# Patient Record
Sex: Female | Born: 1945 | Race: Black or African American | Hispanic: No | Marital: Single | Smoking: Never smoker
Health system: Southern US, Community
[De-identification: ages and names within clinical notes are randomized; demographics above are authoritative.]

## PROBLEM LIST (undated history)

## (undated) DIAGNOSIS — M1990 Unspecified osteoarthritis, unspecified site: Secondary | ICD-10-CM

## (undated) DIAGNOSIS — I10 Essential (primary) hypertension: Secondary | ICD-10-CM

## (undated) DIAGNOSIS — G5603 Carpal tunnel syndrome, bilateral upper limbs: Secondary | ICD-10-CM

## (undated) DIAGNOSIS — B91 Sequelae of poliomyelitis: Secondary | ICD-10-CM

## (undated) DIAGNOSIS — M89669 Osteopathy after poliomyelitis, unspecified lower leg: Secondary | ICD-10-CM

## (undated) DIAGNOSIS — G709 Myoneural disorder, unspecified: Secondary | ICD-10-CM

## (undated) DIAGNOSIS — IMO0002 Reserved for concepts with insufficient information to code with codable children: Secondary | ICD-10-CM

## (undated) DIAGNOSIS — M549 Dorsalgia, unspecified: Secondary | ICD-10-CM

## (undated) DIAGNOSIS — G629 Polyneuropathy, unspecified: Secondary | ICD-10-CM

## (undated) DIAGNOSIS — K219 Gastro-esophageal reflux disease without esophagitis: Secondary | ICD-10-CM

## (undated) DIAGNOSIS — H269 Unspecified cataract: Secondary | ICD-10-CM

## (undated) DIAGNOSIS — H409 Unspecified glaucoma: Secondary | ICD-10-CM

## (undated) DIAGNOSIS — E785 Hyperlipidemia, unspecified: Secondary | ICD-10-CM

## (undated) DIAGNOSIS — M62838 Other muscle spasm: Secondary | ICD-10-CM

## (undated) DIAGNOSIS — G8929 Other chronic pain: Secondary | ICD-10-CM

## (undated) HISTORY — PX: BREAST BIOPSY: SHX20

## (undated) HISTORY — DX: Unspecified osteoarthritis, unspecified site: M19.90

## (undated) HISTORY — PX: HAND SURGERY: SHX662

## (undated) HISTORY — DX: Hyperlipidemia, unspecified: E78.5

## (undated) HISTORY — DX: Osteopathy after poliomyelitis, unspecified lower leg: M89.669

## (undated) HISTORY — PX: COLONOSCOPY: SHX174

## (undated) HISTORY — PX: CHOLECYSTECTOMY: SHX55

## (undated) HISTORY — DX: Essential (primary) hypertension: I10

## (undated) HISTORY — DX: Sequelae of poliomyelitis: B91

## (undated) HISTORY — DX: Myoneural disorder, unspecified: G70.9

## (undated) HISTORY — PX: OTHER SURGICAL HISTORY: SHX169

## (undated) HISTORY — DX: Gastro-esophageal reflux disease without esophagitis: K21.9

## (undated) HISTORY — DX: Carpal tunnel syndrome, bilateral upper limbs: G56.03

## (undated) HISTORY — DX: Reserved for concepts with insufficient information to code with codable children: IMO0002

---
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
## 1978-04-08 HISTORY — PX: ABDOMINAL HYSTERECTOMY: SHX81

## 1997-09-09 ENCOUNTER — Other Ambulatory Visit: Admission: RE | Admit: 1997-09-09 | Discharge: 1997-09-09 | Payer: Self-pay | Admitting: Family Medicine

## 1997-09-12 ENCOUNTER — Other Ambulatory Visit: Admission: RE | Admit: 1997-09-12 | Discharge: 1997-09-12 | Payer: Self-pay | Admitting: Family Medicine

## 1998-12-28 ENCOUNTER — Other Ambulatory Visit: Admission: RE | Admit: 1998-12-28 | Discharge: 1998-12-28 | Payer: Self-pay | Admitting: Family Medicine

## 1999-09-21 ENCOUNTER — Encounter: Payer: Self-pay | Admitting: Family Medicine

## 1999-09-21 ENCOUNTER — Ambulatory Visit (HOSPITAL_COMMUNITY): Admission: RE | Admit: 1999-09-21 | Discharge: 1999-09-21 | Payer: Self-pay | Admitting: Family Medicine

## 1999-11-01 ENCOUNTER — Ambulatory Visit (HOSPITAL_COMMUNITY): Admission: RE | Admit: 1999-11-01 | Discharge: 1999-11-01 | Payer: Self-pay | Admitting: Family Medicine

## 1999-11-01 ENCOUNTER — Encounter: Payer: Self-pay | Admitting: Family Medicine

## 1999-11-27 ENCOUNTER — Encounter: Admission: RE | Admit: 1999-11-27 | Discharge: 1999-12-28 | Payer: Self-pay | Admitting: Orthopedic Surgery

## 2000-05-10 ENCOUNTER — Encounter: Payer: Self-pay | Admitting: Neurosurgery

## 2000-05-10 ENCOUNTER — Ambulatory Visit (HOSPITAL_COMMUNITY): Admission: RE | Admit: 2000-05-10 | Discharge: 2000-05-10 | Payer: Self-pay | Admitting: Neurosurgery

## 2000-05-13 ENCOUNTER — Encounter: Payer: Self-pay | Admitting: Neurosurgery

## 2000-05-13 ENCOUNTER — Ambulatory Visit (HOSPITAL_COMMUNITY): Admission: RE | Admit: 2000-05-13 | Discharge: 2000-05-13 | Payer: Self-pay | Admitting: Neurosurgery

## 2000-05-27 ENCOUNTER — Encounter: Payer: Self-pay | Admitting: Neurosurgery

## 2000-05-27 ENCOUNTER — Ambulatory Visit (HOSPITAL_COMMUNITY): Admission: RE | Admit: 2000-05-27 | Discharge: 2000-05-27 | Payer: Self-pay | Admitting: Neurosurgery

## 2000-06-10 ENCOUNTER — Ambulatory Visit (HOSPITAL_COMMUNITY): Admission: RE | Admit: 2000-06-10 | Discharge: 2000-06-10 | Payer: Self-pay | Admitting: Neurosurgery

## 2000-06-10 ENCOUNTER — Encounter: Payer: Self-pay | Admitting: Neurosurgery

## 2001-10-01 ENCOUNTER — Encounter: Admission: RE | Admit: 2001-10-01 | Discharge: 2001-10-01 | Payer: Self-pay | Admitting: Neurosurgery

## 2001-10-01 ENCOUNTER — Encounter: Payer: Self-pay | Admitting: Neurosurgery

## 2001-10-15 ENCOUNTER — Encounter: Admission: RE | Admit: 2001-10-15 | Discharge: 2001-10-15 | Payer: Self-pay | Admitting: Neurosurgery

## 2001-10-15 ENCOUNTER — Encounter: Payer: Self-pay | Admitting: Neurosurgery

## 2001-10-28 ENCOUNTER — Encounter: Admission: RE | Admit: 2001-10-28 | Discharge: 2001-10-28 | Payer: Self-pay | Admitting: Neurosurgery

## 2001-10-28 ENCOUNTER — Encounter: Payer: Self-pay | Admitting: Neurosurgery

## 2002-03-09 ENCOUNTER — Other Ambulatory Visit: Admission: RE | Admit: 2002-03-09 | Discharge: 2002-03-09 | Payer: Self-pay | Admitting: Family Medicine

## 2004-01-30 ENCOUNTER — Ambulatory Visit: Payer: Self-pay | Admitting: Family Medicine

## 2004-02-14 ENCOUNTER — Ambulatory Visit: Payer: Self-pay | Admitting: Family Medicine

## 2004-02-24 ENCOUNTER — Ambulatory Visit (HOSPITAL_COMMUNITY): Admission: RE | Admit: 2004-02-24 | Discharge: 2004-02-24 | Payer: Self-pay | Admitting: Family Medicine

## 2004-03-14 ENCOUNTER — Ambulatory Visit: Payer: Self-pay | Admitting: Family Medicine

## 2004-04-08 HISTORY — PX: JOINT REPLACEMENT: SHX530

## 2004-04-19 ENCOUNTER — Encounter: Admission: RE | Admit: 2004-04-19 | Discharge: 2004-04-19 | Payer: Self-pay | Admitting: Neurosurgery

## 2004-05-03 ENCOUNTER — Encounter: Admission: RE | Admit: 2004-05-03 | Discharge: 2004-05-03 | Payer: Self-pay | Admitting: Neurosurgery

## 2004-05-21 ENCOUNTER — Ambulatory Visit: Payer: Self-pay | Admitting: Family Medicine

## 2004-05-23 ENCOUNTER — Ambulatory Visit (HOSPITAL_COMMUNITY): Admission: RE | Admit: 2004-05-23 | Discharge: 2004-05-23 | Payer: Self-pay | Admitting: Family Medicine

## 2004-06-05 ENCOUNTER — Encounter: Admission: RE | Admit: 2004-06-05 | Discharge: 2004-07-06 | Payer: Self-pay | Admitting: Family Medicine

## 2004-08-07 ENCOUNTER — Ambulatory Visit: Payer: Self-pay | Admitting: Family Medicine

## 2004-12-24 ENCOUNTER — Inpatient Hospital Stay (HOSPITAL_COMMUNITY): Admission: RE | Admit: 2004-12-24 | Discharge: 2004-12-28 | Payer: Self-pay | Admitting: Orthopedic Surgery

## 2005-01-11 ENCOUNTER — Emergency Department (HOSPITAL_COMMUNITY): Admission: EM | Admit: 2005-01-11 | Discharge: 2005-01-11 | Payer: Self-pay | Admitting: Emergency Medicine

## 2005-01-16 ENCOUNTER — Encounter: Admission: RE | Admit: 2005-01-16 | Discharge: 2005-02-26 | Payer: Self-pay | Admitting: Orthopedic Surgery

## 2005-03-14 ENCOUNTER — Encounter: Admission: RE | Admit: 2005-03-14 | Discharge: 2005-03-26 | Payer: Self-pay | Admitting: Orthopedic Surgery

## 2005-04-23 ENCOUNTER — Ambulatory Visit (HOSPITAL_COMMUNITY): Admission: RE | Admit: 2005-04-23 | Discharge: 2005-04-23 | Payer: Self-pay | Admitting: Neurosurgery

## 2005-05-10 ENCOUNTER — Encounter: Admission: RE | Admit: 2005-05-10 | Discharge: 2005-05-10 | Payer: Self-pay | Admitting: Neurosurgery

## 2005-05-24 ENCOUNTER — Encounter: Admission: RE | Admit: 2005-05-24 | Discharge: 2005-05-24 | Payer: Self-pay | Admitting: Neurosurgery

## 2005-05-28 ENCOUNTER — Ambulatory Visit: Payer: Self-pay | Admitting: Family Medicine

## 2006-01-02 ENCOUNTER — Ambulatory Visit: Payer: Self-pay | Admitting: Family Medicine

## 2006-01-13 ENCOUNTER — Encounter: Admission: RE | Admit: 2006-01-13 | Discharge: 2006-02-06 | Payer: Self-pay | Admitting: Family Medicine

## 2006-03-05 ENCOUNTER — Ambulatory Visit: Payer: Self-pay | Admitting: Family Medicine

## 2006-08-05 ENCOUNTER — Ambulatory Visit: Payer: Self-pay | Admitting: Family Medicine

## 2006-09-30 ENCOUNTER — Ambulatory Visit: Payer: Self-pay | Admitting: Family Medicine

## 2007-04-23 ENCOUNTER — Ambulatory Visit: Payer: Self-pay | Admitting: Family Medicine

## 2007-04-23 LAB — CONVERTED CEMR LAB: Microalb, Ur: 0.89 mg/dL (ref 0.00–1.89)

## 2007-04-30 ENCOUNTER — Ambulatory Visit (HOSPITAL_COMMUNITY): Admission: RE | Admit: 2007-04-30 | Discharge: 2007-04-30 | Payer: Self-pay | Admitting: Family Medicine

## 2007-07-14 ENCOUNTER — Encounter: Admission: RE | Admit: 2007-07-14 | Discharge: 2007-10-01 | Payer: Self-pay | Admitting: Orthopedic Surgery

## 2007-10-23 ENCOUNTER — Ambulatory Visit: Payer: Self-pay | Admitting: Internal Medicine

## 2007-10-30 ENCOUNTER — Ambulatory Visit (HOSPITAL_COMMUNITY): Admission: RE | Admit: 2007-10-30 | Discharge: 2007-10-30 | Payer: Self-pay | Admitting: Family Medicine

## 2008-01-22 ENCOUNTER — Ambulatory Visit: Payer: Self-pay | Admitting: Internal Medicine

## 2008-06-23 ENCOUNTER — Ambulatory Visit: Payer: Self-pay | Admitting: Family Medicine

## 2008-06-24 ENCOUNTER — Encounter (INDEPENDENT_AMBULATORY_CARE_PROVIDER_SITE_OTHER): Payer: Self-pay | Admitting: Family Medicine

## 2008-06-24 LAB — CONVERTED CEMR LAB: Microalb, Ur: 1.74 mg/dL (ref 0.00–1.89)

## 2008-08-02 ENCOUNTER — Ambulatory Visit: Payer: Self-pay | Admitting: Family Medicine

## 2008-08-09 ENCOUNTER — Ambulatory Visit: Payer: Self-pay | Admitting: Gastroenterology

## 2008-08-18 ENCOUNTER — Ambulatory Visit: Payer: Self-pay | Admitting: Gastroenterology

## 2008-09-09 ENCOUNTER — Encounter (INDEPENDENT_AMBULATORY_CARE_PROVIDER_SITE_OTHER): Payer: Self-pay | Admitting: Family Medicine

## 2008-09-09 ENCOUNTER — Other Ambulatory Visit: Admission: RE | Admit: 2008-09-09 | Discharge: 2008-09-09 | Payer: Self-pay | Admitting: Family Medicine

## 2008-09-09 ENCOUNTER — Ambulatory Visit: Payer: Self-pay | Admitting: Family Medicine

## 2008-09-09 LAB — CONVERTED CEMR LAB
ALT: 19 units/L (ref 0–35)
AST: 16 units/L (ref 0–37)
Albumin: 4.4 g/dL (ref 3.5–5.2)
Alkaline Phosphatase: 64 units/L (ref 39–117)
BUN: 12 mg/dL (ref 6–23)
Basophils Absolute: 0 10*3/uL (ref 0.0–0.1)
Basophils Relative: 1 % (ref 0–1)
CO2: 27 meq/L (ref 19–32)
Calcium: 9.9 mg/dL (ref 8.4–10.5)
Chloride: 104 meq/L (ref 96–112)
Cholesterol: 145 mg/dL (ref 0–200)
Creatinine, Ser: 0.8 mg/dL (ref 0.40–1.20)
Eosinophils Absolute: 0.2 10*3/uL (ref 0.0–0.7)
Eosinophils Relative: 3 % (ref 0–5)
Glucose, Bld: 101 mg/dL — ABNORMAL HIGH (ref 70–99)
HCT: 39.8 % (ref 36.0–46.0)
HDL: 57 mg/dL (ref >39)
Hemoglobin: 12.5 g/dL (ref 12.0–15.0)
LDL Cholesterol: 67 mg/dL (ref 0–99)
Lymphocytes Relative: 47 % — ABNORMAL HIGH (ref 12–46)
Lymphs Abs: 2.4 10*3/uL (ref 0.7–4.0)
MCHC: 31.4 g/dL (ref 30.0–36.0)
MCV: 79.3 fL (ref 78.0–100.0)
Monocytes Absolute: 0.3 10*3/uL (ref 0.1–1.0)
Monocytes Relative: 6 % (ref 3–12)
Neutro Abs: 2.2 10*3/uL (ref 1.7–7.7)
Neutrophils Relative %: 43 % (ref 43–77)
Platelets: 292 10*3/uL (ref 150–400)
Potassium: 5 meq/L (ref 3.5–5.3)
RBC: 5.02 M/uL (ref 3.87–5.11)
RDW: 15.4 % (ref 11.5–15.5)
Sodium: 141 meq/L (ref 135–145)
TSH: 1.174 microintl units/mL (ref 0.350–4.500)
Total Bilirubin: 0.4 mg/dL (ref 0.3–1.2)
Total CHOL/HDL Ratio: 2.5
Total Protein: 7.1 g/dL (ref 6.0–8.3)
Triglycerides: 106 mg/dL (ref <150)
VLDL: 21 mg/dL (ref 0–40)
Vit D, 25-Hydroxy: 12 ng/mL — ABNORMAL LOW (ref 30–89)
WBC: 5 10*3/uL (ref 4.0–10.5)

## 2008-09-09 LAB — HM PAP SMEAR: HM Pap smear: NORMAL

## 2009-02-28 LAB — HM MAMMOGRAPHY: HM Mammogram: NORMAL

## 2009-03-28 ENCOUNTER — Ambulatory Visit: Payer: Self-pay | Admitting: Family Medicine

## 2009-03-28 LAB — CONVERTED CEMR LAB
BUN: 10 mg/dL (ref 6–23)
CO2: 27 meq/L (ref 19–32)
Calcium: 10.1 mg/dL (ref 8.4–10.5)
Chloride: 104 meq/L (ref 96–112)
Cholesterol: 135 mg/dL (ref 0–200)
Creatinine, Ser: 0.71 mg/dL (ref 0.40–1.20)
Glucose, Bld: 91 mg/dL (ref 70–99)
HDL: 58 mg/dL (ref >39)
LDL Cholesterol: 57 mg/dL (ref 0–99)
Potassium: 4.8 meq/L (ref 3.5–5.3)
Sodium: 141 meq/L (ref 135–145)
Total CHOL/HDL Ratio: 2.3
Triglycerides: 99 mg/dL (ref <150)
VLDL: 20 mg/dL (ref 0–40)
Vit D, 25-Hydroxy: 28 ng/mL — ABNORMAL LOW (ref 30–89)

## 2009-05-26 ENCOUNTER — Ambulatory Visit: Payer: Self-pay | Admitting: Family Medicine

## 2009-10-20 ENCOUNTER — Ambulatory Visit: Payer: Self-pay | Admitting: Family Medicine

## 2009-10-20 LAB — CONVERTED CEMR LAB: Microalb, Ur: 1.69 mg/dL (ref 0.00–1.89)

## 2009-10-25 ENCOUNTER — Ambulatory Visit: Payer: Self-pay | Admitting: Family Medicine

## 2010-01-05 ENCOUNTER — Emergency Department (HOSPITAL_COMMUNITY): Admission: EM | Admit: 2010-01-05 | Discharge: 2010-01-05 | Payer: Self-pay | Admitting: Emergency Medicine

## 2010-01-12 ENCOUNTER — Encounter: Admission: RE | Admit: 2010-01-12 | Discharge: 2010-01-12 | Payer: Self-pay | Admitting: Neurosurgery

## 2010-02-21 ENCOUNTER — Encounter
Admission: RE | Admit: 2010-02-21 | Discharge: 2010-04-04 | Payer: Self-pay | Source: Home / Self Care | Attending: Orthopedic Surgery | Admitting: Orthopedic Surgery

## 2010-03-28 ENCOUNTER — Encounter: Admission: RE | Admit: 2010-03-28 | Payer: Self-pay | Source: Home / Self Care | Admitting: Orthopedic Surgery

## 2010-04-09 ENCOUNTER — Encounter: Admit: 2010-04-09 | Payer: Self-pay | Admitting: Orthopedic Surgery

## 2010-04-11 ENCOUNTER — Encounter
Admission: RE | Admit: 2010-04-11 | Discharge: 2010-05-08 | Payer: Self-pay | Source: Home / Self Care | Attending: Orthopedic Surgery | Admitting: Orthopedic Surgery

## 2010-04-25 ENCOUNTER — Encounter: Admit: 2010-04-25 | Payer: Self-pay | Admitting: Orthopedic Surgery

## 2010-04-26 ENCOUNTER — Encounter (INDEPENDENT_AMBULATORY_CARE_PROVIDER_SITE_OTHER): Payer: Self-pay | Admitting: Family Medicine

## 2010-04-26 LAB — CONVERTED CEMR LAB
Calcium: 10 mg/dL (ref 8.4–10.5)
Cholesterol: 166 mg/dL (ref 0–200)
Glucose, Bld: 97 mg/dL (ref 70–99)
HDL: 52 mg/dL (ref >39)
LDL Cholesterol: 88 mg/dL (ref 0–99)
Potassium: 4.4 meq/L (ref 3.5–5.3)
Sodium: 139 meq/L (ref 135–145)
Total CHOL/HDL Ratio: 3.2
Triglycerides: 128 mg/dL (ref <150)
VLDL: 26 mg/dL (ref 0–40)

## 2010-04-26 LAB — HEMOGLOBIN A1C: Hgb A1c MFr Bld: 6.6 % — AB (ref 4.0–6.0)

## 2010-04-26 LAB — BASIC METABOLIC PANEL
BUN: 9 mg/dL (ref 4–21)
Glucose: 97 mg/dL

## 2010-06-20 ENCOUNTER — Inpatient Hospital Stay (INDEPENDENT_AMBULATORY_CARE_PROVIDER_SITE_OTHER)
Admission: RE | Admit: 2010-06-20 | Discharge: 2010-06-20 | Disposition: A | Discharge status: Home / Self Care | Payer: Medicare HMO | Source: Ambulatory Visit | Attending: Family Medicine | Admitting: Family Medicine

## 2010-06-20 DIAGNOSIS — M67919 Unspecified disorder of synovium and tendon, unspecified shoulder: Secondary | ICD-10-CM

## 2010-07-17 LAB — GLUCOSE, CAPILLARY
Glucose-Capillary: 118 mg/dL — ABNORMAL HIGH (ref 70–99)
Glucose-Capillary: 124 mg/dL — ABNORMAL HIGH (ref 70–99)

## 2010-08-01 ENCOUNTER — Ambulatory Visit (INDEPENDENT_AMBULATORY_CARE_PROVIDER_SITE_OTHER): Payer: Medicaid Other | Admitting: Family Medicine

## 2010-08-01 ENCOUNTER — Encounter: Payer: Self-pay | Admitting: Family Medicine

## 2010-08-01 VITALS — BP 123/74 | HR 69 | Temp 98.2°F | Ht 63.0 in | Wt 222.0 lb

## 2010-08-01 DIAGNOSIS — I10 Essential (primary) hypertension: Secondary | ICD-10-CM

## 2010-08-01 DIAGNOSIS — E119 Type 2 diabetes mellitus without complications: Secondary | ICD-10-CM

## 2010-08-01 DIAGNOSIS — M549 Dorsalgia, unspecified: Secondary | ICD-10-CM

## 2010-08-01 DIAGNOSIS — M25519 Pain in unspecified shoulder: Secondary | ICD-10-CM

## 2010-08-01 DIAGNOSIS — E785 Hyperlipidemia, unspecified: Secondary | ICD-10-CM

## 2010-08-01 DIAGNOSIS — M25512 Pain in left shoulder: Secondary | ICD-10-CM

## 2010-08-01 MED ORDER — TRAMADOL HCL 50 MG PO TABS: 50.0000 mg | ORAL_TABLET | Freq: Two times a day (BID) | ORAL | Status: AC | PRN

## 2010-08-01 MED ORDER — OMEPRAZOLE 20 MG PO CPDR: 20.0000 mg | DELAYED_RELEASE_CAPSULE | Freq: Two times a day (BID) | ORAL | Status: DC

## 2010-08-01 MED ORDER — NAPROXEN 500 MG PO TABS: 500.0000 mg | ORAL_TABLET | Freq: Two times a day (BID) | ORAL | Status: DC

## 2010-08-01 NOTE — Progress Notes (Signed)
  Subjective:    Patient ID: [name], female    DOB: [birth year], 65 y.o.   MRN: [id number]  HPI  Previously at [facility] with Dr. Garnett Farm , pt insurance was changed and she no longer can be seen there. Here to establish today as a pt. Concerns: Bilateral shoulder pain, medication refills, will discuss other problems at next visit          Diabetes Mellitus- Takes CBG twice a day when she remembers, tolerating Metformin, does not not her last A1C, had blood work in Jan with previous PCP. Taking ACE as prescribed, has had a few low CBG near 70, no CBG > 200.  HTN- Tolerating BP meds, no recent CP, SOB  Shoulder pain- bilateral shoulder pain, L > R x 3 months, aching sensation over both deltoid region,sharp pain that radiates from biceps groove upward No specific injury  Concerned she had overuse of the left arm, when she was rehab for her right hand s/p fall in Oct 2011 Seen was at [facility] at [facility] told probable rotator cuff injury- given Naproxen for 10 days Pain with reaching above, able to dress herself without significant  Pain, feels week in both arms but weaker on left side, has some pins and needles in hands but feels this is mostly her carpal tunnel.           Medication rec done - needs refill on Prilosec    Review of Systems - per above     Objective:   Physical Exam   GEN- NAD, alert and oriented, pleasant, obese, walks with right limp, cane   CVS- RRR, no murmur  Neck supple , normal ROM  RESP- CTAB, normal effort  MSK:-   Shoulder: Inspection reveals no abnormalities, atrophy or asymmetry. Bilaterally  TTP over left bicipital groove. Decreased ROM with extension above head, discomfort with lateral motion on left side Positive empty can on left side Positive neers positive Hawkins Gross weakness on Left UE 4/5 compared to right. Mild discomfort with external rotation with rotator cuff testing + speeds, neg yeargerson equivical Obrien's, negative clunk and  good stability. Normal scapular function observed. No painful arc and no drop arm sign.    Pulses 2+ radial Skin- well healed  Linear surgical scar Right wrist    Assessment & Plan:

## 2010-08-01 NOTE — Patient Instructions (Addendum)
I will send a referral to the sports medicine center for your shoulder - I am concerned for bursitis and possible small tear in shoulder  Stop the diclofenac Start there naprosyn twice a day Use the pain medication- Ultram as needed I will obtain medical records [facility] Next visit in 3-4 weeks  Call to see what meter you need for your diabetes

## 2010-08-03 ENCOUNTER — Encounter: Payer: Self-pay | Admitting: Family Medicine

## 2010-08-03 DIAGNOSIS — E785 Hyperlipidemia, unspecified: Secondary | ICD-10-CM | POA: Insufficient documentation

## 2010-08-03 DIAGNOSIS — H409 Unspecified glaucoma: Secondary | ICD-10-CM | POA: Insufficient documentation

## 2010-08-03 DIAGNOSIS — M5410 Radiculopathy, site unspecified: Secondary | ICD-10-CM | POA: Insufficient documentation

## 2010-08-03 DIAGNOSIS — M79602 Pain in left arm: Secondary | ICD-10-CM | POA: Insufficient documentation

## 2010-08-03 DIAGNOSIS — E1142 Type 2 diabetes mellitus with diabetic polyneuropathy: Secondary | ICD-10-CM | POA: Insufficient documentation

## 2010-08-03 DIAGNOSIS — I10 Essential (primary) hypertension: Secondary | ICD-10-CM | POA: Insufficient documentation

## 2010-08-03 NOTE — Assessment & Plan Note (Signed)
Concern for bursitis and possible small slow healing rotator cuff tear. Will change pt NSAID, start Ultram Send to [facility] for ultrasound

## 2010-08-03 NOTE — Assessment & Plan Note (Signed)
Obtain records from previous PCP Maintained on [facility] Pt to call back with the name of a new meter that her insurance will cover, all the ones I offered she stated they did not cover

## 2010-08-03 NOTE — Assessment & Plan Note (Signed)
Continue current meds 

## 2010-08-09 ENCOUNTER — Other Ambulatory Visit: Payer: Medicare HMO | Admitting: Family Medicine

## 2010-08-10 ENCOUNTER — Encounter: Payer: Self-pay | Admitting: Family Medicine

## 2010-08-10 ENCOUNTER — Ambulatory Visit (INDEPENDENT_AMBULATORY_CARE_PROVIDER_SITE_OTHER): Payer: Medicare HMO | Admitting: Family Medicine

## 2010-08-10 DIAGNOSIS — M25519 Pain in unspecified shoulder: Secondary | ICD-10-CM

## 2010-08-10 DIAGNOSIS — S46219A Strain of muscle, fascia and tendon of other parts of biceps, unspecified arm, initial encounter: Secondary | ICD-10-CM

## 2010-08-10 DIAGNOSIS — S43499A Other sprain of unspecified shoulder joint, initial encounter: Secondary | ICD-10-CM

## 2010-08-10 MED ORDER — NITROGLYCERIN 0.1 MG/HR TD PT24: MEDICATED_PATCH | TRANSDERMAL | Status: DC

## 2010-08-10 NOTE — Progress Notes (Signed)
Subjective:    Patient ID: [name], female    DOB: 10-Nov-1945, 65 y.o.   MRN: [id number]  HPI 65 year old right-hand-dominant female to office for evaluation of bilateral shoulder pain that has been ongoing for the past 3 months. Pain is worse in the left  compared to the right. Pain is typically anterior lateral in position and described as an ache, but occasional sharp pain anteriorly. She has increased pain with overhead activity and trying to lift items. She has occasional nighttime pain. She denies any neck pain. She denies any injury or trauma to her shoulders, although did have a fall in October 2011 where sustained a right wrist fracture requiring ORIF by Dr. Mina Marble.  Has been evaluated at urgent care but that she had a rotator cuff injury, she was referred to [place] by her PCP for ultrasound of the shoulders. Has been taking Naprosyn and tramadol which have been helpful. Denies any previous injury to her shoulders. Occasional numbness and tingling in the fingers of her left hand, has history of carpal tunnel.  Past Medical History  Diagnosis Date  . Diabetes mellitus   . GERD (gastroesophageal reflux disease)   . Hyperlipidemia   . Hypertension   . 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 Arthritis     osteoarthritis  . Back pain, chronic     per pt disc disease, disc herniation- Followed by Dr. Mikal Plane  . Carpal tunnel syndrome on both sides     Followed by Dr. Mina Marble  . Glaucoma   . Trigger finger   . Polio osteopathy of lower leg 1948   Past Surgical History  Procedure Date  . Abdominal hysterectomy   . Cholecystectomy   . Hand surgery     Right wrist, s/p plate removal in Feb 2012  . Joint replacement 2006    left knee    Review of Systems  per history of present illness, otherwise negative     Objective:   Physical Exam GEN: NAD, alert and oriented, pleasant, obese, walks with assistance of a cane SKIN: No rashes or  lesions RESP: Respirations 15 and unlabored MSK: - Neck: FROM without pain.  No midline or paraspinal tenderness.  Neg Spurlings b/l - Shoulders:  Inspection reveals no abnormalities, atrophy or asymmetry.  Near FROM with exception of decreased IR to L4 bilaterally, (+)painful arch on left.  Extremely TTP over left bicipital groove & AC joint, minimal TTP over right bicipital groove.  (+) empty can, (+)Hawkins, (+)Neer, (+)Speeds & [place] on left.  (-) Obriens, (-) Clunk, no instability.  No drop arm.  Mild weakness of left RTC in abduciton & ER.  Rt shoulder with neg empty can, mildly (+) Neer, neg Hawkins, neg Obriens, no instability.  Normal RTC strength on the right.  No scapular dyskinesia. NEURO: sensation intact to light touch, DTR +2/4 bicep, tricep, brachioradialis VASC: pulse +2/4 & symmetric radial artery b/l  MSK U/S:  - Left shoulder: Bicep tendon with hypoechoic change & surrounding edema with (+)target sign, increased doppler flow & neovessels noted; visualized in both long & short views - finding consistent with partial bicep tendon tear.  Normal appearing subscap, normal appearing supraspinatus, normal appearing infraspinatus.  Mild DJD at [facility] joint.  No dynamic impingement. - Right shoulder: Normal appearing bicep.  Small calcification noted along articular surface of subscap, no increased doppler flow, possibly old partial RTC tear.  Normal appearing supraspinatus.  Normal appearing infraspinatus.  No dynamic impingement.  Mild DJD at [facility]. - Images saved.       Assessment & Plan:

## 2010-08-10 NOTE — Patient Instructions (Signed)
Shoulder exercises:  1) pendulum exercises - clockwise and counter-clockwise both arms twice a day  2) wall crawl - both arms twice a day  3) biceps exercises left arm only - 1-2 lbs down slow up fast. Do 3 sets of 15 for several weeks and if no pain at that time advance to 3 sets of 30   Nitroglycerin patch: cut patch in half and apply to tender area of shoulder. Leave patch on for 24hrs then remove and replace with another one. If you develop a headache you can take Tylenol.  Most common side effect is headache - which if occurs will usually in first 2-weeks of treatment.  May also get a rash.  Continue to take Ultram and Naproxen as directed  Follow-up in 6-weeks for repeat ultrasound

## 2010-08-10 NOTE — Assessment & Plan Note (Signed)
-   Bilateral shoulder pain with partial biceps tear on left and suspected rotator cuff strain on the right. - Relatively normal MSK ultrasound on right today with exception of a.c. joint DJD and possible old rotator cuff tear of the supraspinatus. - Start pendulum exercises and wall crawls for both shoulders, plan to advance to strengthening exercises in the future - Continue naproxen and Ultram as prescribed - Followup 6 weeks for reevaluation

## 2010-08-10 NOTE — Assessment & Plan Note (Addendum)
-   Small partial tearing of proximal left biceps tendon with increased Doppler flow and neovessels noted on MSK ultrasound today - Patient's symptoms have been ongoing for 3 months, will start her on nitroglycerin protocol, explained common side effects including rash/headache - She should start bicep exercises with light weight as described in pt instructions -  Continue naproxen and Ultram as needed as prescribed by PCP - Followup 6 weeks for repeat ultrasound and reevaluation

## 2010-08-15 ENCOUNTER — Encounter: Payer: Self-pay | Admitting: Family Medicine

## 2010-08-20 ENCOUNTER — Encounter: Payer: Self-pay | Admitting: Family Medicine

## 2010-08-24 NOTE — Discharge Summary (Signed)
[name], [name] NO.:  [id number]   MEDICAL RECORD NO.:  [id number]          PATIENT TYPE:  INP   LOCATION:  5017                         FACILITY:  [facility]   PHYSICIAN:  [name], M.D. DATE OF BIRTH:  05-12-1945   DATE OF ADMISSION:  12/24/2004  DATE OF DISCHARGE:  12/28/2004                                 DISCHARGE SUMMARY   ADMISSION DIAGNOSIS:  1.  End stage osteoarthritis of the left knee.  2.  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  3.  Diabetes mellitus.  4.  Hypertension, uncontrolled.   DISCHARGE DIAGNOSIS:  1.  Status post left total knee arthroplasty.  2.  Acute blood loss anemia secondary to surgery, asymptomatic.  3.  Postop pyrexia with negative workup.  4.  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  5.  Diabetes mellitus.  6.  Hypertension, uncontrolled.   HISTORY OF PRESENT ILLNESS:  [name] is a 65 year old African American  female with a history of post polio syndrome with problems with her left  knee.  The patient states that over the past three years, she has had  progressively worsening pain in the left knee.  She describes the pain as a  sharp, occasional stabbing pain.  She has typically transitioning from  sitting to standing position.  Pain is in the medial aspect of the left knee  with radiation down the tibia.  Mechanical symptoms positive for locking and  giving way.  She does have night pain.  X-rays of the left knee show bone on  bone medial compartment with large osteophytes along the femoral tibial  components of the left knee.   ALLERGIES:  No known drug allergies.   CURRENT MEDICATIONS:  Famotidine 20 mg p.o. b.i.d., Altace 2.5 mg daily,  Metformin 500 mg p.o. b.i.d., Clarinex 5 mg p.o. daily, Nasonex 2 sprays  each nostril q.h.s. p.r.n., Norflex 100 mg b.i.d.   SURGICAL PROCEDURE:  The patient was taken to the operating room December 24, 2004, by Dr. Georgena Perez  assisted by [name], P.A.-C.  The patient  was placed under general anesthesia and received a preop femoral nerve  block.  The patient then underwent a left total knee arthroplasty using the  following components, size E femoral component, size 3 tibial component, 10  mm bearing, and an all poly 35 mm thickness patella.  The patient tolerated  the procedure well and returned to the recovery room in good, stable  condition.   CONSULTATIONS:  PT, OT, case management.   HOSPITAL COURSE:  Postop day one, patient afebrile, vital signs stable, H&H  10 and 29.9.  Patient felt sleepy secondary to pain medications but had no  symptoms of anemia.  PCA was discontinued.  The patient was started on oral  pain medications.  Postop day two, T-max 101.4, blood pressure 149/91, heart  rate 109, respiratory rate 20, 97% O2 saturation on room air.  H&H 9.8 and  29.  Patient complained of poor pain control and OxyContin was started.  The  patient denied shortness of breath, nausea and vomiting, chest  pain.  The  patient seemed much more alert on postop day two compared to postop day one.  The patient's white count was 11,100, aggressive pulmonary toilet was  instituted.  Postop day three, patient with better pain control.  Denied  chest pain, shortness of breath, nausea, vomiting, dysuria.  No dizziness or  symptoms of anemia.  H&H 9.4 and 28.4, white count 10,400.  The patient with  T-max 103, otherwise, vital signs stable.  Chest x-ray was obtained, UA  obtained, blood cultures obtained.  Continued aggressive pulmonary toilet.  Postop day four, patient T-max 102.7, white count dropped to 7200, UA  negative, chest x-ray no active disease, blood cultures pending.  On  examination, chest clear to auscultation bilaterally, some very mild  wheezing noted.  At the point of morning rounds, physical exam was unable to  be performed due to the fact patient on bedpan.  Patient's exam done later  that  afternoon, at that time, temperature 99.7.  The patient was to remain  hospitalized until 16 hours and if she remained afebrile, was to be  discharged.  The patient was discharged home on December 28, 2004.   LABORATORY DATA:  Routine CBC on admission December 18, 2004, white count  6,300, hemoglobin 12.7, hematocrit 38.3, platelets 289.  Coags on December 18, 2004, protime 12.9, INR 1, PTT 36.  Routine chemistries on December 18, 2004, sodium 139, potassium 4.2, chloride 107, bicarb 27, glucose 95, BUN  low at 5, creatinine 0.8, calcium 9.6.  Routine hepatic enzymes on admission  reveals all values within normal limits.  Urinalysis on admission December 18, 2004, was negative.  Repeat urinalysis on December 27, 2004, negative.  Blood cultures obtained December 27, 2004, negative x 5 days.   DISCHARGE MEDICATIONS:  The patient is to resume home meds and add the  following:  Iron 325 1 tab daily x 28 days, Percocet 5/325 1-2 tablets q.4-  6h. p.r.n. pain, OxyContin 10 mg SR 1 tablet q.12h. pain, Lovenox 40 mg 1  injection daily at 10 a.m. for ten days.   DISCHARGE INSTRUCTIONS:  Wound care:  The patient is to keep the wound clean  and dry.  The patient is to call the office for temperature greater than  101.5, pain not controlled, or any signs of infection.  Change dressing  daily.  The patient may shower after two days if no drainage.  Weight-  bearing as tolerated with a walker.  Diet:  Modified carb diet.  Special  instructions:  CPM 0 to 90 degrees 6-8 hours a day, increase by 10 degrees  daily.  Home Health PT per [facility].  Follow up:  The patient needs to follow  up with Dr. Sherlean Perez approximately ten days from discharge, the patient is to  call the office at [phone] for appointment.   CONDITION ON DISCHARGE:  The patient was discharged to home in good, stable  condition.      [name], P.A.   ______________________________  [name], M.D.    GC/MEDQ   D:  03/08/2005  T:  03/08/2005  Job:  [id number]

## 2010-08-24 NOTE — Op Note (Signed)
[name], [name] NO.:  [id number]   MEDICAL RECORD NO.:  [id number]          PATIENT TYPE:  INP   LOCATION:  2869                         FACILITY:  [facility]   PHYSICIAN:  Mila Homer. Sherlean Foot, M.D. DATE OF BIRTH:  [birth year]   DATE OF PROCEDURE:  12/24/2004  DATE OF DISCHARGE:                                 OPERATIVE REPORT   PREOPERATIVE DIAGNOSIS:  Left knee osteoarthritis.   POSTOPERATIVE DIAGNOSIS:  Left knee osteoarthritis.   OPERATION PERFORMED:  Left total knee arthroplasty.   SURGEON:  Mila Homer. Sherlean Foot, M.D.   ASSISTANT:  Legrand Pitts. Duffy, P.A.   ANESTHESIA:  General.   INDICATIONS FOR PROCEDURE:  The patient is a 65 year old black female with  failure of conservative measures for osteoarthritis of the knee.  Informed  consent was obtained.   DESCRIPTION OF PROCEDURE:  The patient was taken to the operating room and  administered general anesthesia.  The left lower extremity was prepped and  draped in the usual sterile fashion.  The extremity was exsanguinated with  the Esmarch bandage and the tourniquet inflated to 375 mmHg.  A  #10 blade  was used to make a midline incision 12 cm in length.  A fresh blade was used  to make a median parapatellar arthrotomy and to elevate the deep MCL off the  medial crest of the tibia.  I then everted the patella, measured at 25 mm  thick.  I used a 32 mm to ream down to 15 mm thick.  I then cleaned off the  patella with a sagittal saw, removed the excess bone and drilled for the lug  holes.  With the prosthetic trial in place, I repaired it to a 25 mm  thickness.  I removed the prosthetic trial and went into flexion.  I used  the extramedullary alignment system to make a perpendicular cut to the  anatomic axis of the tibia removing 2 mm of bone off the medial side which  was the low side.  I then removed the excess bone and the extramedullary  guide.  I then made an intramedullary drill hole in the femur.  I put  the  intramedullary guide up the femoral canal after suctioning it.  This was set  on 6 degree valgus cut.  I then pinned the distal femoral cutting block into  place and made the distal femoral cut with a sagittal saw.  I then removed  that cutting block and I measured the posterior condylar axis.  The  posterior condylar angle measured 3 degrees.  I sized to a size E and pinned  it through the 3 degree external rotation holes.  At this point I then  finished the femur with a 4 in 1 cutting block making anterior and posterior  chamfer cuts.  I then placed a lamina spreader in the knee, removed the ACL,  PCL, medial and lateral menisci and posterior condylar osteophytes.  I then  placed a 10 mm spacer block in the knee and had an excellent flexion and  extension gap balance.  I then put a scoop retractor behind the  posterior  condyles of the femur in 90 degrees of flexion and finished the femur with a  size E finishing block, saw and lug drill.  I then finished the tibia with a  size 4 tibial tray, drill and keel.  I then trialed with a size 4 tibia,  size E femur, size 10 insert, size 35 patella, flexion extension gap balance  was excellent.  Drop and dangle was back to 110 degrees limited only by the  thigh/calf ratio.  The patella tracked well.  I removed the trial components  and copiously irrigated.  I then cemented in the tibia first, femur second,  removed excess cement, snapped in the real polyethylene, located the knee  and put it in extension.  I then cemented in the patella and removed excess  cement.  When the cement was hard, I let the tourniquet down.  This was at  54 minutes.  I then lavaged copiously and left the Hemovac deep to the  arthrotomy.  I closed the arthrotomy with interrupted figure-of-eight #1  Vicryl sutures, deep soft tissue with interrupted 0 Vicryl sutures and  subcuticular 2-0 Vicryl stitch and skin staples.  The patient tolerated the  procedure well. The  estimated blood loss for this procedure was 300 mL.   COMPLICATIONS:  None.   DRAINS:  One Hemovac.           ______________________________  Mila Homer. Sherlean Foot, M.D.     SDL/MEDQ  D:  12/24/2004  T:  12/24/2004  Job:  [id number]

## 2010-08-24 NOTE — H&P (Signed)
[name], [name]           ACCOUNT NO.:  [id number]   MEDICAL RECORD NO.:  [id number]          PATIENT TYPE:  INP   LOCATION:  NA                           FACILITY:  [facility]   PHYSICIAN:  Mila Homer. Sherlean Foot, M.D. DATE OF BIRTH:  1946/03/09   DATE OF ADMISSION:  12/24/2004  DATE OF DISCHARGE:                                HISTORY & PHYSICAL   CHIEF COMPLAINT:  Left knee pain.   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  The  patient states that over the last three years, she has had progressively  worsening of sharp occasional stabbing pain with her knee giving out on the  left.  She does have difficulty with transitioning from sitting to standing.  The pain is located primarily along the medial aspect of the knee with  radiation down into the tibia.  She does have mechanical grinding, popping,  locking and the sensation that the knee wants to give out.  She does have  swelling.  She does have night pain.  The patient has post Polio muscle  weakness in her right knee.   X-rays revealed bone-on-bone medial compartment with large osteophytes along  the femoral and tibial components of the left knee.   ALLERGIES:  NO KNOWN DRUG ALLERGIES.   CURRENT MEDICATIONS:  1.  Gabapentin 400 mg p.o. t.i.d.  2.  Famotidine 20 mg p.o. b.i.d.  3.  Altace 2.5 mg p.o. daily.  4.  Metformin 500 mg p.o. b.i.d.  5.  ____________ 100 mg p.o. b.i.d.  6.  Clarinex 5 mg p.o. daily.  7.  Nasonex two sprays each nostril q.h.s. p.r.n.   PAST MEDICAL HISTORY:  1.  Diabetes.  2.  Hypertension, poorly controlled.  Has not taken blood pressure medicines      over the last three days.  Has called for refills.  It not getting any      response.  3.  Post Polio syndrome lower extremity, pain and weakness of the right.   PAST SURGICAL HISTORY:  Right ankle surgery for her Polio, left knee surgery  for  her Polio, cholecystectomy and hysterectomy.  The patient denies any  complications to the above-mentioned surgical procedure.   SOCIAL HISTORY:  This is a 65 year old black female with a significant  history of post Polio syndrome, appears to be slightly obese, denies any  history of smoking or alcohol use.  She is single.  Several grown children.  She lives in a one story house by herself and she is disabled.   Family physician is Dr. Dow Adolph at [phone].   FAMILY MEDICAL HISTORY:  Mother is decreased from a heart attack.  Father is  decreased from kidney failure.  One sister decreased from complications of  diabetes and renal failure.  Two brothers alive and one with diabetes.  The  other in good health.  Two sisters alive, one with renal failure, the second  one with diabetes.   REVIEW OF SYSTEMS:  Positive for glasses at all times.  She does have  shortness of breath with exertion due to her deconditioning, physical  conditioning and post Polio syndrome.  She denies any cardiac-related  symptoms.  She does have reflux on occasion and occasional increased urinary  frequency.  Otherwise, all review of systems are negative and  noncontributory.   PHYSICAL EXAMINATION:  VITAL SIGNS:  Height is 5 feet 4 inches, weight is  134 pounds, blood pressure is 198/112, pulse of 88 and regular, respirations  are 12, the patient is afebrile.  GENERAL:  This is a short in stature, slightly heavyset black female,  ambulates with a cane with her right leg with a significant sway from right  to left, does require assistance to get on and off the exam table but  appears to be in no acute distress.  HEENT:  Head was normocephalic.  Pupils equal, round, reactive.  Extraocular  movements intact.  Sclerae is not icteric.  Conjunctivae is pink and moist.  External ears were without deformities.  Gross hearing is intact.  NECK:  Supple.  No palpable lymphadenopathy.  Thyroid region was  nontender.  She had good range of motion of her cervical spine.  CHEST:  Lung sounds were clear and equal bilaterally.  No wheezes, rales or  rhonchi.  HEART:  A regular rate and rhythm.  No murmurs, rubs or gallops.  ABDOMEN:  Round, obese, soft and nontender.  CVA region nontender.  EXTREMITIES:  Upper extremities were symmetrical in size and shape.  She had  good range of motion of her shoulders, elbows and wrists and motor strength  was 5/5.  LOWER EXTREMITIES:  Right leg had full extension.  She was unable to pull it  up passed 5 degrees just due to extreme muscle weakness.  She was unable to  extend her knee passed about maybe 10 or 20 degrees of her lower extremity  due to extension muscle weakness.  She had weakness in the ankle.  She had  good sensation throughout the right lower extremity.  Left knee had a couple  of well-healed surgical incisions.  She was extremely tender along the  medial joint line.  She had coarse crepitus along the knee with full  extension on flexion back to 100 degrees.  She had significant valgus varus  laxity.  Calf was nontender.  The ankle was symmetrical but she did have  some good dorsi plantar flexion.  She did have full extension of her left  hip.  She had 20 degrees of internal and external rotation.  She was able to  flex it up to about 120 degrees.  PERIPHERAL VASCULAR:  Carotid pulses were 2+, no bruits.  Radial pulses were  2+.  Dorsalis pedis pulses were 1+.  She had trace edema in the lower  extremities but no venous stasis changes.  NEUROLOGICAL:  The patient was conscious, alert and appropriate.  Ease of  conversation with the examiner.  Cranial nerves II-XII were grossly intact.  The only gross neurologic defect that she had was gross muscle weakness in  her right lower extremity.  She had good sensation.  BREASTS/RECTAL/GENITOURINARY:  Exams were deferred at this time.   IMPRESSION: 1.  End-stage osteoarthritis, left knee.  2.   Bone-on-bone medial compartment.  3.  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  4.  Diabetes.  5.  Hypertension, uncontrolled.   PLAN:  I have attempted to call Dr. Angelyn Punt office multiple times,  unable to get through at this  particular point.  Will continue trying to  call to discuss the patient's blood pressure issues and medications.  Otherwise, the patient  will undergo all other routine laboratories and tests prior to having a left  total knee arthroplasty with Dr. Sherlean Foot at [facility] on December 24, 2004.  I have also asked the patient to also try to contact Dr.  Angelyn Punt office about her medications and her blood pressure issues for  evaluation.      Jamelle Rushing, P.A.    ______________________________  Mila Homer. Sherlean Foot, M.D.    RWK/MEDQ  D:  12/12/2004  T:  12/12/2004  Job:  [id number]

## 2010-09-27 ENCOUNTER — Ambulatory Visit (INDEPENDENT_AMBULATORY_CARE_PROVIDER_SITE_OTHER): Payer: Medicare HMO | Admitting: Family Medicine

## 2010-09-27 DIAGNOSIS — M25519 Pain in unspecified shoulder: Secondary | ICD-10-CM

## 2010-09-27 DIAGNOSIS — S46219A Strain of muscle, fascia and tendon of other parts of biceps, unspecified arm, initial encounter: Secondary | ICD-10-CM

## 2010-09-27 DIAGNOSIS — S43499A Other sprain of unspecified shoulder joint, initial encounter: Secondary | ICD-10-CM

## 2010-09-27 NOTE — Assessment & Plan Note (Signed)
Left biceps tendon partial tear which is improving on MSK ultrasound, but does still have large amounts of fluid surrounding the tendon. - Will continue with nitroglycerin patch as she is doing - Should continue her biceps eccentric exercises - Continue naproxen and tramadol as needed - Followup 4-6 weeks for reevaluation

## 2010-09-27 NOTE — Assessment & Plan Note (Signed)
-   Right shoulder pain has significantly improved, feel this was compensatory secondary to her left shoulder pain. - Left shoulder still with significant pain, only 10% improved over all, but clinically does show improvements in strength and range of motion. MSK ultrasound showed possible small tear in supraspinatus and subscap was not visualized last office visit    - Will continue with nitroglycerin protocol as she is doing    - Start shoulder strengthening exercises with yellow therapy and as shown    - Subacromial corticosteroid injection was completed in the office today to help with her pain      Consent obtained and verified. Sterile betadine prep. Furthur cleansed with alcohol. Topical analgesic spray: Ethyl chloride. Joint: Left shoulder - subacromial  Approached in typical fashion with: Posterior approach Completed without difficulty Meds: 4 cc lidocaine 1% +1 cc Kenalog 40 mg/cc Needle: 1.5 inch 25-gauge Aftercare instructions and Red flags advised. Tolerated procedure well without any complications. The advise may temporarily raise her blood sugars over the next several days. - Followup 4-6 weeks for reevaluation

## 2010-09-27 NOTE — Progress Notes (Signed)
  Subjective:    Patient ID: [name], female    DOB: 15-Dec-1945, 65 y.o.   MRN: [id number]  HPI 65 year old female to office for followup of bilateral shoulder pain and left partial biceps tear. Right shoulder is greatly improved, only having intermittent pain at this time. Left shoulder is approximately 10% better. Has been using nitroglycerin and denies any adverse side effects. Has been doing biceps exercises as directed. Pain is still primarily anterior, but is starting to have some more posterior lateral pain as well. Is having nighttime pain. Denies any numbness or tingling. Has been taking naproxen and tramadol as needed   Review of Systems Per history of present illness, otherwise negative    Objective:   Physical Exam GEN: NAD, alert and oriented, pleasant, obese, walks with assistance of a cane  MSK:  - Neck: FROM without pain. No midline or paraspinal tenderness. Neg Spurlings b/l  - Shoulders: R shoulder: Full range of motion without pain. No tenderness at the bicipital groove or a.c. joint. Good rotator cuff strength. Negative Hawkins, negative Neer, negative speed, negative Yergason. Negative empty can  Left shoulder: Full range of motion with slight pain with abduction above 90 and with internal rotation. Still with tenderness over the bicipital groove and a.c. joint. Positive speed's, positive Yergason, positive empty can, negative Hawkins and positive Neer today. Rotator cuff strength +4/5 with abduction and external rotation, 5-/5 with internal rotation and lift off.  Neurovascularly intact distally  MSK U/S:  - Left shoulder: Bicep tendon with hypoechoic change & surrounding edema - do show early signs of healing at this time. Still with increased Doppler flow in a few neo-vessels present. Well-visualized both in short and long views.  Subscap with small area of hypoechoic change at tendinous insertion it was not visualized last exam, no increased Doppler flow,  this could represent small partial tear. Supraspinatus with small area of hypoechoic change new tendinous insertion not visualized last exam, could again represent small partial tear. Normal appearing infraspinatus. Images saved       Assessment & Plan:

## 2010-09-28 ENCOUNTER — Encounter: Payer: Self-pay | Admitting: Family Medicine

## 2010-09-28 ENCOUNTER — Ambulatory Visit (INDEPENDENT_AMBULATORY_CARE_PROVIDER_SITE_OTHER): Payer: Medicare HMO | Admitting: Family Medicine

## 2010-09-28 DIAGNOSIS — M79606 Pain in leg, unspecified: Secondary | ICD-10-CM

## 2010-09-28 DIAGNOSIS — I10 Essential (primary) hypertension: Secondary | ICD-10-CM

## 2010-09-28 DIAGNOSIS — E119 Type 2 diabetes mellitus without complications: Secondary | ICD-10-CM

## 2010-09-28 DIAGNOSIS — M79609 Pain in unspecified limb: Secondary | ICD-10-CM

## 2010-09-28 DIAGNOSIS — M549 Dorsalgia, unspecified: Secondary | ICD-10-CM

## 2010-09-28 LAB — BASIC METABOLIC PANEL
CO2: 24 mEq/L (ref 19–32)
Calcium: 9.9 mg/dL (ref 8.4–10.5)
Sodium: 139 mEq/L (ref 135–145)

## 2010-09-28 LAB — POCT GLYCOSYLATED HEMOGLOBIN (HGB A1C): Hemoglobin A1C: 6.4

## 2010-09-28 MED ORDER — RAMIPRIL 10 MG PO CAPS: ORAL_CAPSULE | ORAL | Status: DC

## 2010-09-28 MED ORDER — HYDROCHLOROTHIAZIDE 25 MG PO TABS: 25.0000 mg | ORAL_TABLET | Freq: Every day | ORAL | Status: DC

## 2010-09-28 MED ORDER — NAPROXEN 500 MG PO TABS: 500.0000 mg | ORAL_TABLET | Freq: Two times a day (BID) | ORAL | Status: DC

## 2010-09-28 NOTE — Patient Instructions (Addendum)
For your blood pressure- increase the Ramipril to 10mg  - goal is blood pressure less than 130/80 You can use up your tablets at home first Continue the HCTZ- I have sent a refill on this You can take the tramadol three times  A day as needed for pain I will check your kidneys, A1C- we will send a letter I will a new referral to Dr. Mikal Plane for your back If you have worse leg pain, swelling, please let us know, try to keep track when you have pain Next visit to follow-up  1 month

## 2010-09-28 NOTE — Progress Notes (Signed)
  Subjective:    Patient ID: [name], female    DOB: 1945-12-07, 65 y.o.   MRN: [id number]  HPI Here for med refills, back pain, leg pain   Back pain- history of DDD, herniated disc previously followed by Dr. Mikal Plane, she needs a new referral for her back pain, feels back is getting worse, mostly in lower back especially with exertion or bending, or her daily housework   Left leg- having pain in left leg for approx 1 year in the calf area, pain worse at night, unable to describe the pain, had a knee replacement in the past on left side , no skin changes, no leg swelling   Diabetes- True Result- Right source meter, test blood sugar typically 1-2x day, had 1 hypoglycemic episode of  1- when she felt shaky,  resoloved with eating , most CBG range 100-150, yesterday had CBG of 213- no other readings this high in her meter  Review of Systems     Objective:   Physical Exam   GEN- NAD. Alert and oriented   CVS- RRR, no murmur   RESP- CTAB   Back- neg SLR, TTP in lumbar region, able to stand- gait abnormal secondary to post polio    Ext- strength decreased in RLE (post polio), LLE 5/5, left leg, no swelling, non tender to palpation, no skin changes,neg homans   Feet- normal monofilament Left foot, diminihed in right foot       Assessment & Plan:

## 2010-09-30 ENCOUNTER — Other Ambulatory Visit: Payer: Self-pay | Admitting: Family Medicine

## 2010-09-30 DIAGNOSIS — M549 Dorsalgia, unspecified: Secondary | ICD-10-CM

## 2010-09-30 DIAGNOSIS — M79605 Pain in left leg: Secondary | ICD-10-CM | POA: Insufficient documentation

## 2010-09-30 NOTE — Assessment & Plan Note (Signed)
Chronic problem, pt needs new referral to Dr. Mikal Plane as she has switched PCP. Can increase Ultram to take dose during midday if needed. As she is established no work-up done

## 2010-09-30 NOTE — Assessment & Plan Note (Signed)
Unclear cause, possibly referred pain from back, not concerning for DVT, possible spasm Encouraged fluids during hot weather, check lytes

## 2010-09-30 NOTE — Assessment & Plan Note (Signed)
Goal < 130/80 Increase ACEI

## 2010-09-30 NOTE — Assessment & Plan Note (Signed)
A1C at goal, no change to meds

## 2010-10-05 ENCOUNTER — Encounter: Payer: Self-pay | Admitting: Home Health Services

## 2010-10-05 ENCOUNTER — Ambulatory Visit (INDEPENDENT_AMBULATORY_CARE_PROVIDER_SITE_OTHER): Payer: Medicare HMO | Admitting: Home Health Services

## 2010-10-05 VITALS — BP 121/81 | HR 61 | Temp 98.1°F | Ht 62.5 in | Wt 216.7 lb

## 2010-10-05 DIAGNOSIS — Z0000 Encounter for general adult medical examination without abnormal findings: Secondary | ICD-10-CM

## 2010-10-05 MED ORDER — PNEUMOCOCCAL VAC POLYVALENT 25 MCG/0.5ML IJ INJ: 0.5000 mL | INJECTION | Freq: Once | INTRAMUSCULAR | Status: DC

## 2010-10-05 NOTE — Patient Instructions (Signed)
1.Contact [employer] for [employer] sneakers program and try exercising 1 time a week to start. 2. Focus on eating 3-4 vegetables a day. 3. Complete Living Will and bring a copy to Dr. Alvester Morin. 4. If you feel sad or blue longer than 2 weeks, make an appointment with Dr. Alvester Morin.

## 2010-10-05 NOTE — Progress Notes (Signed)
Patient here for annual wellness visit, patient reports: Risk Factors/Conditions needing evaluation or treatment: Pt does not have any new risk factors that need evaluation. Home Safety: Pt lives by self in 1 story home.  Pt reports having smoke detectors and does not have adaptive equipment in bathroom. Other Information: Corrective lens: Pt uses daily corrective lens and visits eye doctor every 6 months for glaucoma and cataract monitoring. Dentures: Pt does not have dentures and visits dentist 1x per year. Memory: Pt denies memory problems. Patient's Mini Mental Score (recorded in doc. flowsheet): 30  Balance/Gait: Pt had polio and has a disability in right leg. Balance Abnormal Patient value  Sitting balance    Sit to stand    Attempts to arise    Immediate standing balance    Standing balance    Nudge    Eyes closed- Romberg    Tandem stance x Can not do  Back lean    Neck Rotation x dizzy  360 degree turn    Sitting down     Gait Abnormal Patient value  Initiation of gait    Step length-left    Step length-right x shortened  Step height-left    Step height-right    Step symmetry x uneven  Step continuity    Path deviation    Trunk movement x A little ridig  Walking stance x A little bent over      Annual Wellness Visit Requirements Recorded Today In  Medical, family, social history Past Medical, Family, Social History Section  Current providers Care team  Current medications Medications  Wt, BP, Ht, BMI Vital signs  Hearing assessment (welcome visit) Hearing/vision  Tobacco, alcohol, illicit drug use History  ADL Nurse Assessment  Depression Screening Nurse Assessment  Cognitive impairment Nurse Assessment  Mini Mental Status Document Flowsheet  Fall Risk Nurse Assessment  Home Safety Progress Note  End of Life Planning (welcome visit) Social Documentation  Medicare preventative services Progress Note  Risk factors/conditions needing evaluation/treatment  Progress Note  Personalized health advice Patient Instructions, goals, letter  Diet & Exercise Social Documentation  Emergency Contact Social Documentation  Seat Belts Social Documentation  Sun exposure/protection Social Documentation    Prevention Plan: Recommended pt schedule a bone density screening and contact pharmacy for shingles vaccine.   Recommended Medicare Prevention Screenings Women over 44 Test For Frequency Date of Last- BOLD if needed  Breast Cancer 1-2 yrs 2011  Cervical Cancer 1-3 yrs hysterectomy  Colorectal Cancer 1-10 yrs 2010  Osteoporosis once recommended  Cholesterol 5 yrs 12/10  Diabetes yearly 6/12  HIV yearly declined  Influenza Shot yearly 2011  Pneumonia Shot once 6/12  Zostavax Shot once recommended

## 2010-10-12 NOTE — Progress Notes (Signed)
I have reviewed this visit and discussed with Suzanne Lineberry and agree with her documentation  

## 2010-10-23 ENCOUNTER — Other Ambulatory Visit: Payer: Self-pay | Admitting: Family Medicine

## 2010-10-23 NOTE — Telephone Encounter (Signed)
Refill request

## 2010-10-23 NOTE — Telephone Encounter (Signed)
Pt will need to be seen before any NSAIDs are given as she has a history of hypertension, diabetes, and is on an ACEi.

## 2010-11-01 ENCOUNTER — Ambulatory Visit (INDEPENDENT_AMBULATORY_CARE_PROVIDER_SITE_OTHER): Payer: Medicare HMO | Admitting: Family Medicine

## 2010-11-01 ENCOUNTER — Encounter: Payer: Self-pay | Admitting: Family Medicine

## 2010-11-01 VITALS — BP 153/81 | HR 69 | Temp 98.1°F | Wt 217.0 lb

## 2010-11-01 DIAGNOSIS — M549 Dorsalgia, unspecified: Secondary | ICD-10-CM

## 2010-11-01 MED ORDER — GABAPENTIN 300 MG PO CAPS: 600.0000 mg | ORAL_CAPSULE | Freq: Three times a day (TID) | ORAL | Status: DC

## 2010-11-01 MED ORDER — HYDROCODONE-ACETAMINOPHEN 5-500 MG PO TABS: 1.0000 | ORAL_TABLET | Freq: Four times a day (QID) | ORAL | Status: DC | PRN

## 2010-11-01 MED ORDER — PRAVASTATIN SODIUM 40 MG PO TABS: 40.0000 mg | ORAL_TABLET | Freq: Every day | ORAL | Status: DC

## 2010-11-01 NOTE — Patient Instructions (Addendum)
It was good to see you today  I am starting you on a short term course of narcotics for you back pain until we can get you set up with the Neurosurgeons for your back pain  I am also increasing your neurontin  If you have any acute worsening in your back pain or any other concerning symptoms, please give [place] a call  Otherwise call with any questions,  God Bless Doree Albee MD

## 2010-11-01 NOTE — Assessment & Plan Note (Addendum)
This has been a chronic issue for this patient that is a the point of being possibly operable via neurosurgery. Will contact [employer] in the interim for needed prior authorization. Will give pt short course of narcotics until neurosurgery follow up can be established. Will also increase neurontin.  Would like to avoid NSAIDs given baseline hx/o HTN and elevated BP today which is likely secondary to pain.

## 2010-11-01 NOTE — Progress Notes (Signed)
  Subjective:    Patient ID: [name], female    DOB: Jul 28, 1945, 65 y.o.   MRN: [id number]  HPI Pt here to discuss back pain:   Pt reports longstanding history of back pain that pt has previously seen [facility] for this in the past. Pt was recently re-referred with planned spine surgery. However, pt states that since she has been switched to medicare from [employer] for insurance coverage. She now needs prior authorization before any visit can be made to the neurosurgeon. Recent imaging 01/2010(MRI) showed progressive worsening of lumbar disease including:  1. Some progression in degenerative disc disease at L4-5 where  there is now moderate to moderately severe central canal stenosis  and severe bilateral foraminal narrowing.  2. No change in marked bilateral foraminal narrowing, worse on the  left, at L5-S1 due to combination of facet degenerative disease and  bulging disc.  3. No marked change in a broad-based disc bulge at L1-2 causing  mild to moderate central canal stenosis.  4. Degenerative disc disease in the visualized lower thoracic  spine where prominent disc bulging is seen at T9-10 and T10-11.  Pt states that she has been placed on ultram for pain with minimal relief in pain. Pain seems to be most prominent with ambulation and bending. Intermittent referred pain to R leg. No bowel/bladder incontinence. No saddle anesthesia. No recent trauma or strenuous activity. Pain currently 10/10   Review of Systems See HPI     Objective:   Physical Exam Gen: up in chair, in mod distress secondary to pain Back: + TTP along R lumbar region. + Back pain with R hip internal and external rotation.      Assessment & Plan:

## 2010-11-09 ENCOUNTER — Ambulatory Visit (INDEPENDENT_AMBULATORY_CARE_PROVIDER_SITE_OTHER): Payer: Medicare HMO | Admitting: Family Medicine

## 2010-11-09 DIAGNOSIS — M549 Dorsalgia, unspecified: Secondary | ICD-10-CM

## 2010-11-09 DIAGNOSIS — M25519 Pain in unspecified shoulder: Secondary | ICD-10-CM

## 2010-11-09 DIAGNOSIS — S335XXA Sprain of ligaments of lumbar spine, initial encounter: Secondary | ICD-10-CM

## 2010-11-11 NOTE — Progress Notes (Signed)
  Subjective:    Patient ID: [name], female    DOB: Aug 07, 1945, 65 y.o.   MRN: [id number]  HPI  F/u shoulder pain---better. Is pretty happy with her improvement rating it at 90-95%. 2. 3-4 weeks of right lower back pain--worse with stabding. Does not radiate. No incontinence. No  speciufic injury. Achy pain--4-6/10. Worse with certain positions  Review of Systems    Pertinent review of systems: negative for fever or unusual weight change.  Objective:   Physical Exam   GEN WD NAD Shoulders B have FROm and intact strength in all palanes of rotator cuff. BACK TTP right paravertebral muscle in lumbar area. Stretching this area in forward flexion reproduces her pan. Neg SLR     Assessment & Plan:  1. Shoulder pain resolved 2. MSK back pain--will set up for PT. Can f/u with me or PCP

## 2010-11-26 ENCOUNTER — Other Ambulatory Visit: Payer: Self-pay | Admitting: Family Medicine

## 2010-11-26 NOTE — Telephone Encounter (Signed)
Refill request

## 2010-11-27 ENCOUNTER — Ambulatory Visit: Payer: Medicare HMO | Attending: Family Medicine

## 2010-11-27 DIAGNOSIS — M545 Low back pain, unspecified: Secondary | ICD-10-CM | POA: Insufficient documentation

## 2010-11-27 DIAGNOSIS — M2560 Stiffness of unspecified joint, not elsewhere classified: Secondary | ICD-10-CM | POA: Insufficient documentation

## 2010-11-27 DIAGNOSIS — IMO0001 Reserved for inherently not codable concepts without codable children: Secondary | ICD-10-CM | POA: Insufficient documentation

## 2010-11-27 DIAGNOSIS — R5381 Other malaise: Secondary | ICD-10-CM | POA: Insufficient documentation

## 2010-11-28 ENCOUNTER — Other Ambulatory Visit: Payer: Self-pay | Admitting: Family Medicine

## 2010-11-28 MED ORDER — OMEPRAZOLE 20 MG PO CPDR: 20.0000 mg | DELAYED_RELEASE_CAPSULE | Freq: Two times a day (BID) | ORAL | Status: DC

## 2010-12-05 ENCOUNTER — Ambulatory Visit: Payer: Medicare HMO

## 2010-12-07 ENCOUNTER — Ambulatory Visit: Payer: Medicare HMO

## 2010-12-11 ENCOUNTER — Ambulatory Visit: Payer: Medicare HMO | Attending: Family Medicine

## 2010-12-11 DIAGNOSIS — M545 Low back pain, unspecified: Secondary | ICD-10-CM | POA: Insufficient documentation

## 2010-12-11 DIAGNOSIS — R5381 Other malaise: Secondary | ICD-10-CM | POA: Insufficient documentation

## 2010-12-11 DIAGNOSIS — IMO0001 Reserved for inherently not codable concepts without codable children: Secondary | ICD-10-CM | POA: Insufficient documentation

## 2010-12-11 DIAGNOSIS — M2560 Stiffness of unspecified joint, not elsewhere classified: Secondary | ICD-10-CM | POA: Insufficient documentation

## 2010-12-13 ENCOUNTER — Ambulatory Visit: Payer: Medicare HMO

## 2010-12-21 ENCOUNTER — Ambulatory Visit: Payer: Medicare HMO | Admitting: Family Medicine

## 2011-01-01 ENCOUNTER — Other Ambulatory Visit: Payer: Self-pay | Admitting: Family Medicine

## 2011-01-01 NOTE — Telephone Encounter (Signed)
Refill request

## 2011-01-04 ENCOUNTER — Encounter: Payer: Self-pay | Admitting: Family Medicine

## 2011-01-04 ENCOUNTER — Ambulatory Visit (INDEPENDENT_AMBULATORY_CARE_PROVIDER_SITE_OTHER): Payer: Medicare HMO | Admitting: Family Medicine

## 2011-01-04 VITALS — BP 130/84 | HR 65

## 2011-01-04 DIAGNOSIS — M5136 Other intervertebral disc degeneration, lumbar region: Secondary | ICD-10-CM

## 2011-01-04 DIAGNOSIS — IMO0002 Reserved for concepts with insufficient information to code with codable children: Secondary | ICD-10-CM

## 2011-01-04 DIAGNOSIS — M5137 Other intervertebral disc degeneration, lumbosacral region: Secondary | ICD-10-CM

## 2011-01-04 DIAGNOSIS — M5416 Radiculopathy, lumbar region: Secondary | ICD-10-CM

## 2011-01-04 MED ORDER — METHYLPREDNISOLONE 4 MG PO KIT: PACK | ORAL | Status: AC

## 2011-01-04 NOTE — Progress Notes (Addendum)
Subjective:    Patient ID: [name], female    DOB: 03-17-1946, 65 y.o.   MRN: [id number]  HPI  Complaining of right lumbar radicular pain since last week. She has a long Hx of back pain for the last 10 year, she had ESI in 2006 x3 which helped. She also has done PT which she finished this month, 2 weeks ago. She has Hx of polio which affected her right leg which is her weak leg.  Dr. Gladstone Pih at [facility] neurosurgery specialist recomended her surgery in January but she has had other health issues that did not let her proceed with the surgery and he is not longer with his insurance.     Patient Active Problem List  Diagnoses  . HTN (hypertension)  . Diabetes mellitus  . Hyperlipidemia  . Shoulder pain, left  . Glaucoma  . Back pain  . Biceps tendon tear  . Shoulder pain  . Leg pain   Current Outpatient Prescriptions on File Prior to Visit  Medication Sig Dispense Refill  . Bimatoprost 0.01 % SOLN Apply to eye. 1 drop into both eyes daily       . brimonidine-timolol (COMBIGAN) 0.2-0.5 % ophthalmic solution Place 1 drop into both eyes every 12 (twelve) hours. 1 drop each eye every 12 hours       . gabapentin (NEURONTIN) 300 MG capsule TAKE 2 CAPSULES 3 TIMES A DAY  200 capsule  0  . hydrochlorothiazide 25 MG tablet Take 1 tablet (25 mg total) by mouth daily. Take 1/2 tablet daily for blood pressure  30 tablet  6  . metFORMIN (GLUCOPHAGE) 500 MG tablet Take 500 mg by mouth 2 (two) times daily with a meal.        . naproxen (NAPROSYN) 500 MG tablet Take 1 tablet (500 mg total) by mouth 2 (two) times daily with a meal.  60 tablet  2  . nitroGLYCERIN (NITRODUR - DOSED IN MG/24 HR) 0.1 mg/hr Apply 1/2 patch to left shoulder/bicep area daily - leave in place x 24 hours, change daily.  30 patch  2  . omeprazole (PRILOSEC) 20 MG capsule Take 1 capsule (20 mg total) by mouth 2 (two) times daily.  60 capsule  3  . pravastatin (PRAVACHOL) 40 MG tablet Take 1 tablet (40 mg total) by  mouth daily. 1 tab by  Mouth every  evening with meal  30 tablet  6  . ramipril (ALTACE) 10 MG capsule Take 1 tablet daily for blood pressure  30 capsule  6  . traMADol (ULTRAM) 50 MG tablet Take 1 tablet (50 mg total) by mouth 2 (two) times daily as needed for pain.  60 tablet  2   Current Facility-Administered Medications on File Prior to Visit  Medication Dose Route Frequency Provider Last Rate Last Dose  . pneumococcal 23 valent vaccine (PNU-IMMUNE) injection 0.5 mL  0.5 mL Intramuscular Once Marigene Ehlers, MPH       No Known Allergies    Review of Systems  Constitutional: Negative for fever, chills, diaphoresis and fatigue.  Musculoskeletal: Negative for joint swelling.  Neurological: Negative for tremors, weakness and numbness.       Objective:   Physical Exam  Constitutional: She appears well-developed and well-nourished.       BP 130/84  Pulse 65   Neck: Normal range of motion. Neck supple.  Pulmonary/Chest: Effort normal.  Musculoskeletal:       Low back with intact skin. No swelling, no hematomas. decrease for  flexion 90, extension 20 with mild pain, FROM for rotation and lateralization.  Faber test negative for SI joint pain. Straight leg raise negative B/L. Right leg with muscle atrophy from polio. Strength R leg 3/5 hip flexion and extension,3/5 for knee flexion and extension, 3/5 for ankle plantar and dorsal flexion  left leg 5/5 for hip flexion and extension, 5/5 for knee flexion and extension, 5/5 for ankle plantar and dorsal flexion . DTR patellar and achilles II/IV B/L Sensation intact distally B/L. Gait w/limp favoring right side   Neurological: She is alert.  Skin: Skin is warm. No rash noted. No erythema. No pallor.  Psychiatric: She has a normal mood and affect.          Assessment & Plan:        1. DDD (degenerative disc disease), lumbar  methylPREDNISolone (MEDROL, PAK,) 4 MG tablet  2. Lumbar radicular pain   methylPREDNISolone (MEDROL, PAK,) 4 MG tablet   Increase neurontin to 900mg  at HS Moist heat Cont back stretches when feeling better F/U in 2 weeks. We could repeat ESI. I discussed with her the need tp get a referral from her PCP to be seen on [facility] again.    I have reviewed the resident's note and agree with assessment and plan as stated. Denny Levy

## 2011-01-18 ENCOUNTER — Ambulatory Visit (INDEPENDENT_AMBULATORY_CARE_PROVIDER_SITE_OTHER): Payer: Medicare HMO | Admitting: Family Medicine

## 2011-01-18 ENCOUNTER — Encounter: Payer: Self-pay | Admitting: Family Medicine

## 2011-01-18 VITALS — BP 145/84 | HR 61

## 2011-01-18 DIAGNOSIS — M5137 Other intervertebral disc degeneration, lumbosacral region: Secondary | ICD-10-CM

## 2011-01-18 DIAGNOSIS — M5416 Radiculopathy, lumbar region: Secondary | ICD-10-CM

## 2011-01-18 DIAGNOSIS — M5136 Other intervertebral disc degeneration, lumbar region: Secondary | ICD-10-CM

## 2011-01-18 DIAGNOSIS — IMO0002 Reserved for concepts with insufficient information to code with codable children: Secondary | ICD-10-CM

## 2011-01-18 MED ORDER — TRAMADOL HCL 50 MG PO TABS: 50.0000 mg | ORAL_TABLET | Freq: Four times a day (QID) | ORAL | Status: DC | PRN

## 2011-01-18 MED ORDER — KETOROLAC TROMETHAMINE 60 MG/2ML IM SOLN: 60.0000 mg | Freq: Once | INTRAMUSCULAR | Status: AC

## 2011-01-18 NOTE — Progress Notes (Signed)
toradol 60mg  IM R gluteus medius.  novaplus [id number] Apr1,2014. Pt tolerated procedure well.

## 2011-01-18 NOTE — Progress Notes (Signed)
Subjective:    Patient ID: [name], female    DOB: July 19, 1945, 65 y.o.   MRN: [id number]  HPI  [name] is a pleasant  65 yo female patient Coming today to follow up on her right leg lumbar radiculopathy. We saw her 2 weeks ago and started her on medrol dose pack, we Increase her  HS neurontin to 900mg  .  recomended to continueMoist heat, Cont back stretches when feeling better . She states that she is on severe pain on her right leg, 10/10, having difficulty to walk due to the pain, the medrol did not helped her.Mild on and off  numbness and tingling.  She has a long Hx of back pain for the last 10 year, she had ESI in 2006 x3 which helped. She also has done PT which she finished this month, 2 weeks ago.  She has Hx of polio which affected her right leg which is her weak leg.  Dr. Gladstone Pih at [facility] neurosurgery specialist recomended her surgery in January but she has had other health issues that did not let her proceed with the surgery and he is not longer with his insurance.  Patient Active Problem List  Diagnoses  . HTN (hypertension)  . Diabetes mellitus  . Hyperlipidemia  . Shoulder pain, left  . Glaucoma  . Back pain  . Biceps tendon tear  . Shoulder pain  . Leg pain     Current Outpatient Prescriptions on File Prior to Visit  Medication Sig Dispense Refill  . Bimatoprost 0.01 % SOLN Apply to eye. 1 drop into both eyes daily       . brimonidine-timolol (COMBIGAN) 0.2-0.5 % ophthalmic solution Place 1 drop into both eyes every 12 (twelve) hours. 1 drop each eye every 12 hours       . gabapentin (NEURONTIN) 300 MG capsule TAKE 2 CAPSULES 3 TIMES A DAY  200 capsule  0  . hydrochlorothiazide 25 MG tablet Take 1 tablet (25 mg total) by mouth daily. Take 1/2 tablet daily for blood pressure  30 tablet  6  . metFORMIN (GLUCOPHAGE) 500 MG tablet Take 500 mg by mouth 2 (two) times daily with a meal.        . naproxen (NAPROSYN) 500 MG tablet Take 1 tablet (500 mg total) by  mouth 2 (two) times daily with a meal.  60 tablet  2  . nitroGLYCERIN (NITRODUR - DOSED IN MG/24 HR) 0.1 mg/hr Apply 1/2 patch to left shoulder/bicep area daily - leave in place x 24 hours, change daily.  30 patch  2  . omeprazole (PRILOSEC) 20 MG capsule Take 1 capsule (20 mg total) by mouth 2 (two) times daily.  60 capsule  3  . pravastatin (PRAVACHOL) 40 MG tablet Take 1 tablet (40 mg total) by mouth daily. 1 tab by  Mouth every  evening with meal  30 tablet  6  . ramipril (ALTACE) 10 MG capsule Take 1 tablet daily for blood pressure  30 capsule  6  . traMADol (ULTRAM) 50 MG tablet Take 1 tablet (50 mg total) by mouth 2 (two) times daily as needed for pain.  60 tablet  2   Current Facility-Administered Medications on File Prior to Visit  Medication Dose Route Frequency Provider Last Rate Last Dose  . pneumococcal 23 valent vaccine (PNU-IMMUNE) injection 0.5 mL  0.5 mL Intramuscular Once [name], MPH         Review of Systems  Constitutional: Negative for fever, chills, diaphoresis  and fatigue.  Musculoskeletal: Positive for gait problem. Negative for back pain and joint swelling.       Right leg pain  Neurological: Negative for tremors, weakness and numbness.       Objective:   Physical Exam  Constitutional: She appears well-developed and well-nourished.       BP 145/84  Pulse 61   Pulmonary/Chest: Effort normal.  Musculoskeletal:       Low back with intact skin. No swelling, no hematomas. decrease for flexion 90, extension 20 with mild pain, FROM for rotation and lateralization.  Faber test negative for SI joint pain. Straight leg raise negative B/L. Right leg with muscle atrophy from polio. Strength R leg 3/5 hip flexion and extension,3/5 for knee flexion and extension, 3/5 for ankle plantar and dorsal flexion  left leg 5/5 for hip flexion and extension, 5/5 for knee flexion and extension, 5/5 for ankle plantar and dorsal flexion . DTR patellar and achilles  II/IV B/L Sensation intact distally B/L. Gait w/limp favoring right side    Neurological: She is alert.  Skin: Skin is warm. No rash noted. No erythema. No pallor.  Psychiatric: She has a normal mood and affect.        Assessment & Plan:   1. Lumbar radiculopathy  ketorolac (TORADOL) injection 60 mg, traMADol (ULTRAM) 50 MG tablet, PR KETOROLAC TROMETHAMINE INJ, DISCONTINUED: traMADol (ULTRAM) 50 MG tablet  2. DDD (degenerative disc disease), lumbar  ketorolac (TORADOL) injection 60 mg, traMADol (ULTRAM) 50 MG tablet, PR KETOROLAC TROMETHAMINE INJ, DISCONTINUED: traMADol (ULTRAM) 50 MG tablet   Ref to be evaluated by Dr Maurice Small for [facility]. Dr Maurice Small came and saw her here in our office due to transportation problem on the patient side.She will be schedule for ESI injection soon. We will see her back after ESI

## 2011-01-25 ENCOUNTER — Telehealth: Payer: Self-pay | Admitting: Family Medicine

## 2011-01-25 NOTE — Telephone Encounter (Signed)
Fwd. To PCP for refill. .[name]  

## 2011-01-25 NOTE — Telephone Encounter (Signed)
[facility] [address] has been faxing a refill request for Naprosyn.  Ms. Rogacki is now completely out.

## 2011-01-29 NOTE — Telephone Encounter (Signed)
Forward to Dr Alvester Morin

## 2011-01-29 NOTE — Telephone Encounter (Signed)
[name] is calling back but has not heard anything about getting her Naprosyn.  She is now completely out.  She initially called on Friday.

## 2011-01-30 ENCOUNTER — Other Ambulatory Visit: Payer: Self-pay | Admitting: Family Medicine

## 2011-01-30 MED ORDER — NAPROXEN 500 MG PO TABS: 500.0000 mg | ORAL_TABLET | Freq: Two times a day (BID) | ORAL | Status: DC

## 2011-01-30 NOTE — Telephone Encounter (Signed)
[name] is calling back to check on the status of the request for refill on her Naprosyn and she now says she needs a refill on her Metformin.  She did see that she got the Neurontin.  She is completely out of both of these medications and has called since she started calling about this on 10/19.

## 2011-01-30 NOTE — Telephone Encounter (Signed)
Refill request

## 2011-01-30 NOTE — Telephone Encounter (Signed)
Rx completed

## 2011-02-04 ENCOUNTER — Telehealth: Payer: Self-pay | Admitting: Family Medicine

## 2011-02-04 MED ORDER — METFORMIN HCL 500 MG PO TABS: 500.0000 mg | ORAL_TABLET | Freq: Two times a day (BID) | ORAL | Status: DC

## 2011-02-04 NOTE — Telephone Encounter (Signed)
Pharm states they sent request for her Metformin last week and they are telling her that they have not heard from Dr Alvester Morin yet.  She is now out. [facility] [address]

## 2011-02-04 NOTE — Telephone Encounter (Signed)
[facility] and [facility] did [facility], called and notified patient of this.Kendalynn Wideman, Rodena Medin

## 2011-02-04 NOTE — Telephone Encounter (Signed)
[facility] not [facility] the refill request back on the Metformin.  She has enough to last until tomorrow, but then she will be out.

## 2011-02-08 ENCOUNTER — Ambulatory Visit (INDEPENDENT_AMBULATORY_CARE_PROVIDER_SITE_OTHER): Payer: Medicare HMO | Admitting: Family Medicine

## 2011-02-08 VITALS — BP 120/79

## 2011-02-08 DIAGNOSIS — M549 Dorsalgia, unspecified: Secondary | ICD-10-CM

## 2011-02-08 MED ORDER — CARISOPRODOL 350 MG PO TABS: ORAL_TABLET | ORAL | Status: DC

## 2011-02-08 NOTE — Patient Instructions (Signed)
Please check with your eye doctor regarding the soma. I did not find anything in our database to indicate it would be harmful for those with glaucoma. I would like you to start at one half tab at night and see if that helps. He careful as it may cause you to be dizzy the next day. It is causing her side effects and helping some he can increase to one tab at night.  He can followup with [place] when necessary. Either your PCP or we can help to get another steroid injection in your back when needed.

## 2011-02-08 NOTE — Assessment & Plan Note (Signed)
40% improvement from the epidural steroid injection. We talked about this at some length. She couldn't try to get back in to see the neurosurgeon and proceed forward with her surgery. In the arm we would be happy to help her setup another epidural steroid injection or her PCP can do this. #2. For her increased discomfort in the right leg at night which sounds a little bit like restless leg syndrome each eye some Soma Compound. Low dose if she is older. We discussed precautions and she will check with her ophthalmologist specifically as she has glaucoma.

## 2011-02-08 NOTE — Progress Notes (Signed)
  Subjective:    Patient ID: [name], female    DOB: 08-02-1945, 65 y.o.   MRN: [id number]  HPI  Followup of bilateral but significantly greater right than left leg pain. At last office visit we had increased her gabapentin at night which helps her sleep more. She doesn't think she could increase her daily dose because it does make her sleepy. Her pain is about 40% better since the epidural injection. She is having some increased discomfort particularly in the right leg at night when she first lays down to go to sleep. It feels tight and she has to get up and walk around and rub it. She reports that in the past she had used some muscle relaxers for similar symptoms was taken off of them because of her glaucoma.  Review her MRI findings from October. She has previously been seen at [facility] neurosurgery. PERTINENT  PMH / PSH: History of glaucoma. She's not sure what her last intraocular pressure was. She sees her eye doctor later this month. History of polio Review of Systems    denies fever, chills, unusual weight gain or loss. Objective:   Physical Exam   Vital signs reviewed. GENERAL: Well developed, well nourished, no acute distress Walking with a cane the right-sided limp. Right lower extremity strength at hip flexors and extensors 3/5 as his dorsalis and plantar flexion. 5 out of 5 comparison on the left.     Assessment & Plan:  40% improvement from the epidural steroid injection. We talked about this at some length. She couldn't try to get back in to see the neurosurgeon and proceed forward with her surgery. In the arm we would be happy to help her setup another epidural steroid injection or her PCP can do this. #2. For her increased discomfort in the right leg at night which sounds a little bit like restless leg syndrome each eye some Soma Compound. Low dose if she is older. We discussed precautions and she will check with her ophthalmologist specifically as she has  glaucoma.

## 2011-02-27 ENCOUNTER — Encounter: Payer: Self-pay | Admitting: Family Medicine

## 2011-02-27 ENCOUNTER — Ambulatory Visit (INDEPENDENT_AMBULATORY_CARE_PROVIDER_SITE_OTHER): Payer: Medicare HMO | Admitting: Family Medicine

## 2011-02-27 VITALS — BP 148/85 | HR 86 | Temp 98.7°F | Ht 62.5 in | Wt 214.0 lb

## 2011-02-27 DIAGNOSIS — E119 Type 2 diabetes mellitus without complications: Secondary | ICD-10-CM

## 2011-02-27 DIAGNOSIS — M549 Dorsalgia, unspecified: Secondary | ICD-10-CM

## 2011-02-27 DIAGNOSIS — IMO0002 Reserved for concepts with insufficient information to code with codable children: Secondary | ICD-10-CM

## 2011-02-27 DIAGNOSIS — E785 Hyperlipidemia, unspecified: Secondary | ICD-10-CM

## 2011-02-27 DIAGNOSIS — Z23 Encounter for immunization: Secondary | ICD-10-CM

## 2011-02-27 DIAGNOSIS — I10 Essential (primary) hypertension: Secondary | ICD-10-CM

## 2011-02-27 DIAGNOSIS — M5410 Radiculopathy, site unspecified: Secondary | ICD-10-CM

## 2011-02-27 LAB — LIPID PANEL
HDL: 55 mg/dL (ref >39)
LDL Cholesterol: 71 mg/dL (ref 0–99)
Total CHOL/HDL Ratio: 2.6 Ratio
Triglycerides: 85 mg/dL (ref <150)
VLDL: 17 mg/dL (ref 0–40)

## 2011-02-27 LAB — COMPREHENSIVE METABOLIC PANEL
Alkaline Phosphatase: 72 U/L (ref 39–117)
BUN: 12 mg/dL (ref 6–23)
Glucose, Bld: 103 mg/dL — ABNORMAL HIGH (ref 70–99)
Total Bilirubin: 0.5 mg/dL (ref 0.3–1.2)

## 2011-02-27 LAB — CBC
HCT: 41.6 % (ref 36.0–46.0)
Hemoglobin: 13 g/dL (ref 12.0–15.0)
MCH: 25.5 pg — ABNORMAL LOW (ref 26.0–34.0)
MCHC: 31.3 g/dL (ref 30.0–36.0)

## 2011-02-27 MED ORDER — GABAPENTIN 400 MG PO CAPS: ORAL_CAPSULE | ORAL | Status: DC

## 2011-02-27 MED ORDER — HYDROCODONE-ACETAMINOPHEN 5-500 MG PO TABS: 1.0000 | ORAL_TABLET | Freq: Four times a day (QID) | ORAL | Status: DC | PRN

## 2011-02-27 NOTE — Patient Instructions (Signed)
It was good to see you today  I have increased your neurontin for your leg. Use this as directed.  I am referring you to the neurosurgeon. I am checking some blood work today.  Come back to see me after the new year.  Call with any questions,  God Bless,  Doree Albee MD

## 2011-02-27 NOTE — Progress Notes (Signed)
  Subjective:    Patient ID: [name], female    DOB: June 30, 1945, 65 y.o.   MRN: [id number]  HPI Pt is here for chronic problem f/u:  DM: Checking CBGs?:yes; once to twice daily How Often?:once to twice daily Blood Sugar Range:110s per pt  Polyuria, polydypsia, polyphagia:no Symptomatic Hypoglycemia:no Medication Compliance?:yes ACE if hypertensive/obesity?:yes Statin if LDL >100?:yes; pravachol 40  HTN:  Checking BPS Daily ?:no BP Range:N/A Any HA, CP, SOB?:no Any Medication Side Effects:no Medication Compliance:yes Salt intake?:pt states that she eats a low salt diet and seasons her foods with garlic powder  Exercise?:cannot 2/2 back and joint pain. Does some walking as tolerated  Weight Loss?:relatively stable.   Radicular back pain:  Patient has long-standing history of recurrent radicular low back pain. Patient recently had some epidural steroid injections of the back as well as physical therapy. Patient states that ESI initially helped with back pain for 4-5 weeks. However, pain has returned. Patient states that pain is still been persistent. No bowel or bladder anesthesia. Weakness and pain has progressed. A referral to neurosurgery has been discussed in the past. Patient states she is now ready for this evaluation as pain, function,  and weakness has progressed despite aggressive nonsurgical management.  L shoulder pain is minimal today  Review of Systems See HPI , otherwise 12 point ROS negative.     Objective:   Physical Exam Gen: up in chair, NAD HEENT: NCAT, EOMI, TMs clear bilaterally CV: RRR, no murmurs auscultated PULM: CTAB, no wheezes, rales, rhoncii ABD: S/NT/+ bowel sounds  EXT: 2+ peripheral pulses Back: + TTP along R lumbar region. + Back pain with R hip internal and external rotation. 1-2/5 strength on R with knee extension vs 3-4/5 strength with knee extension on L.  Neuro: CN II-XII grossly intact; + decreased patellar reflexes R>L     Assessment & Plan:

## 2011-03-02 ENCOUNTER — Encounter: Payer: Self-pay | Admitting: Family Medicine

## 2011-03-02 NOTE — Assessment & Plan Note (Addendum)
Will formally refer pt to Neurosurgery. Neurontin also increased. Vicodin refilled. Discussed red flags for reevaluation.

## 2011-03-02 NOTE — Assessment & Plan Note (Signed)
A1C at goal today. Will continue with current regimen. Will f/u in 3 months.

## 2011-03-02 NOTE — Assessment & Plan Note (Signed)
Mildly elevated today in setting of LBP. Will continue on current regimen. Will check Cr and K today.

## 2011-03-06 ENCOUNTER — Encounter: Payer: Self-pay | Admitting: Family Medicine

## 2011-03-07 ENCOUNTER — Telehealth: Payer: Self-pay | Admitting: *Deleted

## 2011-03-07 NOTE — Telephone Encounter (Signed)
Called [name] and informed her of upcoming appointment with Dr Gerlene Fee, [facility] on 03/18/11 at 10:45 am.Busick, Rodena Medin

## 2011-03-15 ENCOUNTER — Encounter: Payer: Self-pay | Admitting: Family Medicine

## 2011-04-22 ENCOUNTER — Other Ambulatory Visit: Payer: Self-pay | Admitting: Family Medicine

## 2011-04-22 NOTE — Telephone Encounter (Signed)
Refill request

## 2011-04-23 NOTE — Telephone Encounter (Signed)
Refill request

## 2011-05-17 ENCOUNTER — Other Ambulatory Visit: Payer: Self-pay | Admitting: Neurosurgery

## 2011-05-17 ENCOUNTER — Other Ambulatory Visit: Payer: Self-pay | Admitting: Family Medicine

## 2011-05-17 DIAGNOSIS — M545 Low back pain, unspecified: Secondary | ICD-10-CM

## 2011-05-17 NOTE — Telephone Encounter (Signed)
Refill request

## 2011-05-22 ENCOUNTER — Other Ambulatory Visit: Payer: Self-pay | Admitting: Family Medicine

## 2011-05-22 DIAGNOSIS — M549 Dorsalgia, unspecified: Secondary | ICD-10-CM

## 2011-05-22 MED ORDER — ALPRAZOLAM 0.5 MG PO TABS: 0.5000 mg | ORAL_TABLET | Freq: Every evening | ORAL | Status: AC | PRN

## 2011-05-22 MED ORDER — HYDROCODONE-ACETAMINOPHEN 5-500 MG PO TABS: 1.0000 | ORAL_TABLET | Freq: Four times a day (QID) | ORAL | Status: DC | PRN

## 2011-05-24 ENCOUNTER — Ambulatory Visit
Admission: RE | Admit: 2011-05-24 | Discharge: 2011-05-24 | Disposition: A | Discharge status: Home / Self Care | Payer: Medicare Other | Source: Ambulatory Visit | Attending: Neurosurgery | Admitting: Neurosurgery

## 2011-05-24 DIAGNOSIS — M545 Low back pain, unspecified: Secondary | ICD-10-CM

## 2011-06-07 ENCOUNTER — Encounter: Payer: Self-pay | Admitting: Family Medicine

## 2011-06-07 ENCOUNTER — Ambulatory Visit (INDEPENDENT_AMBULATORY_CARE_PROVIDER_SITE_OTHER): Payer: PRIVATE HEALTH INSURANCE | Admitting: Family Medicine

## 2011-06-07 DIAGNOSIS — E119 Type 2 diabetes mellitus without complications: Secondary | ICD-10-CM

## 2011-06-07 DIAGNOSIS — J069 Acute upper respiratory infection, unspecified: Secondary | ICD-10-CM

## 2011-06-07 DIAGNOSIS — M549 Dorsalgia, unspecified: Secondary | ICD-10-CM

## 2011-06-07 DIAGNOSIS — I10 Essential (primary) hypertension: Secondary | ICD-10-CM

## 2011-06-07 LAB — BASIC METABOLIC PANEL
CO2: 28 mEq/L (ref 19–32)
Calcium: 9.6 mg/dL (ref 8.4–10.5)
Potassium: 4.3 mEq/L (ref 3.5–5.3)
Sodium: 139 mEq/L (ref 135–145)

## 2011-06-07 LAB — POCT GLYCOSYLATED HEMOGLOBIN (HGB A1C): Hemoglobin A1C: 6.4

## 2011-06-07 MED ORDER — METFORMIN HCL 500 MG PO TABS: 500.0000 mg | ORAL_TABLET | Freq: Two times a day (BID) | ORAL | Status: DC

## 2011-06-07 MED ORDER — OMEPRAZOLE 20 MG PO CPDR: 20.0000 mg | DELAYED_RELEASE_CAPSULE | Freq: Two times a day (BID) | ORAL | Status: DC

## 2011-06-07 MED ORDER — HYDROCODONE-ACETAMINOPHEN 5-500 MG PO TABS: 1.0000 | ORAL_TABLET | Freq: Four times a day (QID) | ORAL | Status: DC | PRN

## 2011-06-07 NOTE — Patient Instructions (Addendum)
It was good to see you today  You likely have a viral upper respiratory infection If your symptoms become worse, let us know. I am checking some blood work today.  Come back to see me in 3 months,  Call with any questions. God Bless,  Doree Albee MD   Viremia Your exam shows you have a viral illness. Viremia means your symptoms are due to the presence of the virus in your blood. This will often cause a chill or sweat. Other common symptoms of viral infections include fever, muscle aches, headache, fatigue, stomach upsets, sore throat, and dry cough. Antibiotics are not effective in viral illnesses; they are usually only given when there is a secondary bacterial infection. General treatment includes bed rest, increasing oral fluid intake of clear, non-caffeinated drinks like ginger ale, fruit juices, water, or sports drinks. Medicines to relieve specific symptoms such as cough, pain, or diarrhea may also be prescribed. Only take over-the-counter or prescription medicines for pain, discomfort, or fever as directed by your caregiver.  Please call your doctor if you are not better after 2 to 3 days of symptom treatment. Call or return here right away if your illness gets more severe, or you develop any other new symptoms, such as a fever above 103 F (39.4 C), vomiting for more than a day, severe headache or other pain, stiff neck, trouble breathing, visual problems, "blackouts" or fainting. Document Released: 05/02/2004 Document Revised: 12/05/2010 Document Reviewed: 03/25/2005 [facility] Patient Information 2012 [place], [place].

## 2011-06-07 NOTE — Assessment & Plan Note (Signed)
Checking A1C. Good control per pt report. Will follow pending A1C.

## 2011-06-07 NOTE — Progress Notes (Signed)
S:  Pt presents today with URI symptoms. Symptoms have been present for 3 days. Symptoms include rhinorrhea, nasal congestion, post nasal drip, cough. no fevers. PO intake and UOP have been at baseline. no rash. No known Sick contacts. No cardiorespiratory risk factors. Pt has had flu shot. Pt is nonsmoker   DM: Checking CBGs?:yes How Often?:once to twice daily  Blood Sugar Range:110s-120s Polyuria, polydypsia, polyphagia:no Symptomatic Hypoglycemia:once, earlier today,otherwise no  Medication Compliance?:yes ACE if hypertensive/obesity?:yes Statin if LDL >100?:yes  HTN:  Checking BPS Daily ?:no BP Range:n/a Any HA, CP, SOB?:no Any Medication Side Effects:no Medication Compliance:yes Salt intake?:low Exercise?:no  O:  Current Outpatient Prescriptions  Medication Sig Dispense Refill  . ALPRAZolam (XANAX) 0.5 MG tablet Take 1 tablet (0.5 mg total) by mouth at bedtime as needed for sleep.  10 tablet  0  . Bimatoprost 0.01 % SOLN Apply to eye. 1 drop into both eyes daily       . brimonidine-timolol (COMBIGAN) 0.2-0.5 % ophthalmic solution Place 1 drop into both eyes every 12 (twelve) hours. 1 drop each eye every 12 hours       . carisoprodol (SOMA) 350 MG tablet Take one tablet by mouth at night for leg pain  30 tablet  0  . gabapentin (NEURONTIN) 400 MG capsule TAKE 2 CAPSULES EVERY MORNING AND AFTERNOON AND 3 AT NIGHT  220 capsule  2  . hydrochlorothiazide (HYDRODIURIL) 25 MG tablet TAKE 1 TABLET BY MOUTH EVERY DAY  30 tablet  6  . HYDROcodone-acetaminophen (VICODIN) 5-500 MG per tablet Take 1 tablet by mouth every 6 (six) hours as needed for pain.  60 tablet  0  . metFORMIN (GLUCOPHAGE) 500 MG tablet Take 1 tablet (500 mg total) by mouth 2 (two) times daily with a meal.  60 tablet  3  . naproxen (NAPROSYN) 500 MG tablet Take 1 tablet (500 mg total) by mouth 2 (two) times daily with a meal.  60 tablet  2  . nitroGLYCERIN (NITRODUR - DOSED IN MG/24 HR) 0.1 mg/hr Apply 1/2 patch to left  shoulder/bicep area daily - leave in place x 24 hours, change daily.  30 patch  2  . omeprazole (PRILOSEC) 20 MG capsule Take 1 capsule (20 mg total) by mouth 2 (two) times daily.  60 capsule  3  . pravastatin (PRAVACHOL) 40 MG tablet TAKE 1 TABLET EVERY DAY WITH EVENING MEAL  30 tablet  6  . ramipril (ALTACE) 10 MG capsule TAKE 1 CAPSULE EVERY DAY FOR BLOOD PRESSURE  30 capsule  6  . traMADol (ULTRAM) 50 MG tablet Take 1 tablet (50 mg total) by mouth 2 (two) times daily as needed for pain.  60 tablet  2  . traMADol (ULTRAM) 50 MG tablet Take 1 tablet (50 mg total) by mouth every 6 (six) hours as needed for pain (maximun 6 tab in 24 hours).  60 tablet  2   Current Facility-Administered Medications  Medication Dose Route Frequency Provider Last Rate Last Dose  . pneumococcal 23 valent vaccine (PNU-IMMUNE) injection 0.5 mL  0.5 mL Intramuscular Once Marigene Ehlers, MPH        Wt Readings from Last 3 Encounters:  06/07/11 222 lb 4.8 oz (100.835 kg)  02/27/11 214 lb (97.07 kg)  11/01/10 217 lb (98.431 kg)   Temp Readings from Last 3 Encounters:  06/07/11 98.7 F (37.1 C) Oral  02/27/11 98.7 F (37.1 C) Oral  11/01/10 98.1 F (36.7 C) Oral   BP Readings from Last 3  Encounters:  06/07/11 114/80  02/27/11 148/85  02/08/11 120/79   Pulse Readings from Last 3 Encounters:  06/07/11 74  02/27/11 86  01/18/11 61   General: alert and cooperative HEENT: PERRLA and extra ocular movement intact, nasal mucosa WNL, + post oropharyngeal erythema  Heart: S1, S2 normal, no murmur, rub or gallop, regular rate and rhythm Lungs: clear to auscultation, no wheezes or rales and unlabored breathing Abdomen: abdomen is soft without significant tenderness, masses, organomegaly or guarding Extremities: extremities normal, atraumatic, no cyanosis or edema    A/P:

## 2011-06-07 NOTE — Assessment & Plan Note (Signed)
Otherwise well controlled, will continue on current regimen.

## 2011-06-07 NOTE — Assessment & Plan Note (Signed)
Viral in etiology. Discussed supportive care and infectious red flags. Will follow prn.

## 2011-08-12 ENCOUNTER — Other Ambulatory Visit: Payer: Self-pay | Admitting: Family Medicine

## 2011-08-22 ENCOUNTER — Ambulatory Visit (INDEPENDENT_AMBULATORY_CARE_PROVIDER_SITE_OTHER): Payer: PRIVATE HEALTH INSURANCE | Admitting: Family Medicine

## 2011-08-22 ENCOUNTER — Encounter: Payer: Self-pay | Admitting: Family Medicine

## 2011-08-22 VITALS — BP 117/77 | HR 71 | Temp 98.3°F | Ht 62.5 in | Wt 217.0 lb

## 2011-08-22 DIAGNOSIS — E119 Type 2 diabetes mellitus without complications: Secondary | ICD-10-CM

## 2011-08-22 DIAGNOSIS — I10 Essential (primary) hypertension: Secondary | ICD-10-CM

## 2011-08-22 NOTE — Assessment & Plan Note (Signed)
Well-controlled.  Continue current regimen. 

## 2011-08-22 NOTE — Progress Notes (Signed)
Subjective:   Hypertension:  Checking BPS Daily ?:intermittently BP Range:120s Any HA, CP, SOB?:no Any Medication Side Effects:no Medication Compliance:yes Salt intake?:low Exercise?:no Weight Loss?:no  Lab Results  Component Value Date   LDLCALC 71 02/27/2011     Pt also has multiple forms for transportation ([employer]) here to fill out.   Review of Systems - Negative except as noted above.  Objective:  Current Outpatient Prescriptions  Medication Sig Dispense Refill  . Bimatoprost 0.01 % SOLN Apply to eye. 1 drop into both eyes daily       . brimonidine-timolol (COMBIGAN) 0.2-0.5 % ophthalmic solution Place 1 drop into both eyes every 12 (twelve) hours. 1 drop each eye every 12 hours       . carisoprodol (SOMA) 350 MG tablet Take one tablet by mouth at night for leg pain  30 tablet  0  . gabapentin (NEURONTIN) 400 MG capsule TAKE 2 CAPSULES EVERY MORNING AND AFTERNOON AND 3 AT NIGHT  220 capsule  2  . hydrochlorothiazide (HYDRODIURIL) 25 MG tablet TAKE 1 TABLET BY MOUTH EVERY DAY  30 tablet  6  . HYDROcodone-acetaminophen (VICODIN) 5-500 MG per tablet Take 1 tablet by mouth every 6 (six) hours as needed for pain.  60 tablet  0  . metFORMIN (GLUCOPHAGE) 500 MG tablet Take 1 tablet (500 mg total) by mouth 2 (two) times daily with a meal.  60 tablet  3  . naproxen (NAPROSYN) 500 MG tablet Take 1 tablet (500 mg total) by mouth 2 (two) times daily with a meal.  60 tablet  2  . nitroGLYCERIN (NITRODUR - DOSED IN MG/24 HR) 0.1 mg/hr Apply 1/2 patch to left shoulder/bicep area daily - leave in place x 24 hours, change daily.  30 patch  2  . omeprazole (PRILOSEC) 20 MG capsule Take 1 capsule (20 mg total) by mouth 2 (two) times daily.  60 capsule  3  . pravastatin (PRAVACHOL) 40 MG tablet TAKE 1 TABLET EVERY DAY WITH EVENING MEAL  30 tablet  6  . ramipril (ALTACE) 10 MG capsule TAKE 1 CAPSULE EVERY DAY FOR BLOOD PRESSURE  30 capsule  6  . traMADol (ULTRAM) 50 MG tablet Take 1 tablet (50 mg  total) by mouth every 6 (six) hours as needed for pain (maximun 6 tab in 24 hours).  60 tablet  2   Current Facility-Administered Medications  Medication Dose Route Frequency Provider Last Rate Last Dose  . pneumococcal 23 valent vaccine (PNU-IMMUNE) injection 0.5 mL  0.5 mL Intramuscular Once Oneal Grout, MPH        Wt Readings from Last 3 Encounters:  08/22/11 217 lb (98.431 kg)  06/07/11 222 lb 4.8 oz (100.835 kg)  02/27/11 214 lb (97.07 kg)   Temp Readings from Last 3 Encounters:  08/22/11 98.3 F (36.8 C) Oral  06/07/11 98.7 F (37.1 C) Oral  02/27/11 98.7 F (37.1 C) Oral   BP Readings from Last 3 Encounters:  08/22/11 117/77  06/07/11 114/80  02/27/11 148/85   Pulse Readings from Last 3 Encounters:  08/22/11 71  06/07/11 74  02/27/11 86    General: alert and cooperative HEENT: PERRLA and extra ocular movement intact Heart: S1, S2 normal, no murmur, rub or gallop, regular rate and rhythm Lungs: clear to auscultation, no wheezes or rales and unlabored breathing Abdomen: abdomen is soft without significant tenderness, masses, organomegaly or guarding Extremities: extremities normal, atraumatic, no cyanosis or edema Skin:no rashes Neurology: normal without focal findings   Assessment/Plan:  [employer] form filled out.

## 2011-08-22 NOTE — Patient Instructions (Signed)

## 2011-08-23 LAB — MICROALBUMIN / CREATININE URINE RATIO
Microalb Creat Ratio: 7.8 mg/g (ref 0.0–30.0)
Microalb, Ur: 0.5 mg/dL (ref 0.00–1.89)

## 2011-09-08 ENCOUNTER — Encounter (HOSPITAL_COMMUNITY): Payer: Self-pay

## 2011-09-08 ENCOUNTER — Emergency Department (HOSPITAL_COMMUNITY)
Admission: EM | Admit: 2011-09-08 | Discharge: 2011-09-08 | Disposition: A | Discharge status: Home / Self Care | Payer: PRIVATE HEALTH INSURANCE | Attending: Emergency Medicine | Admitting: Emergency Medicine

## 2011-09-08 DIAGNOSIS — M47812 Spondylosis without myelopathy or radiculopathy, cervical region: Secondary | ICD-10-CM | POA: Insufficient documentation

## 2011-09-08 DIAGNOSIS — E119 Type 2 diabetes mellitus without complications: Secondary | ICD-10-CM | POA: Insufficient documentation

## 2011-09-08 DIAGNOSIS — M1990 Unspecified osteoarthritis, unspecified site: Secondary | ICD-10-CM | POA: Insufficient documentation

## 2011-09-08 DIAGNOSIS — IMO0001 Reserved for inherently not codable concepts without codable children: Secondary | ICD-10-CM | POA: Insufficient documentation

## 2011-09-08 DIAGNOSIS — M79603 Pain in arm, unspecified: Secondary | ICD-10-CM

## 2011-09-08 DIAGNOSIS — Z7982 Long term (current) use of aspirin: Secondary | ICD-10-CM | POA: Insufficient documentation

## 2011-09-08 DIAGNOSIS — G5600 Carpal tunnel syndrome, unspecified upper limb: Secondary | ICD-10-CM | POA: Insufficient documentation

## 2011-09-08 DIAGNOSIS — Z79899 Other long term (current) drug therapy: Secondary | ICD-10-CM | POA: Insufficient documentation

## 2011-09-08 DIAGNOSIS — I10 Essential (primary) hypertension: Secondary | ICD-10-CM | POA: Insufficient documentation

## 2011-09-08 DIAGNOSIS — K219 Gastro-esophageal reflux disease without esophagitis: Secondary | ICD-10-CM | POA: Insufficient documentation

## 2011-09-08 DIAGNOSIS — E785 Hyperlipidemia, unspecified: Secondary | ICD-10-CM | POA: Insufficient documentation

## 2011-09-08 MED ORDER — HYDROMORPHONE HCL PF 1 MG/ML IJ SOLN
1.0000 mg | Freq: Once | INTRAMUSCULAR | Status: AC
  Administered 2011-09-08: 1 mg via INTRAMUSCULAR
  Filled 2011-09-08: qty 1

## 2011-09-08 MED ORDER — KETOROLAC TROMETHAMINE 60 MG/2ML IM SOLN
60.0000 mg | Freq: Once | INTRAMUSCULAR | Status: AC
  Administered 2011-09-08: 60 mg via INTRAMUSCULAR
  Filled 2011-09-08: qty 2

## 2011-09-08 NOTE — ED Notes (Signed)
Left shoulder and arm apin, for 1.5 weeks, denies hx of same or injury.

## 2011-09-08 NOTE — ED Provider Notes (Addendum)
History     CSN: [id number]  Arrival date & time 09/08/11  1307   First MD Initiated Contact with Patient 09/08/11 1413      Chief Complaint  Patient presents with  . Arm Pain    (Consider location/radiation/quality/duration/timing/severity/associated sxs/prior treatment) Patient is a 66 y.o. female presenting with arm pain. The history is provided by the patient.  Arm Pain This is a new problem. The current episode started 1 to 4 weeks ago. The problem occurs constantly. The problem has been unchanged. Pertinent negatives include no chest pain, fever, numbness, rash or weakness. Associated symptoms comments: She complains of left arm pain originating in the posterior shoulder and radiating to wrist. The pain is sharp, worse with movement. No known injury. She denies neck pain, chest pain, SOB, pleuritic pain, fever. She has not seen any swelling. The pain is limited to the posterior shoulder and the back of the arm..    Past Medical History  Diagnosis Date  . Diabetes mellitus   . GERD (gastroesophageal reflux disease)   . Hyperlipidemia   . Hypertension   . 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 Arthritis     osteoarthritis  . Back pain, chronic     per pt disc disease, disc herniation- Followed by Dr. Mikal Plane  . Carpal tunnel syndrome on both sides     Followed by Dr. Mina Marble  . Glaucoma   . Trigger finger   . Polio osteopathy of lower leg 1948  . DDD (degenerative disc disease)     Lumbar Spondylosis  . Abnormal Pap smear of cervix 1970    Cryosurgery done  . Vitamin d deficiency     replaced 2010    Past Surgical History  Procedure Date  . Cholecystectomy   . Hand surgery     Right wrist, s/p plate removal in Feb 2012  . Abdominal hysterectomy     partial  . Joint replacement 2006    left knee    Family History  Problem Relation Age of Onset  . Heart disease Father   . Heart disease Sister   . Hyperlipidemia Sister   .  Hypertension Sister   . Kidney disease Brother     History  Substance Use Topics  . Smoking status: Never Smoker   . Smokeless tobacco: Never Used  . Alcohol Use: No    OB History    Grav Para Term Preterm Abortions TAB SAB Ect Mult Living                  Review of Systems  Constitutional: Negative for fever.  Respiratory: Negative for shortness of breath.   Cardiovascular: Negative for chest pain.  Musculoskeletal:       See HPI.  Skin: Negative for rash.  Neurological: Negative for weakness and numbness.    Allergies  Review of patient's allergies indicates no known allergies.  Home Medications   Current Outpatient Rx  Name Route Sig Dispense Refill  . ASPIRIN 81 MG PO TABS Oral Take 81 mg by mouth daily.    Marland Kitchen BIMATOPROST 0.01 % OP SOLN Both Eyes Place 1 drop into both eyes at bedtime.    Marland Kitchen BRIMONIDINE TARTRATE-TIMOLOL 0.2-0.5 % OP SOLN Both Eyes Place 1 drop into both eyes every 12 (twelve) hours. 1 drop each eye every 12 hours     . CALCIUM CARBONATE 600 MG PO TABS Oral Take 600 mg by mouth daily.    Marland Kitchen  VITAMIN D 1000 UNITS PO TABS Oral Take 1,000 Units by mouth daily.    Marland Kitchen GABAPENTIN 400 MG PO CAPS Oral Take 800-1,200 mg by mouth 3 (three) times daily. Takes 800 mg in the morning & 800 mg in the evening & 1200 mg at bedtime    . HYDROCHLOROTHIAZIDE 25 MG PO TABS  TAKE 1 TABLET BY MOUTH EVERY DAY 30 tablet 6  . HYDROCODONE-ACETAMINOPHEN 5-500 MG PO TABS Oral Take 1 tablet by mouth every 6 (six) hours as needed for pain. 60 tablet 0    DO NOT FILL UNTIL 08/07/2011. RX TO LAST 60 DAYS.  Marland Kitchen METFORMIN HCL 500 MG PO TABS Oral Take 1 tablet (500 mg total) by mouth 2 (two) times daily with a meal. 60 tablet 3  . NAPROXEN 500 MG PO TABS Oral Take 1 tablet (500 mg total) by mouth 2 (two) times daily with a meal. 60 tablet 2  . OMEPRAZOLE 20 MG PO CPDR Oral Take 1 capsule (20 mg total) by mouth 2 (two) times daily. 60 capsule 3    Please send all prescription refills  electronicall ...  . PRAVASTATIN SODIUM 20 MG PO TABS Oral Take 20 mg by mouth daily.    Marland Kitchen RAMIPRIL 10 MG PO CAPS Oral Take 10 mg by mouth daily.      BP 142/69  Pulse 67  Resp 16  SpO2 97%  Physical Exam  Musculoskeletal:       No midline cervical or paracervical tenderness. FROM neck without radiating pain. No reproducible tenderness on palpation of posterior left shoulder or of left upper extremity. No mass, discoloration, or swelling. FROM of upper extremity with full strength. No sensory deficits.     ED Course  Procedures (including critical care time)  Labs Reviewed - No data to display No results found.  Date: 12/30/2011  Rate: 63  Rhythm: normal sinus rhythm  QRS Axis: normal  Intervals: normal  ST/T Wave abnormalities: normal  Conduction Disutrbances:none  Narrative Interpretation:   Old EKG Reviewed: none available    No diagnosis found.  1. Musculoskeletal left upper extremity pain   MDM  Suspicious for cervical radicular pain in this patient with a history of known lower back disc disease on chronic pain management. No chest pain or pleuritic symptoms to suggest cardiac or pulmonary origin. VSS. Will treat with pain management and discharge home.        Rodena Medin, PA-C 09/08/11 1558  Rodena Medin, PA-C 12/30/11 1021

## 2011-09-08 NOTE — Discharge Instructions (Signed)
CONTINUE YOUR CURRENT MEDICATIONS FOR PAIN CONTROL. FOLLOW UP WITH YOUR DOCTOR FOR RECHECK IN 1-2 DAYS TO MAKE SURE THE PAIN IS IMPROVING.

## 2011-09-08 NOTE — ED Provider Notes (Signed)
4:00 PM Patient signed out by Calton Dach, PA-C.  Plan is to control patient's pain and discharge home when pain is managed.   5:30 PM Reassessed patient.  She reports that her pain has improved at this time.  She reports that she is ready for discharge.  Pascal Lux Willard, PA-C 09/09/11 [phone]

## 2011-09-08 NOTE — ED Provider Notes (Signed)
Medical screening examination/treatment/procedure(s) were performed by non-physician practitioner and as supervising physician I was immediately available for consultation/collaboration.   Dayton Bailiff, MD 09/08/11 [phone]

## 2011-09-12 NOTE — ED Provider Notes (Signed)
Medical screening examination/treatment/procedure(s) were performed by non-physician practitioner and as supervising physician I was immediately available for consultation/collaboration.   Dione Booze, MD 09/12/11 [phone]

## 2011-09-16 ENCOUNTER — Encounter: Payer: Self-pay | Admitting: Family Medicine

## 2011-09-16 ENCOUNTER — Ambulatory Visit (INDEPENDENT_AMBULATORY_CARE_PROVIDER_SITE_OTHER): Payer: PRIVATE HEALTH INSURANCE | Admitting: Family Medicine

## 2011-09-16 VITALS — BP 148/66 | HR 76 | Ht 62.5 in | Wt 217.6 lb

## 2011-09-16 DIAGNOSIS — IMO0002 Reserved for concepts with insufficient information to code with codable children: Secondary | ICD-10-CM

## 2011-09-16 DIAGNOSIS — M79602 Pain in left arm: Secondary | ICD-10-CM

## 2011-09-16 DIAGNOSIS — E119 Type 2 diabetes mellitus without complications: Secondary | ICD-10-CM

## 2011-09-16 DIAGNOSIS — M5410 Radiculopathy, site unspecified: Secondary | ICD-10-CM

## 2011-09-16 DIAGNOSIS — M79609 Pain in unspecified limb: Secondary | ICD-10-CM

## 2011-09-16 MED ORDER — NORTRIPTYLINE HCL 25 MG PO CAPS: 25.0000 mg | ORAL_CAPSULE | Freq: Every day | ORAL | Status: DC

## 2011-09-16 NOTE — Patient Instructions (Addendum)
Cervical Radiculopathy Cervical radiculopathy happens when a nerve in the neck is pinched or bruised by a slipped (herniated) disk or by arthritic changes in the bones of the cervical spine. This can occur due to an injury or as part of the normal aging process. Pressure on the cervical nerves can cause pain or numbness that runs from your neck all the way down into your arm and fingers. CAUSES  There are many possible causes, including:  Injury.   Muscle tightness in the neck from overuse.   Swollen, painful joints (arthritis).   Breakdown or degeneration in the bones and joints of the spine (spondylosis) due to aging.   Bone spurs that may develop near the cervical nerves.  SYMPTOMS  Symptoms include pain, weakness, or numbness in the affected arm and hand. Pain can be severe or irritating. Symptoms may be worse when extending or turning the neck. DIAGNOSIS  Your caregiver will ask about your symptoms and do a physical exam. He or she may test your strength and reflexes. X-rays, CT scans, and MRI scans may be needed in cases of injury or if the symptoms do not go away after a period of time. Electromyography (EMG) or nerve conduction testing may be done to study how your nerves and muscles are working. TREATMENT  Your caregiver may recommend certain exercises to help relieve your symptoms. Cervical radiculopathy can, and often does, get better with time and treatment. If your problems continue, treatment options may include:  Wearing a soft collar for short periods of time.   Physical therapy to strengthen the neck muscles.   Medicines, such as nonsteroidal anti-inflammatory drugs (NSAIDs), oral corticosteroids, or spinal injections.   Surgery. Different types of surgery may be done depending on the cause of your problems.  HOME CARE INSTRUCTIONS   Put ice on the affected area.   Put ice in a plastic bag.   Place a towel between your skin and the bag.   Leave the ice on for 15  to 20 minutes, 3 to 4 times a day or as directed by your caregiver.   Use a flat pillow when you sleep.   Only take over-the-counter or prescription medicines for pain, discomfort, or fever as directed by your caregiver.   If physical therapy was prescribed, follow your caregiver's directions.   If a soft collar was prescribed, use it as directed.  SEEK IMMEDIATE MEDICAL CARE IF:   Your pain gets much worse and cannot be controlled with medicines.   You have weakness or numbness in your hand, arm, face, or leg.   You have a high fever or a stiff, rigid neck.   You lose bowel or bladder control (incontinence).   You have trouble with walking, balance, or speaking.  MAKE SURE YOU:   Understand these instructions.   Will watch your condition.   Will get help right away if you are not doing well or get worse.  Document Released: 12/18/2000 Document Revised: 03/14/2011 Document Reviewed: 11/06/2010 Exit[employer] Patient Information 2012 [employer]. 

## 2011-09-17 ENCOUNTER — Ambulatory Visit
Admission: RE | Admit: 2011-09-17 | Discharge: 2011-09-17 | Disposition: A | Discharge status: Home / Self Care | Payer: PRIVATE HEALTH INSURANCE | Source: Ambulatory Visit | Attending: Family Medicine | Admitting: Family Medicine

## 2011-09-17 DIAGNOSIS — M79602 Pain in left arm: Secondary | ICD-10-CM

## 2011-09-25 ENCOUNTER — Encounter: Payer: Self-pay | Admitting: Home Health Services

## 2011-09-25 ENCOUNTER — Ambulatory Visit (INDEPENDENT_AMBULATORY_CARE_PROVIDER_SITE_OTHER): Payer: PRIVATE HEALTH INSURANCE | Admitting: Home Health Services

## 2011-09-25 VITALS — BP 123/84 | HR 76 | Temp 98.2°F | Ht 62.5 in | Wt 215.9 lb

## 2011-09-25 DIAGNOSIS — Z0000 Encounter for general adult medical examination without abnormal findings: Secondary | ICD-10-CM

## 2011-09-25 DIAGNOSIS — M79602 Pain in left arm: Secondary | ICD-10-CM

## 2011-09-25 DIAGNOSIS — M79609 Pain in unspecified limb: Secondary | ICD-10-CM

## 2011-09-25 DIAGNOSIS — M549 Dorsalgia, unspecified: Secondary | ICD-10-CM

## 2011-09-25 NOTE — Progress Notes (Signed)
Patient here for annual wellness visit, patient reports: Risk Factors/Conditions needing evaluation or treatment: Pt does not have any new risk factors that need evaluation.  Home Safety: Pt lives in story apartment.  Pt reports having smoke detectors and adaptive equipment in the bathroom. Other Information: Corrective lens: Pt wears daily corrective lens. Visits eye dr semi-annually, monitoring glacoma. Dentures: Pt does not have denures.  Visits dentist as needed. Memory: Pt denies memory problems. Patient's Mini Mental Score (recorded in doc. flowsheet): 30  Balance/Gait: PT has polio as a child.  Dependent on cain for mobility, hx of falls.  Balance Abnormal Patient value  Sitting balance    Sit to stand    Attempts to arise    Immediate standing balance    Standing balance    Nudge    Eyes closed- Romberg    Tandem stance    Back lean    Neck Rotation    360 degree turn    Sitting down     Gait Abnormal Patient value  Initiation of gait    Step length-left    Step length-right x   Step height-left    Step height-right x   Step symmetry x   Step continuity    Path deviation    Trunk movement x   Walking stance        Annual Wellness Visit Requirements Recorded Today In  Medical, family, social history Past Medical, Family, Social History Section  Current providers Care team  Current medications Medications  Wt, BP, Ht, BMI Vital signs  Tobacco, alcohol, illicit drug use History  ADL Nurse Assessment  Depression Screening Nurse Assessment  Cognitive impairment Nurse Assessment  Mini Mental Status Document Flowsheet  Fall Risk Nurse Assessment  Home Safety Progress Note  End of Life Planning (welcome visit) Social Documentation  Medicare preventative services Progress Note  Risk factors/conditions needing evaluation/treatment Progress Note  Personalized health advice Patient Instructions, goals, letter  Diet & Exercise Social Documentation  Emergency  Contact Social Documentation  Seat Belts Social Documentation  Sun exposure/protection Social Documentation    Prevention Plan:   Recommended Medicare Prevention Screenings Women over 65 Test For Frequency Date of Last- BOLD if needed  Breast Cancer 1-2 yrs 2012  Cervical Cancer 1-3 yrs NI-hysterecotomy  Colorectal Cancer 1-10 yrs 2010  Osteoporosis once recommended- gave info on making appoint  Cholesterol 5 yrs 2012  Diabetes yearly 2013  HIV yearly declined  Influenza Shot yearly 2012  Pneumonia Shot once 2012  Zostavax Shot once Pt reported done- will bring in shot record.

## 2011-09-25 NOTE — Patient Instructions (Addendum)
1. Continue to focus on eating more fiber and vegetables. 2. Continue to work on weight loss by exercising 1-2 week at [facility]. 3. Bring in shot record for your shingles vaccine. 4. Consider getting bone density screening.

## 2011-09-30 MED ORDER — ASPIRIN 81 MG PO TABS: 81.0000 mg | ORAL_TABLET | Freq: Every day | ORAL | Status: AC

## 2011-09-30 MED ORDER — RAMIPRIL 10 MG PO CAPS: 10.0000 mg | ORAL_CAPSULE | Freq: Every day | ORAL | Status: DC

## 2011-09-30 MED ORDER — GABAPENTIN 400 MG PO CAPS: 800.0000 mg | ORAL_CAPSULE | Freq: Three times a day (TID) | ORAL | Status: DC

## 2011-09-30 MED ORDER — CALCIUM CARBONATE 600 MG PO TABS: 600.0000 mg | ORAL_TABLET | Freq: Every day | ORAL | Status: AC

## 2011-09-30 MED ORDER — HYDROCODONE-ACETAMINOPHEN 5-500 MG PO TABS: 1.0000 | ORAL_TABLET | Freq: Four times a day (QID) | ORAL | Status: AC | PRN

## 2011-09-30 MED ORDER — HYDROCHLOROTHIAZIDE 25 MG PO TABS: 25.0000 mg | ORAL_TABLET | Freq: Every day | ORAL | Status: DC

## 2011-09-30 MED ORDER — METFORMIN HCL 500 MG PO TABS: 500.0000 mg | ORAL_TABLET | Freq: Two times a day (BID) | ORAL | Status: DC

## 2011-09-30 MED ORDER — OMEPRAZOLE 20 MG PO CPDR: 20.0000 mg | DELAYED_RELEASE_CAPSULE | Freq: Two times a day (BID) | ORAL | Status: DC

## 2011-09-30 MED ORDER — NORTRIPTYLINE HCL 25 MG PO CAPS: 25.0000 mg | ORAL_CAPSULE | Freq: Every day | ORAL | Status: DC

## 2011-09-30 MED ORDER — VITAMIN D 1000 UNITS PO TABS: 1000.0000 [IU] | ORAL_TABLET | Freq: Every day | ORAL | Status: DC

## 2011-09-30 MED ORDER — PRAVASTATIN SODIUM 20 MG PO TABS: 20.0000 mg | ORAL_TABLET | Freq: Every day | ORAL | Status: DC

## 2011-09-30 MED ORDER — BRIMONIDINE TARTRATE-TIMOLOL 0.2-0.5 % OP SOLN: 1.0000 [drp] | Freq: Two times a day (BID) | OPHTHALMIC | Status: AC

## 2011-09-30 NOTE — Progress Notes (Signed)
I have reviewed this visit and discussed with Suzanne Lineberry and agree with her documentation  

## 2011-10-06 NOTE — Assessment & Plan Note (Addendum)
Higher concern for cervical radiculopathy.  Will check cervical and shoulder xrays.  Increase neurontin.

## 2011-10-06 NOTE — Assessment & Plan Note (Signed)
Vicodin refilled today.  

## 2011-10-06 NOTE — Progress Notes (Signed)
Subjective:  L arm pain: Pt reports L arm pain  This has been a recurrent issue for several months.  Has radicular pain that radiates down arm.  No L hand numbness or tingling.  Has had some L arm weakness.  No assd CP/chest pressure.  Pt denies any trauma.  DM: Checking CBGs?:yes How Often?:intermittently Blood Sugar Range:160s Polyuria, polydypsia, polyphagia:no Symptomatic Hypoglycemia:no Medication Compliance?:ys ACE if hypertensive/obesity?:ys Statin if LDL >100?:yes      Review of Systems - Negative except as noted above in HPI  Objective:  Current Outpatient Prescriptions  Medication Sig Dispense Refill  . aspirin 81 MG tablet Take 1 tablet (81 mg total) by mouth daily.  90 tablet  3  . bimatoprost (LUMIGAN) 0.01 % SOLN Place 1 drop into both eyes at bedtime.      . brimonidine-timolol (COMBIGAN) 0.2-0.5 % ophthalmic solution Place 1 drop into both eyes every 12 (twelve) hours. 1 drop each eye every 12 hours  30 mL  3  . calcium carbonate (OS-CAL) 600 MG TABS Take 1 tablet (600 mg total) by mouth daily.  90 tablet  3  . cholecalciferol (VITAMIN D) 1000 UNITS tablet Take 1 tablet (1,000 Units total) by mouth daily.  90 tablet  3  . gabapentin (NEURONTIN) 400 MG capsule Take 2-3 capsules (800-1,200 mg total) by mouth 3 (three) times daily. Takes 800 mg in the morning & 800 mg in the evening & 1200 mg at bedtime  900 capsule  3  . hydrochlorothiazide (HYDRODIURIL) 25 MG tablet Take 1 tablet (25 mg total) by mouth daily.  90 tablet  3  . HYDROcodone-acetaminophen (VICODIN) 5-500 MG per tablet Take 1 tablet by mouth every 6 (six) hours as needed for pain.  60 tablet  0  . metFORMIN (GLUCOPHAGE) 500 MG tablet Take 1 tablet (500 mg total) by mouth 2 (two) times daily with a meal.  180 tablet  3  . naproxen (NAPROSYN) 500 MG tablet Take 1 tablet (500 mg total) by mouth 2 (two) times daily with a meal.  60 tablet  2  . nortriptyline (PAMELOR) 25 MG capsule Take 1 capsule (25 mg  total) by mouth at bedtime.  90 capsule  3  . omeprazole (PRILOSEC) 20 MG capsule Take 1 capsule (20 mg total) by mouth 2 (two) times daily.  180 capsule  3  . pravastatin (PRAVACHOL) 20 MG tablet Take 1 tablet (20 mg total) by mouth daily.  90 tablet  3  . ramipril (ALTACE) 10 MG capsule Take 1 capsule (10 mg total) by mouth daily.  90 capsule  3   Current Facility-Administered Medications  Medication Dose Route Frequency Provider Last Rate Last Dose  . pneumococcal 23 valent vaccine (PNU-IMMUNE) injection 0.5 mL  0.5 mL Intramuscular Once Oneal Grout, MPH        Wt Readings from Last 3 Encounters:  09/25/11 215 lb 14.4 oz (97.932 kg)  09/16/11 217 lb 9.6 oz (98.703 kg)  08/22/11 217 lb (98.431 kg)   Temp Readings from Last 3 Encounters:  09/25/11 98.2 F (36.8 C) Oral  08/22/11 98.3 F (36.8 C) Oral  06/07/11 98.7 F (37.1 C) Oral   BP Readings from Last 3 Encounters:  09/25/11 123/84  09/16/11 148/66  09/08/11 115/60   Pulse Readings from Last 3 Encounters:  09/25/11 76  09/16/11 76  09/08/11 67     General: alert and cooperative HEENT: PERRLA Heart: S1, S2 normal, no murmur, rub or gallop, regular rate and  rhythm Lungs: clear to auscultation, no wheezes or rales and unlabored breathing Abdomen: abdomen is soft without significant tenderness, masses, organomegaly or guarding Extremities:  Shoulder: Inspection reveals no abnormalities, atrophy or asymmetry. Palpation is normal with no tenderness over AC joint or bicipital groove. ROM is decreased in all planes. Rotator cuff strength mildly decreased.  No signs of impingement with negative Neer and Hawkin's tests, empty can. Speeds and Yergason's tests normal. No labral pathology noted with negative Obrien's, negative clunk and good stability. Normal scapular function observed. No painful arc and no drop arm sign. No apprehension sign Mildly + spurlings.   Skin:no rashes Neurology: normal without focal  findings   Assessment/Plan:

## 2011-10-06 NOTE — Assessment & Plan Note (Signed)
WIll check A1C.  Otherwise continue current regimen.

## 2011-10-21 ENCOUNTER — Ambulatory Visit: Payer: PRIVATE HEALTH INSURANCE | Attending: Anesthesiology

## 2011-10-21 DIAGNOSIS — IMO0001 Reserved for inherently not codable concepts without codable children: Secondary | ICD-10-CM | POA: Insufficient documentation

## 2011-10-21 DIAGNOSIS — M25619 Stiffness of unspecified shoulder, not elsewhere classified: Secondary | ICD-10-CM | POA: Insufficient documentation

## 2011-10-21 DIAGNOSIS — M25519 Pain in unspecified shoulder: Secondary | ICD-10-CM | POA: Insufficient documentation

## 2011-10-22 ENCOUNTER — Encounter: Payer: Self-pay | Admitting: Emergency Medicine

## 2011-10-25 ENCOUNTER — Ambulatory Visit: Payer: PRIVATE HEALTH INSURANCE

## 2011-10-28 ENCOUNTER — Ambulatory Visit: Payer: PRIVATE HEALTH INSURANCE

## 2011-10-30 ENCOUNTER — Ambulatory Visit: Payer: PRIVATE HEALTH INSURANCE | Admitting: Physical Therapy

## 2011-11-04 ENCOUNTER — Ambulatory Visit: Payer: PRIVATE HEALTH INSURANCE | Admitting: Rehabilitation

## 2011-11-06 ENCOUNTER — Ambulatory Visit: Payer: PRIVATE HEALTH INSURANCE | Admitting: Rehabilitation

## 2011-11-11 ENCOUNTER — Ambulatory Visit: Payer: PRIVATE HEALTH INSURANCE | Attending: Anesthesiology

## 2011-11-11 DIAGNOSIS — M25519 Pain in unspecified shoulder: Secondary | ICD-10-CM | POA: Insufficient documentation

## 2011-11-11 DIAGNOSIS — IMO0001 Reserved for inherently not codable concepts without codable children: Secondary | ICD-10-CM | POA: Insufficient documentation

## 2011-11-11 DIAGNOSIS — M25619 Stiffness of unspecified shoulder, not elsewhere classified: Secondary | ICD-10-CM | POA: Insufficient documentation

## 2012-01-03 ENCOUNTER — Encounter: Payer: Self-pay | Admitting: Emergency Medicine

## 2012-01-03 ENCOUNTER — Ambulatory Visit (INDEPENDENT_AMBULATORY_CARE_PROVIDER_SITE_OTHER): Payer: PRIVATE HEALTH INSURANCE | Admitting: Emergency Medicine

## 2012-01-03 VITALS — BP 128/82 | HR 74 | Ht 62.5 in | Wt 221.9 lb

## 2012-01-03 DIAGNOSIS — E119 Type 2 diabetes mellitus without complications: Secondary | ICD-10-CM

## 2012-01-03 DIAGNOSIS — E785 Hyperlipidemia, unspecified: Secondary | ICD-10-CM

## 2012-01-03 DIAGNOSIS — Z23 Encounter for immunization: Secondary | ICD-10-CM

## 2012-01-03 DIAGNOSIS — I10 Essential (primary) hypertension: Secondary | ICD-10-CM

## 2012-01-03 LAB — BASIC METABOLIC PANEL
Chloride: 103 mEq/L (ref 96–112)
Glucose, Bld: 141 mg/dL — ABNORMAL HIGH (ref 70–99)
Potassium: 4 mEq/L (ref 3.5–5.3)
Sodium: 139 mEq/L (ref 135–145)

## 2012-01-03 MED ORDER — PRAVASTATIN SODIUM 20 MG PO TABS: 20.0000 mg | ORAL_TABLET | Freq: Every day | ORAL | Status: DC

## 2012-01-03 MED ORDER — OMEPRAZOLE 20 MG PO CPDR: 20.0000 mg | DELAYED_RELEASE_CAPSULE | Freq: Two times a day (BID) | ORAL | Status: DC

## 2012-01-03 MED ORDER — GABAPENTIN 400 MG PO CAPS: 800.0000 mg | ORAL_CAPSULE | Freq: Three times a day (TID) | ORAL | Status: DC

## 2012-01-03 MED ORDER — GABAPENTIN 400 MG PO CAPS: ORAL_CAPSULE | ORAL | Status: DC

## 2012-01-03 MED ORDER — METFORMIN HCL 500 MG PO TABS: 500.0000 mg | ORAL_TABLET | Freq: Two times a day (BID) | ORAL | Status: DC

## 2012-01-03 NOTE — Assessment & Plan Note (Signed)
Well controlled by last A1c.  Will check A1c today.  Elevated sugars at home yesterday likely secondary to steroid injection; these should come down over the next week.  Continue metformin 500mg  BID.

## 2012-01-03 NOTE — Progress Notes (Signed)
  Subjective:    Patient ID: [name], female    DOB: 24-Oct-1945, 66 y.o.   MRN: [id number]  HPI [name] is here for f/u.  Diabetes Well controlled: yes Compliant with medications: yes Side effects from medications: no Check sugars at home: yes  Sugar ranges: typically 100-120; was 205 yesterday after a steroid injection in her back, down to 160 this morning  Polyuria: yes - yesterday Polydipsia: no Vision changes: no Hypoglycemic symptoms: no  Foot exam: 08/2011 Eye exam: 10/2011 Microalbumin: normal 08/2011  Hypertension Well controlled: yes Compliant with medication: yes Side effects from medication: no Check BP at home: no  Chest pain: no Palpitations: no Vision changes: no Leg edema: no Dizziness: no  Hyperlipidemia Well controlled: yes Complaint with medication: yes Side effects from medication: no  Refills: Pt needs refills of metformin, pravastatin, gabapentin, and omeprazole  Back/Leg pain: Sees pain doctor who is prescribing Vicodin for her pain as well as doing steroid injections.  I have reviewed and updated the following as appropriate: allergies, current medications and problem list SHx: never smoker  Review of Systems See HPI    Objective:   Physical Exam BP 128/82  Pulse 74  Ht 5' 2.5" (1.588 m)  Wt 221 lb 14.4 oz (100.653 kg)  BMI 39.94 kg/m2 Gen: alert, cooperative, NAD HEENT: AT/[place], sclera white, MMM Neck: no LAD, thyroid normal, supple CV: RRR, no murmurs Pulm: CTAB, no wheezes or rales Ext: no edema     Assessment & Plan:

## 2012-01-03 NOTE — Assessment & Plan Note (Signed)
Well controlled.  Continue current medications of ramipril and HCTZ.

## 2012-01-03 NOTE — Assessment & Plan Note (Addendum)
Well controlled. Continue pravachol. Will check BMP today.  Will check lipids at follow up in 3 months.

## 2012-01-03 NOTE — Patient Instructions (Signed)
It was nice to meet you!  It looks like everything is going well.  We are not going to make any changes to your medicines.  I will call you if anything is wrong with your labs.  Follow up in 3 months.  Please come to this appointment fasting (nothing to eat or drink except water for 8 hours) so we can check your cholesterol.

## 2012-01-06 ENCOUNTER — Encounter: Payer: Self-pay | Admitting: Emergency Medicine

## 2012-01-08 NOTE — ED Provider Notes (Signed)
Medical screening examination/treatment/procedure(s) were performed by non-physician practitioner and as supervising physician I was immediately available for consultation/collaboration.  Hurman Horn, MD 01/08/12 1357

## 2012-03-24 ENCOUNTER — Ambulatory Visit (INDEPENDENT_AMBULATORY_CARE_PROVIDER_SITE_OTHER): Payer: PRIVATE HEALTH INSURANCE | Admitting: Emergency Medicine

## 2012-03-24 ENCOUNTER — Encounter: Payer: Self-pay | Admitting: Emergency Medicine

## 2012-03-24 VITALS — BP 120/81 | HR 71 | Temp 98.4°F | Ht 62.5 in | Wt 221.0 lb

## 2012-03-24 DIAGNOSIS — E785 Hyperlipidemia, unspecified: Secondary | ICD-10-CM

## 2012-03-24 DIAGNOSIS — E119 Type 2 diabetes mellitus without complications: Secondary | ICD-10-CM

## 2012-03-24 DIAGNOSIS — E669 Obesity, unspecified: Secondary | ICD-10-CM

## 2012-03-24 DIAGNOSIS — I10 Essential (primary) hypertension: Secondary | ICD-10-CM

## 2012-03-24 LAB — LIPID PANEL
Cholesterol: 146 mg/dL (ref 0–200)
Total CHOL/HDL Ratio: 2.5 Ratio
VLDL: 19 mg/dL (ref 0–40)

## 2012-03-24 LAB — TSH: TSH: 0.755 u[IU]/mL (ref 0.350–4.500)

## 2012-03-24 MED ORDER — VITAMIN D 1000 UNITS PO TABS: 1000.0000 [IU] | ORAL_TABLET | Freq: Every day | ORAL | Status: DC

## 2012-03-24 MED ORDER — RAMIPRIL 10 MG PO CAPS: 10.0000 mg | ORAL_CAPSULE | Freq: Every day | ORAL | Status: DC

## 2012-03-24 MED ORDER — GABAPENTIN 400 MG PO CAPS: ORAL_CAPSULE | ORAL | Status: DC

## 2012-03-24 MED ORDER — HYDROCHLOROTHIAZIDE 25 MG PO TABS: 25.0000 mg | ORAL_TABLET | Freq: Every day | ORAL | Status: DC

## 2012-03-24 NOTE — Assessment & Plan Note (Signed)
Well-controlled. Will check an A1c today. Continue metformin. Followup in 3 months.

## 2012-03-24 NOTE — Assessment & Plan Note (Signed)
Weight has been stable around 220 pounds. Patient is interested in weight loss. Discussed and encouraged moderate activity for 30 minutes 5 days a week, [facility] She is interested in a nutrition referral. I will place a referral for Dr. Gerilyn Pilgrim.  I will also check a TSH.

## 2012-03-24 NOTE — Assessment & Plan Note (Signed)
Well-controlled. Will check fasting lipid panel today. Continue pravastatin.

## 2012-03-24 NOTE — Progress Notes (Signed)
  Subjective:    Patient ID: [name], female    DOB: 06-12-1945, 66 y.o.   MRN: [id number]  HPI [name] is here for f/u.  Hypertension Well controlled: yes Compliant with medication: yes Side effects from medication: no Check BP at home: no  Chest pain: no Palpitations: no Vision changes: no Leg edema: no Dizziness: no  Diabetes Well controlled: yes Compliant with medications: yes Side effects from medications: no Check sugars at home: yes - intermittently  Sugar ranges: 109-115  Polyuria: no Polydipsia: no Vision changes: no Hypoglycemic symptoms: no  Foot exam: 08/2011 Eye exam: 10/2011 Microalbumin: n/a on ACEi  Hyperlipidemia Well controlled: yes Compliant with medication: yes Side effects from medication: no  Obesity Patient reports being interested in weight loss. She is not very active. She states that occasionally she will take the bus to either [employer] or the mall and walk. She does drink occasional soda. Does not drink any sweet tea. Diet is heavy on carbohydrates.  I have reviewed and updated the following as appropriate: allergies and current medications SHx: never smoker  Review of Systems See HPI    Objective:   Physical Exam BP 120/81  Pulse 71  Temp 98.4 F (36.9 C) (Oral)  Ht 5' 2.5" (1.588 m)  Wt 221 lb (100.245 kg)  BMI 39.78 kg/m2 Gen: alert, cooperative, NAD HEENT: AT/[place], sclera white, MMM CV: RRR, no murmurs Pulm: CTAB, no wheezes or rales Ext: no edema, 2+ DP pulses bilaterally, right leg wasted compared to left (h/o of Polio)     Assessment & Plan:

## 2012-03-24 NOTE — Assessment & Plan Note (Signed)
Well controlled. Continue current medications  

## 2012-03-24 NOTE — Patient Instructions (Signed)
It was good to see you! Everything looks good. We are going to check some labs today.  I will send a letter with the results in 1-2 weeks or call if anything is wrong. Follow up in 3 months.

## 2012-03-25 ENCOUNTER — Encounter: Payer: Self-pay | Admitting: Emergency Medicine

## 2012-03-30 ENCOUNTER — Telehealth: Payer: Self-pay | Admitting: *Deleted

## 2012-03-30 NOTE — Telephone Encounter (Signed)
[facility] calling for NPI # to authorize for visual field appt patient had today.  NPI # given.  Gaylene Brooks, RN

## 2012-04-27 ENCOUNTER — Encounter: Payer: Self-pay | Admitting: Family Medicine

## 2012-04-27 ENCOUNTER — Ambulatory Visit (INDEPENDENT_AMBULATORY_CARE_PROVIDER_SITE_OTHER): Payer: PRIVATE HEALTH INSURANCE | Admitting: Family Medicine

## 2012-04-27 VITALS — Ht 62.5 in | Wt 216.8 lb

## 2012-04-27 DIAGNOSIS — E119 Type 2 diabetes mellitus without complications: Secondary | ICD-10-CM

## 2012-04-27 NOTE — Patient Instructions (Addendum)
-   ANY TIME you come to the [facility], [facility] your blood sugar log! - Call [employer] to ask about a new meter and strips approved.    Diet Recommendations for Diabetes   Starchy (carb) foods include: Bread, rice, pasta, potatoes, corn, crackers, bagels, muffins, all baked goods.   - Sugary foods & drinks also count as carb foods, e.g., ginger ale   Protein foods include: Meat, fish, poultry, eggs, dairy foods, and beans such as pinto and kidney beans (beans also provide carbohydrate).   1. Eat at least 3 meals and 0-1 snack per day. Never go more than 4-5 hours while awake without eating.   - Good snacks:  Fruit, yogurt, 1/2 sandwich, Raisin Bran with milk.   2. Limit starchy foods to TWO per meal and ONE per snack. ONE portion of a starchy  food is equal to the following:   - ONE slice of bread (or its equivalent, such as half of a hamburger bun).   - 1/2 cup of a "scoopable" starchy food such as potatoes or rice.   - 1 OUNCE (28 grams) of starchy snack foods such as crackers or pretzels (look on label).   - 15 grams of carbohydrate as shown on food label.  3. Both lunch and dinner should include a protein food, a carb food, and vegetables.   - Obtain twice as many veg's as protein or carbohydrate foods for both lunch and dinner.   - Try to keep frozen veg's on hand for a quick vegetable serving.     - Fresh or frozen veg's are best.  4. Breakfast should always include protein.    - Please schedule at the front desk your next appt for Feb 17 at 1:30.

## 2012-04-27 NOTE — Progress Notes (Signed)
Medical Nutrition Therapy:  Appt start time: 1100 end time:  1200.  Assessment:  Primary concerns today: metabolic syndrome.  [name] lives alone.  She likes most foods, and drinks mainly water for beverages.   Usual eating pattern includes 2 meals (skips lunch or breakfast) and 0-1 snacks per day. Usual physical activity includes none d/t back problems; occasionally walks at the mall or at [facility] when there. Aarohi checks her BG at different times - fasting (~1-2 X wk) or in response to feeling bad.  FBG has recently been 109-130s.   Everyday foods include water, Progresso soup 3-4 X wk.  Avoided foods include none.    24-hr recall: (Up at 7) B (8 AM)-   1 1/2 c coffee w/ 1 1/2 tsp powdered creamer Snk ( AM)-   none L (3:30 PM)-  1 1/2 pc cornbread, 2 c collards (seasoned w/ veg oil), 2 boiled pigs feet, 12 oz ginger ale = >3 1/2 starches per meal Snk ( PM)-  none D ( PM)-  none Snk ( PM)-  none Yesterday was atypical in that she had church, so had no breakfast, and the schedule was thrown off.  Typical breakfasts include instant oatmeal (9 g sugar) or grits or 2 eggs with toast.    Progress Towards Goal(s):  In progress.   Nutritional Diagnosis:  [place]-3.3 Overweight/obesity As related to dietary pattern & food choices.  As evidenced by usual pattern of 2 meals a day with occasional sweets.    Intervention:  Nutrition 2 education.  Monitoring/Evaluation:  Dietary intake, exercise, BG, and body weight in 4 week(s).

## 2012-05-25 ENCOUNTER — Ambulatory Visit (INDEPENDENT_AMBULATORY_CARE_PROVIDER_SITE_OTHER): Payer: PRIVATE HEALTH INSURANCE | Admitting: Family Medicine

## 2012-05-25 ENCOUNTER — Encounter: Payer: Self-pay | Admitting: Family Medicine

## 2012-05-25 VITALS — Ht 62.5 in | Wt 218.0 lb

## 2012-05-25 DIAGNOSIS — E669 Obesity, unspecified: Secondary | ICD-10-CM

## 2012-05-25 DIAGNOSIS — E119 Type 2 diabetes mellitus without complications: Secondary | ICD-10-CM

## 2012-05-25 NOTE — Patient Instructions (Addendum)
-   Eat at least 3 meals and 1-2 snacks per day.  Aim for no more than 5 hours between eating.  - Have breakfast no later than 1 hour after getting up.    - Tue & Thur:  Just eat as soon as you get to church.    - Sunday:  Try to have at least a light breakfast before church.  - Salad dressing, mayonnaise:  Try a low-fat version of your favorite (not fat-free).   - Bread:  Whole-grain or whole-wheat is best.  One way to tell if it is nutritious is to check the fiber content.  The more fiber, the better (should have at least 2 grams per slice).   - Snacks:  Fresh fruit, yogurt, small bowl cereal w/ milk, string cheese.   GOALS - Eat at least 3 meals and 1 snack per day.  Aim for no more than 5 hours between eating.  - Have breakfast no later than 1 hour after getting up.    - Tue & Thur:  Just eat as soon as you get to church.    - Sunday:  Try to have at least a light breakfast before church.  - This may require PLANNING AHEAD! - Limit carb foods to 1-2 portions per meal.    - This week, measure your starchy (carb) foods to see what 1/2 cup looks like.  - Include vegetables at least twice a day.    NEXT APPT: 9 am MON, MAR 17.

## 2012-05-25 NOTE — Progress Notes (Signed)
Medical Nutrition Therapy:  Appt start time: 1100 end time:  1200.  Assessment:  Primary concerns today: metabolic syndrome.  Branae said she has not really been able to purchase foods she needs, nor has she gotten a new glucometer, but she did get the information from her insurance co as to which meter she can get.  She has a form from [employer] that Dr. Elwyn Reach needs to complete and fax for Otilia to get her supplies mailed to her.  She had numerous questions today, and said she hopes to get to the grocery store tomorrow, so will be better able to follow recommendations for veg's.    24-hr recall:  (Up at 7 AM) B (8 AM)-  1 1/2 c coffee w/ 1/2 tbsp cream Snk ( AM)-  Sunday school, worship from ~9:30 till home ~1:50 L (3 PM)-  Steak w/ onions & gravy, 1 c green beans & corn, 1/2 c instant mashed potatoes, water Snk ( PM)-  none D (6:30 PM)-  Lett & tomato salad w/ 1 1/2 tbsp mayo dressing, water Snk ( PM)-  None Again, yesterday's intake was on a Sunday, so not representative of a usual day.  Breakfast was only coffee & one sausage link this morning. Lunch was 1-2 oz cheese, 4crackers, and water.  Progress Towards Goal(s):  In progress.   Nutritional Diagnosis:  [place]-3.3 Overweight/obesity As related to dietary pattern & food choices.  As evidenced by usual pattern of 2 meals a day with occasional sweets.    Intervention:  Nutrition education.  Monitoring/Evaluation:  Dietary intake, exercise, BG, and body weight in 4 week(s).

## 2012-06-08 ENCOUNTER — Ambulatory Visit (INDEPENDENT_AMBULATORY_CARE_PROVIDER_SITE_OTHER): Payer: PRIVATE HEALTH INSURANCE | Admitting: Emergency Medicine

## 2012-06-08 ENCOUNTER — Encounter: Payer: Self-pay | Admitting: Emergency Medicine

## 2012-06-08 VITALS — BP 154/87 | HR 74 | Temp 98.0°F | Ht 62.0 in | Wt 216.0 lb

## 2012-06-08 DIAGNOSIS — I10 Essential (primary) hypertension: Secondary | ICD-10-CM

## 2012-06-08 DIAGNOSIS — E119 Type 2 diabetes mellitus without complications: Secondary | ICD-10-CM

## 2012-06-08 DIAGNOSIS — E669 Obesity, unspecified: Secondary | ICD-10-CM

## 2012-06-08 LAB — BASIC METABOLIC PANEL
BUN: 9 mg/dL (ref 6–23)
CO2: 28 mEq/L (ref 19–32)
Chloride: 101 mEq/L (ref 96–112)
Creat: 0.68 mg/dL (ref 0.50–1.10)

## 2012-06-08 MED ORDER — GABAPENTIN 400 MG PO CAPS: ORAL_CAPSULE | ORAL | Status: DC

## 2012-06-08 NOTE — Assessment & Plan Note (Signed)
Elevated today, but she has not taken her morning meds yet.  Typically well controlled.  Will continue current medications.  Check BMP today.  Follow up in 3 months.

## 2012-06-08 NOTE — Progress Notes (Signed)
  Subjective:    Patient ID: [name], female    DOB: [birth year], 67 y.o.   MRN: [id number]  HPI Daisha Filosa is here for f/u of chronic issues.  Hypertension Well controlled: no - did not take her medications this morning yet. Compliant with medication: yes Side effects from medication: no Check BP at home: no  Chest pain: no Palpitations: no Vision changes: no Leg edema: no Dizziness: no  Diabetes Well controlled: yes Compliant with medications: yes Side effects from medications: no Check sugars at home: yes  Polyuria: no Polydipsia: no Vision changes: no Hypoglycemic symptoms: no  She does report some concern about her kidneys as kidney disease runs in her family and she knows she has risk factors.  Foot exam: done today Eye exam: scheduled for 07/06/12 Microalbumin: n/a on ACE-I  Obesity Reports this are going well with Dr. Gerilyn Pilgrim.  I have reviewed and updated the following as appropriate: allergies, current medications, past family history, past medical history, past social history, past surgical history and problem list SHx: never smoker   Review of Systems See HPI    Objective:   Physical Exam BP 154/87  Pulse 74  Temp(Src) 98 F (36.7 C) (Oral)  Ht 5\' 2"  (1.575 m)  Wt 216 lb (97.977 kg)  BMI 39.5 kg/m2 Gen: alert, cooperative, NAD HEENT: AT/[place], sclera white, MMM Neck: supple CV: RRR, no murmurs Pulm: CTAB, no wheezes or rales Ext: no edema Feet: 5/5 to sensation, no rashes, lesions, calluses noted      Assessment & Plan:

## 2012-06-08 NOTE — Patient Instructions (Addendum)
It was nice to see you! We are going to check some labs.  I will call you if anything is wrong, otherwise you will get a letter in the mail in 1-2 weeks. Follow up in 3 months.

## 2012-06-08 NOTE — Assessment & Plan Note (Signed)
Weight stable to slightly decreased.  Encouraged her to continue seeing Dr. Ardeen Garland.

## 2012-06-08 NOTE — Assessment & Plan Note (Signed)
Well controlled.  Will check A1c today.  No changes to medications.

## 2012-06-09 ENCOUNTER — Encounter: Payer: Self-pay | Admitting: Emergency Medicine

## 2012-06-19 ENCOUNTER — Encounter: Payer: Self-pay | Admitting: Family Medicine

## 2012-06-19 ENCOUNTER — Ambulatory Visit (INDEPENDENT_AMBULATORY_CARE_PROVIDER_SITE_OTHER): Payer: PRIVATE HEALTH INSURANCE | Admitting: Family Medicine

## 2012-06-19 VITALS — BP 124/83 | HR 61 | Ht 62.5 in | Wt 220.0 lb

## 2012-06-19 DIAGNOSIS — R109 Unspecified abdominal pain: Secondary | ICD-10-CM

## 2012-06-19 LAB — POCT UA - MICROSCOPIC ONLY

## 2012-06-19 LAB — POCT URINALYSIS DIPSTICK
Blood, UA: NEGATIVE
Glucose, UA: NEGATIVE
Spec Grav, UA: 1.02
Urobilinogen, UA: 0.2
pH, UA: 6

## 2012-06-19 MED ORDER — BACLOFEN 10 MG PO TABS: 10.0000 mg | ORAL_TABLET | Freq: Three times a day (TID) | ORAL | Status: DC | PRN

## 2012-06-19 MED ORDER — KETOROLAC TROMETHAMINE 60 MG/2ML IM SOLN
60.0000 mg | Freq: Once | INTRAMUSCULAR | Status: AC
  Administered 2012-06-19: 60 mg via INTRAMUSCULAR

## 2012-06-21 NOTE — Assessment & Plan Note (Signed)
UA does not indicate urinary infection or possible stone.  I think this is likely coming from muscle spasm.  Given toradol in clinic and will continue on home hydrocodone.  Will add muscle relaxer.  Given instructions to follow up if not improving.

## 2012-06-21 NOTE — Progress Notes (Signed)
  Subjective:    Patient ID: [name], female    DOB: 04-Mar-1946, 67 y.o.   MRN: [id number]  HPI  1. Flank pain:  C/o L flank pain off and on x2 weeks.  Has never had pain like this before. Pain worsened with certain movements.  Has been using hydrocodone at home which eases the pain.   She denies trauma, dysuria, hematuria, nausea or vomiting, fever, chills.   Review of Systems Per HPI    Objective:   Physical Exam  Constitutional: She appears well-nourished. No distress.  Cardiovascular: Normal rate and regular rhythm.   Abdominal: Soft. Bowel sounds are normal. She exhibits no distension.  Musculoskeletal:  No CVA tenderness.  No bony tenderness and no stepoff palpated.  Tenderness and spasm of paraspinal musculature on the L. ROM limited due to pain.             Assessment & Plan:

## 2012-07-30 ENCOUNTER — Telehealth: Payer: Self-pay | Admitting: Emergency Medicine

## 2012-07-30 NOTE — Telephone Encounter (Signed)
The patient is calling because she says Angelique Blonder was to have Dr. Deirdre Priest complete a form for her Diabetes supplies because Dr. Elwyn Reach wasn't authorized to and she was checking to make sure this has happened.

## 2012-07-31 NOTE — Telephone Encounter (Signed)
Will fwd. To PCP for review? Lorenda Hatchet, Avo Schlachter (Do not see in chart)

## 2012-07-31 NOTE — Telephone Encounter (Signed)
This form was placed in Dr. Cyndia Skeeters box for signature 1-2 weeks ago.  She should contact her supplier and make sure they received it.  If they haven't, they can resend [place] the form.

## 2012-08-11 ENCOUNTER — Telehealth: Payer: Self-pay | Admitting: *Deleted

## 2012-08-11 NOTE — Telephone Encounter (Signed)
I do not consider a heating pad a medical necessity.  I did not complete the form.

## 2012-08-11 NOTE — Telephone Encounter (Signed)
Fax from [employer] placed in your box for heating pad approval Fe Okubo, Harold Hedge, RN

## 2012-08-18 ENCOUNTER — Encounter: Payer: Self-pay | Admitting: Emergency Medicine

## 2012-08-18 ENCOUNTER — Ambulatory Visit (INDEPENDENT_AMBULATORY_CARE_PROVIDER_SITE_OTHER): Payer: PRIVATE HEALTH INSURANCE | Admitting: Emergency Medicine

## 2012-08-18 VITALS — BP 102/65 | HR 64 | Temp 98.2°F | Ht 62.5 in | Wt 216.1 lb

## 2012-08-18 DIAGNOSIS — I10 Essential (primary) hypertension: Secondary | ICD-10-CM

## 2012-08-18 DIAGNOSIS — E785 Hyperlipidemia, unspecified: Secondary | ICD-10-CM

## 2012-08-18 DIAGNOSIS — E119 Type 2 diabetes mellitus without complications: Secondary | ICD-10-CM

## 2012-08-18 MED ORDER — ATORVASTATIN CALCIUM 40 MG PO TABS: 40.0000 mg | ORAL_TABLET | Freq: Every day | ORAL | Status: DC

## 2012-08-18 NOTE — Progress Notes (Signed)
  Subjective:    Patient ID: [name], female    DOB: 09-29-1945, 67 y.o.   MRN: [id number]  HPI [name] is here for f/u of chronic medical issues.  Hypertension Well controlled: yes Compliant with medication: yes Side effects from medication: no Check BP at home: no  Chest pain: no Palpitations: no Vision changes: no Leg edema: no Dizziness: no  Diabetes Well controlled: yes Compliant with medications: yes Side effects from medications: no Check sugars at home: no - has not checked in last 3-4 months due to difficulty getting supplies  Polyuria: yes - when her sugar is up; happens irregularly Polydipsia: no Vision changes: no Hypoglycemic symptoms: no Concerned about her feet as the bottoms have been feeling "funny" for the last 2-3 months.  Foot exam: done today Eye exam: 08/17/2012 Microalbumin: n/a on ace-i  Hyperlipidemia Well controlled: yes Complaint with medications: yes Side effects from medication: no  I have reviewed and updated the following as appropriate: allergies and current medications SHx: never smoker  Review of Systems See HPI    Objective:   Physical Exam BP 102/65  Pulse 64  Temp(Src) 98.2 F (36.8 C) (Oral)  Ht 5' 2.5" (1.588 m)  Wt 216 lb 1.6 oz (98.022 kg)  BMI 38.87 kg/m2 Gen: alert, cooperative, NAD HEENT: AT/[place], sclera white, MMM Neck: supple CV: RRR, no murmurs Pulm: CTAB, no wheezes or rales Ext: no edema, 2+ DP pulses bilaterally Foot: no calluses or ulcers notes; 5/5 monofilament on right, 3/5 on left     Assessment & Plan:

## 2012-08-18 NOTE — Assessment & Plan Note (Signed)
Well controlled.  Continue current medications.  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  Already on gabapentin.  Discussed foot care.  Discussed that she does not need to test her sugar at home as she is not on insulin.  She has always tested at home, especially if she has symptoms of hyperglycemia.  Her supplier will be contacting me for prescription for supplies.  Follow up in 2-3 months for A1c.

## 2012-08-18 NOTE — Assessment & Plan Note (Signed)
Extensive discussion regarding new guidelines.  ASCVD risk 14.1%, per guidelines needs high-intensity statin therapy.  Will change to lipitor 40mg  daily.  Patient agrees with this plan.  Discussed possible myalgias.  Will likely check LFTs at follow up.

## 2012-08-18 NOTE — Patient Instructions (Signed)
It was nice to see you! Please stop taking Pravachol. Start Lipitor 40mg  once a day. Follow up in 2-3 months.

## 2012-08-18 NOTE — Assessment & Plan Note (Signed)
Well controlled. Continue current medications  

## 2012-09-04 ENCOUNTER — Encounter: Payer: Self-pay | Admitting: Emergency Medicine

## 2012-10-15 ENCOUNTER — Other Ambulatory Visit: Payer: Self-pay

## 2012-10-26 ENCOUNTER — Encounter: Payer: Self-pay | Admitting: Home Health Services

## 2012-11-09 ENCOUNTER — Encounter: Payer: Self-pay | Admitting: Home Health Services

## 2012-11-09 ENCOUNTER — Ambulatory Visit (INDEPENDENT_AMBULATORY_CARE_PROVIDER_SITE_OTHER): Payer: PRIVATE HEALTH INSURANCE | Admitting: Home Health Services

## 2012-11-09 VITALS — BP 115/79 | HR 65 | Temp 97.6°F | Ht 62.5 in | Wt 211.0 lb

## 2012-11-09 DIAGNOSIS — Z0000 Encounter for general adult medical examination without abnormal findings: Secondary | ICD-10-CM

## 2012-11-09 NOTE — Progress Notes (Signed)
Patient here for annual wellness visit, patient reports: Risk Factors/Conditions needing evaluation or treatment: Pt does not have any new risk factors that need evaluation. Home Safety: Pt lives by self.  Pt reports having smoke detectors and no adaptive equipment in the bathroom.  Other Information: Corrective lens: Pt wears daily corrective lens.  Pt reports having annual eye exams for glaucoma monitoring. Dentures: Pt does not have dentures. Has bi-annual dental exams. Memory: Pt denies any memory problems. Patient's Mini Mental Score (recorded in doc. flowsheet): 30  Balance/Gait: Pt reports no falls in the past year.  Dependent on cane for ambulation. Had polio as a child. Pt reports no vision or hearing problems.     Annual Wellness Visit Requirements Recorded Today In  Medical, family, social history Past Medical, Family, Social History Section  Current providers Care team  Current medications Medications  Wt, BP, Ht, BMI Vital signs  Tobacco, alcohol, illicit drug use History  ADL Nurse Assessment  Depression Screening Nurse Assessment  Cognitive impairment Nurse Assessment  Mini Mental Status Document Flowsheet  Fall Risk Fall/Depression  Home Safety Progress Note  End of Life Planning (welcome visit) Social Documentation  Medicare preventative services Progress Note  Risk factors/conditions needing evaluation/treatment Progress Note  Personalized health advice Patient Instructions, goals, letter  Diet & Exercise Social Documentation  Emergency Contact Social Documentation  Seat Belts Social Documentation  Sun exposure/protection Social Documentation

## 2012-11-10 ENCOUNTER — Other Ambulatory Visit: Payer: Self-pay | Admitting: *Deleted

## 2012-11-11 ENCOUNTER — Ambulatory Visit: Payer: PRIVATE HEALTH INSURANCE | Admitting: Home Health Services

## 2012-11-11 ENCOUNTER — Other Ambulatory Visit: Payer: Self-pay | Admitting: *Deleted

## 2012-11-13 MED ORDER — RAMIPRIL 10 MG PO CAPS: 10.0000 mg | ORAL_CAPSULE | Freq: Every day | ORAL | Status: DC

## 2012-11-13 MED ORDER — VITAMIN D 1000 UNITS PO TABS: 1000.0000 [IU] | ORAL_TABLET | Freq: Every day | ORAL | Status: DC

## 2012-11-13 NOTE — Telephone Encounter (Signed)
Patient is calling because her Rx's for Vitamin D and Ramipril still have not been sent to the pharmacy and the request was sent on 8/6.  Her niece will going back to the pharmacy at noon today and she would like to be able to pick them up so that the patient will have her meds for the weekend.

## 2012-11-13 NOTE — Telephone Encounter (Signed)
Sent to pharmacy and called in to verify since pt will be out before Dr. Elwyn Reach returns - will need appointment before further refills. Wyatt Haste, RN-BSN

## 2012-11-16 ENCOUNTER — Encounter: Payer: Self-pay | Admitting: Home Health Services

## 2012-11-16 NOTE — Progress Notes (Signed)
Patient ID: [name], female   DOB: Jul 28, 1945, 67 y.o.   MRN: [id number]  I have reviewed this visit and discussed with Arlys John and agree with her documentation.  BOOTH, Tova Vater 11/16/2012, 8:51 AM

## 2012-11-30 ENCOUNTER — Ambulatory Visit (INDEPENDENT_AMBULATORY_CARE_PROVIDER_SITE_OTHER): Payer: PRIVATE HEALTH INSURANCE | Admitting: Emergency Medicine

## 2012-11-30 ENCOUNTER — Other Ambulatory Visit (HOSPITAL_COMMUNITY)
Admission: RE | Admit: 2012-11-30 | Discharge: 2012-11-30 | Disposition: A | Discharge status: Home / Self Care | Payer: PRIVATE HEALTH INSURANCE | Source: Ambulatory Visit | Attending: Emergency Medicine | Admitting: Emergency Medicine

## 2012-11-30 ENCOUNTER — Encounter: Payer: Self-pay | Admitting: Emergency Medicine

## 2012-11-30 VITALS — BP 109/72 | HR 73 | Temp 98.1°F | Ht 62.0 in | Wt 213.0 lb

## 2012-11-30 DIAGNOSIS — E119 Type 2 diabetes mellitus without complications: Secondary | ICD-10-CM

## 2012-11-30 DIAGNOSIS — E1142 Type 2 diabetes mellitus with diabetic polyneuropathy: Secondary | ICD-10-CM

## 2012-11-30 DIAGNOSIS — Z124 Encounter for screening for malignant neoplasm of cervix: Secondary | ICD-10-CM | POA: Insufficient documentation

## 2012-11-30 DIAGNOSIS — I10 Essential (primary) hypertension: Secondary | ICD-10-CM

## 2012-11-30 DIAGNOSIS — E785 Hyperlipidemia, unspecified: Secondary | ICD-10-CM

## 2012-11-30 DIAGNOSIS — E1149 Type 2 diabetes mellitus with other diabetic neurological complication: Secondary | ICD-10-CM

## 2012-11-30 LAB — POCT GLYCOSYLATED HEMOGLOBIN (HGB A1C): Hemoglobin A1C: 6.6

## 2012-11-30 MED ORDER — OMEPRAZOLE 20 MG PO CPDR: 20.0000 mg | DELAYED_RELEASE_CAPSULE | Freq: Two times a day (BID) | ORAL | Status: DC

## 2012-11-30 MED ORDER — HYDROCHLOROTHIAZIDE 25 MG PO TABS: 25.0000 mg | ORAL_TABLET | Freq: Every day | ORAL | Status: DC

## 2012-11-30 MED ORDER — METFORMIN HCL 500 MG PO TABS: 500.0000 mg | ORAL_TABLET | Freq: Two times a day (BID) | ORAL | Status: DC

## 2012-11-30 NOTE — Assessment & Plan Note (Signed)
Well controlled. Continue lipitor 40mg  daily for primary prevention.

## 2012-11-30 NOTE — Assessment & Plan Note (Signed)
Well controlled. Continue metformin.

## 2012-11-30 NOTE — Patient Instructions (Addendum)
It was nice to see you!  Everything is going well.  You can take extra strength tylenol up to 2 tabs 3 times a day to help with aches and pains.  I will send you a letter with the results of your pap smear.  As long as it is normal, you do not need any more pap smears.  Follow up in 6 months or sooner as needed.

## 2012-11-30 NOTE — Assessment & Plan Note (Signed)
Well controlled. Continue current medications - HCTZ and ramipril.

## 2012-11-30 NOTE — Progress Notes (Signed)
  Subjective:    Patient ID: [name], female    DOB: 10-24-1945, 67 y.o.   MRN: [id number]  HPI [name] is here for annual exam.  Hypertension Well controlled: yes Compliant with medication: yes Side effects from medication: no Check BP at home: no  Chest pain: no Palpitations: no Vision changes: no Leg edema: no Dizziness: no  Diabetes Well controlled: yes Compliant with medications: yes Side effects from medications: no Check sugars at home: no  Polyuria: no Polydipsia: no Vision changes: no Hypoglycemic symptoms: no  Hyperlipidemia Well controlled: yes Compliant with medication: yes Side effects from medication: no  I have reviewed and updated the following as appropriate: allergies, current medications, past family history, past medical history, past social history, past surgical history and problem list SHx: non smoker - mammogram due in December - colonoscopy due in 2020  Review of Systems Patient Information Form: Screening and ROS  Review of Symptoms  General:  Negative for nexplained weight loss, fever Skin: Negative for new or changing mole, sore that won't heal HEENT: Negative for trouble hearing, trouble seeing, ringing in ears, mouth sores, hoarseness, change in voice, dysphagia. CV:  Negative for chest pain, dyspnea, edema, palpitations Resp: Negative for cough, dyspnea, hemoptysis GI: Negative for nausea, vomiting, diarrhea, constipation, abdominal pain, melena, hematochezia. GU: Negative for dysuria, incontinence, urinary hesitance, hematuria, vaginal or penile discharge, polyuria, sexual difficulty, lumps in testicle or breasts MSK: Negative for muscle cramps or aches, + joint pain or swelling Neuro: Negative for headaches, weakness, numbness, dizziness, passing out/fainting Psych: Negative for depression, anxiety, memory problems      Objective:   Physical Exam BP 109/72  Pulse 73  Temp(Src) 98.1 F (36.7 C) (Oral)   Ht 5\' 2"  (1.575 m)  Wt 213 lb (96.616 kg)  BMI 38.95 kg/m2 Gen: alert, cooperative, NAD HEENT: AT/[place], sclera white, PERRL, MMM, no pharyngeal erythema or edema Neck: supple, no LAD, no carotid bruits CV: RRR, no murmurs Pulm: CTAB, no wheezes or rales Abd: +BS, soft, NTND Ext: no edema, 2+ DP pulses bilaterally Pelvic: normal external genitalia, normal vagina, no cervix identified (s/p hysterectomy); unable to palpate either ovary on bimanual     Assessment & Plan:

## 2012-12-02 ENCOUNTER — Encounter: Payer: Self-pay | Admitting: Emergency Medicine

## 2013-03-12 ENCOUNTER — Other Ambulatory Visit: Payer: Self-pay | Admitting: Emergency Medicine

## 2013-03-17 ENCOUNTER — Telehealth: Payer: Self-pay | Admitting: Licensed Clinical Social Worker

## 2013-03-17 NOTE — Telephone Encounter (Signed)
Message copied by Harvie Bridge on Wed Mar 17, 2013  5:28 PM ------      Message from: Charm Rings      Created: Mon Mar 15, 2013  8:52 AM      Regarding: RE: lipid panel       She had a lipid panel 03/24/2012 that showed excellent control.  Under the new guidelines, I would plan of rechecking in 4-5 years.  If her insurance requires it, I will get a repeat panel at her f/u visit in February.            Denny Peon      (sorry if got this message twice)      ----- Message -----         From: Starleen Arms, CMA         Sent: 03/15/2013   8:17 AM           To: Charm Rings, MD      Subject: lipid panel                                              Patient is due for Lipid panel, I am working on [facility] and this is a requirement for [employer].       ------

## 2013-03-29 ENCOUNTER — Telehealth: Payer: Self-pay | Admitting: Emergency Medicine

## 2013-03-29 NOTE — Telephone Encounter (Signed)
Form put in Dr.Honig's box .[name]  

## 2013-03-29 NOTE — Telephone Encounter (Signed)
Pt brought in form to be completed by dr for glucose meter for insurance Needs to be completed by 04-08-13

## 2013-04-05 NOTE — Telephone Encounter (Signed)
Pt notified.  Elo Marmolejos L, CMA  

## 2013-04-05 NOTE — Telephone Encounter (Signed)
Form for new glucometer completed and placed in to be faxed box.

## 2013-05-13 DIAGNOSIS — M545 Low back pain, unspecified: Secondary | ICD-10-CM | POA: Insufficient documentation

## 2013-05-13 DIAGNOSIS — G8929 Other chronic pain: Secondary | ICD-10-CM | POA: Insufficient documentation

## 2013-05-13 DIAGNOSIS — M5417 Radiculopathy, lumbosacral region: Secondary | ICD-10-CM | POA: Insufficient documentation

## 2013-06-08 ENCOUNTER — Ambulatory Visit (INDEPENDENT_AMBULATORY_CARE_PROVIDER_SITE_OTHER): Payer: PRIVATE HEALTH INSURANCE | Admitting: Emergency Medicine

## 2013-06-08 ENCOUNTER — Encounter: Payer: Self-pay | Admitting: Emergency Medicine

## 2013-06-08 VITALS — BP 132/82 | HR 71 | Temp 97.5°F | Ht 62.0 in | Wt 211.0 lb

## 2013-06-08 DIAGNOSIS — I10 Essential (primary) hypertension: Secondary | ICD-10-CM

## 2013-06-08 DIAGNOSIS — E1142 Type 2 diabetes mellitus with diabetic polyneuropathy: Secondary | ICD-10-CM

## 2013-06-08 DIAGNOSIS — E785 Hyperlipidemia, unspecified: Secondary | ICD-10-CM

## 2013-06-08 DIAGNOSIS — Z96659 Presence of unspecified artificial knee joint: Secondary | ICD-10-CM

## 2013-06-08 DIAGNOSIS — B9789 Other viral agents as the cause of diseases classified elsewhere: Secondary | ICD-10-CM

## 2013-06-08 DIAGNOSIS — J069 Acute upper respiratory infection, unspecified: Secondary | ICD-10-CM

## 2013-06-08 DIAGNOSIS — Z96652 Presence of left artificial knee joint: Secondary | ICD-10-CM

## 2013-06-08 DIAGNOSIS — E1149 Type 2 diabetes mellitus with other diabetic neurological complication: Secondary | ICD-10-CM

## 2013-06-08 LAB — HEPATITIS C ANTIBODY: HCV AB: NEGATIVE

## 2013-06-08 LAB — BASIC METABOLIC PANEL
BUN: 9 mg/dL (ref 6–23)
CO2: 29 mEq/L (ref 19–32)
CREATININE: 0.68 mg/dL (ref 0.50–1.10)
Calcium: 9.7 mg/dL (ref 8.4–10.5)
Chloride: 102 mEq/L (ref 96–112)
Glucose, Bld: 89 mg/dL (ref 70–99)
POTASSIUM: 3.9 meq/L (ref 3.5–5.3)
Sodium: 139 mEq/L (ref 135–145)

## 2013-06-08 LAB — POCT GLYCOSYLATED HEMOGLOBIN (HGB A1C): Hemoglobin A1C: 6.7

## 2013-06-08 LAB — HIV ANTIBODY (ROUTINE TESTING W REFLEX): HIV: NONREACTIVE

## 2013-06-08 MED ORDER — BENZONATATE 100 MG PO CAPS: 100.0000 mg | ORAL_CAPSULE | Freq: Two times a day (BID) | ORAL | Status: DC | PRN

## 2013-06-08 MED ORDER — ATORVASTATIN CALCIUM 40 MG PO TABS: 40.0000 mg | ORAL_TABLET | Freq: Every day | ORAL | Status: DC

## 2013-06-08 MED ORDER — VITAMIN D 1000 UNITS PO TABS: 1000.0000 [IU] | ORAL_TABLET | Freq: Every day | ORAL | Status: DC

## 2013-06-08 MED ORDER — HYDROCHLOROTHIAZIDE 25 MG PO TABS: 25.0000 mg | ORAL_TABLET | Freq: Every day | ORAL | Status: DC

## 2013-06-08 MED ORDER — RAMIPRIL 10 MG PO CAPS: 10.0000 mg | ORAL_CAPSULE | Freq: Every day | ORAL | Status: DC

## 2013-06-08 NOTE — Assessment & Plan Note (Signed)
Improving. Discussed lingering cough. Tessalon pearls provided to use as needed.

## 2013-06-08 NOTE — Progress Notes (Signed)
   Subjective:    Patient ID: Gaetano Hawthorne, female    DOB: [birth year], 68 y.o.   MRN: [id number]  HPI Chontel Warning is here for f/u dm/htn and cough.  Diabetes Compliant with medications: yes Side effects from medications: no Check sugars at home: no  Polyuria: no Polydipsia: no Vision changes: no Hypoglycemic symptoms: no  Hypertension Compliant with medication: yes Side effects from medication: no Check BP at home: no  Chest pain: no Palpitations: no Vision changes: no Leg edema: no Dizziness: no  Cough Reports cold symptoms for last week.  Now improving but continues to cough.  No fevers or shortness of breath.  Current Outpatient Prescriptions on File Prior to Visit  Medication Sig Dispense Refill  . aspirin 81 MG tablet Take 1 tablet (81 mg total) by mouth daily.  90 tablet  3  . bimatoprost (LUMIGAN) 0.01 % SOLN Place 1 drop into both eyes at bedtime.      . brimonidine-timolol (COMBIGAN) 0.2-0.5 % ophthalmic solution Place 1 drop into both eyes every 12 (twelve) hours. 1 drop each eye every 12 hours  30 mL  3  . calcium carbonate (OS-CAL) 600 MG TABS Take 1 tablet (600 mg total) by mouth daily.  90 tablet  3  . dorzolamide (TRUSOPT) 2 % ophthalmic solution Place 1 drop into the right eye 2 (two) times daily.      Marland Kitchen gabapentin (NEURONTIN) 400 MG capsule TAKES 2 CAPSULES IN THE MORNING & IN THE EVENING & 3 CAPSULES AT BEDTIME  450 capsule  3  . HYDROcodone-acetaminophen (NORCO/VICODIN) 5-325 MG per tablet Take 1 tablet by mouth every 6 (six) hours as needed for pain.      . metFORMIN (GLUCOPHAGE) 500 MG tablet Take 1 tablet (500 mg total) by mouth 2 (two) times daily with a meal.  180 tablet  3  . Multiple Vitamins-Minerals (CENTRUM SILVER ADULT 50+ PO) Take 1 tablet by mouth.      Marland Kitchen omeprazole (PRILOSEC) 20 MG capsule Take 1 capsule (20 mg total) by mouth 2 (two) times daily.  180 capsule  3   Current Facility-Administered Medications on File Prior to Visit    Medication Dose Route Frequency Provider Last Rate Last Dose  . pneumococcal 23 valent vaccine (PNU-IMMUNE) injection 0.5 mL  0.5 mL Intramuscular Once Lorenza [place]        I have reviewed and updated the following as appropriate: allergies and current medications SHx: non smoker  Health Maintenance: will get hep C and hiv screening today  Review of Systems See HPI    Objective:   Physical Exam BP 132/82  Pulse 71  Temp(Src) 97.5 F (36.4 C) (Oral)  Ht 5\' 2"  (1.575 m)  Wt 211 lb (95.709 kg)  BMI 38.58 kg/m2 Gen: alert, cooperative, NAD, coughing HEENT: AT/, sclera white, MMM Neck: supple, no bruits CV: RRR, no murmurs Pulm: CTAB, no wheezes or rales Ext: no edema, 2+ DP pulses bilaterally     Assessment & Plan:

## 2013-06-08 NOTE — Patient Instructions (Addendum)
It was nice to see you!  We are checking some blood work today.  I will call you if anything is wrong, otherwise you will get a letter with the results in the mail in 1-2 weeks.  You can take Tessalon pearls 1 pill twice a day as needed for the cough.  I put in the referral to Dr. Ruel Favors office.  Please give [place] a call if you haven't heard anything in the next 2 weeks.  I will see you back in 6 months.

## 2013-06-08 NOTE — Assessment & Plan Note (Signed)
Previously well controlled. A1c pending today. Continue metformin. F/u 6 months.

## 2013-06-08 NOTE — Assessment & Plan Note (Signed)
Well controlled. Continue HCTZ, ramipril. F/u 6 months.

## 2013-09-05 ENCOUNTER — Other Ambulatory Visit: Payer: Self-pay | Admitting: Emergency Medicine

## 2013-09-15 ENCOUNTER — Telehealth: Payer: Self-pay | Admitting: Emergency Medicine

## 2013-09-29 NOTE — Telephone Encounter (Signed)
Left Message - Called to schedule SAWV. When pt returns call please schedule appt. for 30 minutes.

## 2013-10-14 ENCOUNTER — Other Ambulatory Visit: Payer: Self-pay | Admitting: *Deleted

## 2013-10-14 MED ORDER — VITAMIN D 1000 UNITS PO TABS: 1000.0000 [IU] | ORAL_TABLET | Freq: Every day | ORAL | Status: AC

## 2013-10-19 ENCOUNTER — Ambulatory Visit (INDEPENDENT_AMBULATORY_CARE_PROVIDER_SITE_OTHER): Payer: PRIVATE HEALTH INSURANCE | Admitting: Home Health Services

## 2013-10-19 ENCOUNTER — Encounter: Payer: Self-pay | Admitting: Home Health Services

## 2013-10-19 VITALS — BP 109/71 | HR 70 | Temp 97.5°F | Wt 205.0 lb

## 2013-10-19 DIAGNOSIS — Z23 Encounter for immunization: Secondary | ICD-10-CM

## 2013-10-19 DIAGNOSIS — Z0000 Encounter for general adult medical examination without abnormal findings: Secondary | ICD-10-CM

## 2013-10-19 NOTE — Progress Notes (Signed)
Patient here for annual wellness visit, patient reports: Risk Factors/Conditions needing evaluation or treatment: Pt. Does  have any new risk factors that need evaluation.  Home Safety: Pt lives at home, by self in a 1 story home.  Pt reports having smoke alarms. Other Information: Corrective lens: Pt wears daily corrective lens, has annual eye exams. Dentures: Pt does not have dentures, has annual dental exams. Memory: Pt denies memory problems.  Patient's Mini Mental Score (recorded in doc. flowsheet): 30 Bladder:  Pt denies problems with bladder control.  BMI/Exercise: We discussed BMI and strategies for weight loss including increasing fruits and vegetables.  We also discussed  starting a regular exercise routine.  Med Adherence:  We discussed importance of taking medications for htn, cholesterol and dm.  Pt reports missing 0 day in the past week.  ADL/IADL:  Pt reports independence in all functions. Uses [employer] for transportation Balance/Gait: Pt reports 0 falls in the past year.  We discussed home safety and fall prevention.       Annual Wellness Visit Requirements Recorded Today In  Medical, family, social history Past Medical, Family, Social History Section  Current providers Care team  Current medications Medications  Wt, BP, Ht, BMI Vital signs  Tobacco, alcohol, illicit drug use History  ADL Nurse Assessment  Depression Screening Nurse Assessment  Cognitive impairment Nurse Assessment  Mini Mental Status Document Flowsheet  Fall Risk Fall/Depression  Home Safety Progress Note  End of Life Planning (welcome visit) Social Documentation  Medicare preventative services Progress Note  Risk factors/conditions needing evaluation/treatment Progress Note  Personalized health advice Patient Instructions, goals, letter  Diet & Exercise Social Documentation  Emergency Contact Social Documentation  Seat Belts Social Documentation  Sun exposure/protection Social Documentation       Addendum:  I have reviewed this visit and discussed with Lamont Dowdy and agree with her documentation.   Lavon Paganini, MD PGY-1,  [facility] 11/16/2013  5:02 PM

## 2013-11-16 ENCOUNTER — Encounter: Payer: Self-pay | Admitting: Home Health Services

## 2013-12-07 ENCOUNTER — Other Ambulatory Visit: Payer: Self-pay | Admitting: *Deleted

## 2013-12-08 ENCOUNTER — Other Ambulatory Visit: Payer: Self-pay | Admitting: *Deleted

## 2013-12-09 ENCOUNTER — Other Ambulatory Visit: Payer: Self-pay | Admitting: *Deleted

## 2013-12-09 ENCOUNTER — Telehealth: Payer: Self-pay | Admitting: Family Medicine

## 2013-12-09 MED ORDER — OMEPRAZOLE 20 MG PO CPDR: 20.0000 mg | DELAYED_RELEASE_CAPSULE | Freq: Two times a day (BID) | ORAL | Status: DC

## 2013-12-09 MED ORDER — METFORMIN HCL 500 MG PO TABS: 500.0000 mg | ORAL_TABLET | Freq: Two times a day (BID) | ORAL | Status: DC

## 2013-12-09 NOTE — Telephone Encounter (Signed)
Pt called and needs a refill on her Metformin called in. jw

## 2013-12-09 NOTE — Telephone Encounter (Signed)
Refill sent to pharmacy.   

## 2013-12-09 NOTE — Telephone Encounter (Signed)
Please let [name] know that her Rx has been refilled for 1 month. Additionally, please have her make a diabetes 6 month follow up soon (she was last seen in March by an MD), that way she can meet her new PCP, Dr. Brita Romp as well.

## 2013-12-09 NOTE — Telephone Encounter (Signed)
Forward to PCP for refill.[name], [name]

## 2013-12-10 NOTE — Telephone Encounter (Signed)
Pt informed of refill and to make a 40-month diabetes appt. [place], [place] CMA

## 2013-12-23 ENCOUNTER — Ambulatory Visit (INDEPENDENT_AMBULATORY_CARE_PROVIDER_SITE_OTHER): Payer: PRIVATE HEALTH INSURANCE | Admitting: Family Medicine

## 2013-12-23 ENCOUNTER — Encounter: Payer: Self-pay | Admitting: Family Medicine

## 2013-12-23 VITALS — BP 115/80 | HR 73 | Temp 98.2°F | Wt 205.0 lb

## 2013-12-23 DIAGNOSIS — Z23 Encounter for immunization: Secondary | ICD-10-CM

## 2013-12-23 DIAGNOSIS — E785 Hyperlipidemia, unspecified: Secondary | ICD-10-CM

## 2013-12-23 DIAGNOSIS — E1142 Type 2 diabetes mellitus with diabetic polyneuropathy: Secondary | ICD-10-CM

## 2013-12-23 DIAGNOSIS — I10 Essential (primary) hypertension: Secondary | ICD-10-CM

## 2013-12-23 DIAGNOSIS — E1149 Type 2 diabetes mellitus with other diabetic neurological complication: Secondary | ICD-10-CM

## 2013-12-23 LAB — BASIC METABOLIC PANEL
BUN: 9 mg/dL (ref 6–23)
CALCIUM: 9.3 mg/dL (ref 8.4–10.5)
CO2: 26 mEq/L (ref 19–32)
Chloride: 104 mEq/L (ref 96–112)
Creat: 0.71 mg/dL (ref 0.50–1.10)
Glucose, Bld: 123 mg/dL — ABNORMAL HIGH (ref 70–99)
POTASSIUM: 3.9 meq/L (ref 3.5–5.3)
SODIUM: 140 meq/L (ref 135–145)

## 2013-12-23 LAB — LIPID PANEL
CHOL/HDL RATIO: 2.2 ratio
Cholesterol: 116 mg/dL (ref 0–200)
HDL: 52 mg/dL (ref >39)
LDL Cholesterol: 49 mg/dL (ref 0–99)
Triglycerides: 73 mg/dL (ref <150)
VLDL: 15 mg/dL (ref 0–40)

## 2013-12-23 LAB — POCT GLYCOSYLATED HEMOGLOBIN (HGB A1C): Hemoglobin A1C: 6.5

## 2013-12-23 MED ORDER — HYDROCHLOROTHIAZIDE 25 MG PO TABS: 25.0000 mg | ORAL_TABLET | Freq: Every day | ORAL | Status: DC

## 2013-12-23 MED ORDER — METFORMIN HCL 500 MG PO TABS: 500.0000 mg | ORAL_TABLET | Freq: Two times a day (BID) | ORAL | Status: DC

## 2013-12-23 MED ORDER — RAMIPRIL 10 MG PO CAPS: 10.0000 mg | ORAL_CAPSULE | Freq: Every day | ORAL | Status: DC

## 2013-12-23 MED ORDER — GABAPENTIN 400 MG PO CAPS: ORAL_CAPSULE | ORAL | Status: DC

## 2013-12-23 NOTE — Patient Instructions (Signed)
It was nice to meet you today.  Continue on your current medications for diabetes, high blood pressure and high cholesterol.  I am increasing your gabapentin dose to 2 pills in the morning, 3 pills in the evening and 3 pills at bedtime.    We are getting some labs today.  I will call you or send you a letter with the results soon.  Come back to see me in 6 months for another check up.  Best, Dr. Brita Romp (Dr. B)  Diabetic Neuropathy Diabetic neuropathy is a nerve disease or nerve damage that is caused by diabetes mellitus. About half of all people with diabetes mellitus have some form of nerve damage. Nerve damage is more common in those who have had diabetes mellitus for many years and who generally have not had good control of their blood sugar (glucose) level. Diabetic neuropathy is a common complication of diabetes mellitus. There are three more common types of diabetic neuropathy and a fourth type that is less common and less understood:   Peripheral neuropathy--This is the most common type of diabetic neuropathy. It causes damage to the nerves of the feet and legs first and then eventually the hands and arms.The damage affects the ability to sense touch.  Autonomic neuropathy--This type causes damage to the autonomic nervous system, which controls the following functions:  Heartbeat.  Body temperature.  Blood pressure.  Urination.  Digestion.  Sweating.  Sexual function.  Focal neuropathy--Focal neuropathy can be painful and unpredictable and occurs most often in older adults with diabetes mellitus. It involves a specific nerve or one area and often comes on suddenly. It usually does not cause long-term problems.  Radiculoplexus neuropathy-- Sometimes called lumbosacral radiculoplexus neuropathy, radiculoplexus neuropathy affects the nerves of the thighs, hips, buttocks, or legs. It is more common in people with type 2 diabetes mellitus and in older men. It is characterized  by debilitating pain, weakness, and atrophy, usually in the thigh muscles. CAUSES  The cause of peripheral, autonomic, and focal neuropathies is diabetes mellitus that is uncontrolled and high glucose levels. The cause of radiculoplexus neuropathy is unknown. However, it is thought to be caused by inflammation related to uncontrolled glucose levels. SIGNS AND SYMPTOMS  Peripheral Neuropathy Peripheral neuropathy develops slowly over time. When the nerves of the feet and legs no longer work there may be:   Burning, stabbing, or aching pain in the legs or feet.  Inability to feel pressure or pain in your feet. This can lead to:  Thick calluses over pressure areas.  Pressure sores.  Ulcers.  Foot deformities.  Reduced ability to feel temperature changes.  Muscle weakness. Autonomic Neuropathy The symptoms of autonomic neuropathy vary depending on which nerves are affected. Symptoms may include:  Problems with digestion, such as:  Feeling sick to your stomach (nausea).  Vomiting.  Bloating.  Constipation.  Diarrhea.  Abdominal pain.  Difficulty with urination. This occurs if you lose your ability to sense when your bladder is full. Problems include:  Urine leakage (incontinence).  Inability to empty your bladder completely (retention).  Rapid or irregular heartbeat (palpitations).  Blood pressure drops when you stand up (orthostatic hypotension). When you stand up you may feel:  Dizzy.  Weak.  Faint.  In men, inability to attain and maintain an erection.  In women, vaginal dryness and problems with decreased sexual desire and arousal.  Problems with body temperature regulation.  Increased or decreased sweating. Focal Neuropathy  Abnormal eye movements or abnormal alignment of both  eyes.  Weakness in the wrist.  Foot drop. This results in an inability to lift the foot properly and abnormal walking or foot movement.  Paralysis on one side of your face  (Bell palsy).  Chest or abdominal pain. Radiculoplexus Neuropathy  Sudden, severe pain in your hip, thigh, or buttocks.  Weakness and wasting of thigh muscles.  Difficulty rising from a seated position.  Abdominal swelling.  Unexplained weight loss (usually more than 10 lb [4.5 kg]). DIAGNOSIS  Peripheral Neuropathy Your senses may be tested. Sensory function testing can be done with:  A light touch using a monofilament.  A vibration with tuning fork.  A sharp sensation with a pin prick. Other tests that can help diagnose neuropathy are:  Nerve conduction velocity. This test checks the transmission of an electrical current through a nerve.  Electromyography. This shows how muscles respond to electrical signals transmitted by nearby nerves.  Quantitative sensory testing. This is used to assess how your nerves respond to vibrations and changes in temperature. Autonomic Neuropathy Diagnosis is often based on reported symptoms. Tell your health care provider if you experience:   Dizziness.   Constipation.   Diarrhea.   Inappropriate urination or inability to urinate.   Inability to get or maintain an erection.  Tests that may be done include:   Electrocardiography or Holter monitor. These are tests that can help show problems with the heart rate or heart rhythm.   An X-ray exam may be done. Focal Neuropathy Diagnosis is made based on your symptoms and what your health care provider finds during your exam. Other tests may be done. They may include:  Nerve conduction velocities. This checks the transmission of electrical current through a nerve.  Electromyography. This shows how muscles respond to electrical signals transmitted by nearby nerves.  Quantitative sensory testing. This test is used to assess how your nerves respond to vibration and changes in temperature. Radiculoplexus Neuropathy  Often the first thing is to eliminate any other issue or problems  that might be the cause, as there is no stick test for diagnosis.  X-ray exam of your spine and lumbar region.  Spinal tap to rule out cancer.  MRI to rule out other lesions. TREATMENT  Once nerve damage occurs, it cannot be reversed. The goal of treatment is to keep the disease or nerve damage from getting worse and affecting more nerve fibers. Controlling your blood glucose level is the key. Most people with radiculoplexus neuropathy see at least a partial improvement over time. You will need to keep your blood glucose and HbA1c levels in the target range determined by your health care provider. Things that help control blood glucose levels include:   Blood glucose monitoring.   Meal planning.   Physical activity.   Diabetes medicine.  Over time, maintaining lower blood glucose levels helps lessen symptoms. Sometimes, prescription pain medicine is needed. HOME CARE INSTRUCTIONS:  Do not smoke.  Keep your blood glucose level in the range that you and your health care provider have determined acceptable for you.  Keep your blood pressure level in the range that you and your health care provider have determined acceptable for you.  Eat a well-balanced diet.  Be active every day.  Check your feet every day. SEEK MEDICAL CARE IF:   You have burning, stabbing, or aching pain in the legs or feet.  You are unable to feel pressure or pain in your feet.  You develop problems with digestion such as:  Nausea.  Vomiting.  Bloating.  Constipation.  Diarrhea.  Abdominal pain.  You have difficulty with urination, such as:  Incontinence.  Retention.  You have palpitations.  You develop orthostatic hypotension. When you stand up you may feel:  Dizzy.  Weak.  Faint.  You cannot attain and maintain an erection (in men).  You have vaginal dryness and problems with decreased sexual desire and arousal (in women).  You have severe pain in your thighs, legs, or  buttocks.  You have unexplained weight loss. Document Released: 06/03/2001 Document Revised: 01/13/2013 Document Reviewed: 09/03/2012 [facility] Patient Information 2015 [place], [place]. This information is not intended to replace advice given to you by your health care provider. Make sure you discuss any questions you have with your health care provider.

## 2013-12-23 NOTE — Assessment & Plan Note (Signed)
BP well controlled. Continue Ramipril 10 mg and HCTZ 25 mg daily.  BMET today. F/u in 6 months.

## 2013-12-23 NOTE — Assessment & Plan Note (Addendum)
Lipid panel today. Continue atorvastatin 40 mg daily for now. Can uptitrate dose if warranted given results of lipid panel. F/u in 6 months.

## 2013-12-23 NOTE — Progress Notes (Signed)
Subjective:   [name] is a 68 y.o. female with a history of T2 DM with peripheral neuropathy, HTN, HLD, obesity here for six-month followup of chronic issues.  1. T2DM with peripheral neuropathy: Taking metformin 500 mg twice a day. No medication side effects. Sees an ophthalmologist and had a retinal scan in the last year. Also sees a podiatrist but has not been within the last year. Reports "weird feeling that is hard to describe" in feet that can occur at any time that has worsened.  Currently takes Gabapentin 800mg  in morning and evening and 1200mg  at bedtime.    2. HTN: Taking ramipril 10 mg daily and HCTZ 25 mg daily.  No CP, SOB, HAs, lightheadedness.  3. HLD: Taking atorvastatin 40mg  daily.  No myalgias, jaundice.  Review of Systems:  Per HPI. All other systems reviewed and are negative.    Past Medical History: Patient Active Problem List   Diagnosis Date Noted  . 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 Viral URI with cough 06/08/2013  . Flank pain 06/19/2012  . Obesity (BMI 30-39.9) 03/24/2012  . Biceps tendon tear 08/10/2010  . Shoulder pain 08/10/2010  . HTN (hypertension) 08/03/2010  . Well controlled type 2 diabetes mellitus with peripheral neuropathy 08/03/2010  . Hyperlipidemia 08/03/2010  . Glaucoma 08/03/2010  . Radicular low back pain 08/03/2010    Medications: reviewed and updated Current Outpatient Prescriptions  Medication Sig Dispense Refill  . celecoxib (CELEBREX) 200 MG capsule Take 200 mg by mouth daily.      Marland Kitchen aspirin 81 MG tablet Take 1 tablet (81 mg total) by mouth daily.  90 tablet  3  . atorvastatin (LIPITOR) 40 MG tablet Take 1 tablet (40 mg total) by mouth daily.  90 tablet  3  . benzonatate (TESSALON) 100 MG capsule Take 1 capsule (100 mg total) by mouth 2 (two) times daily as needed for cough.  20 capsule  0  . bimatoprost (LUMIGAN) 0.01 % SOLN Place 1 drop into both eyes at bedtime.      . brimonidine-timolol (COMBIGAN)  0.2-0.5 % ophthalmic solution Place 1 drop into both eyes every 12 (twelve) hours. 1 drop each eye every 12 hours  30 mL  3  . calcium carbonate (OS-CAL) 600 MG TABS Take 1 tablet (600 mg total) by mouth daily.  90 tablet  3  . cholecalciferol (VITAMIN D) 1000 UNITS tablet Take 1 tablet (1,000 Units total) by mouth daily.  90 tablet  3  . dorzolamide (TRUSOPT) 2 % ophthalmic solution Place 1 drop into the right eye 2 (two) times daily.      Marland Kitchen gabapentin (NEURONTIN) 400 MG capsule Take 2 capsules in the morning & 3 capsules in the evening & 3 capsules at bedtime.  720 capsule  3  . hydrochlorothiazide (HYDRODIURIL) 25 MG tablet Take 1 tablet (25 mg total) by mouth daily.  90 tablet  3  . HYDROcodone-acetaminophen (NORCO/VICODIN) 5-325 MG per tablet Take 1 tablet by mouth every 6 (six) hours as needed for pain.      . metFORMIN (GLUCOPHAGE) 500 MG tablet Take 1 tablet (500 mg total) by mouth 2 (two) times daily with a meal.  180 tablet  3  . Multiple Vitamins-Minerals (CENTRUM SILVER ADULT 50+ PO) Take 1 tablet by mouth.      Marland Kitchen omeprazole (PRILOSEC) 20 MG capsule Take 1 capsule (20 mg total) by mouth 2 (two) times daily.  180 capsule  3  . ramipril (ALTACE) 10  MG capsule Take 1 capsule (10 mg total) by mouth daily.  90 capsule  3   Current Facility-Administered Medications  Medication Dose Route Frequency Provider Last Rate Last Dose  . pneumococcal 23 valent vaccine (PNU-IMMUNE) injection 0.5 mL  0.5 mL Intramuscular Once Lorenza Cambridge        Social History: Never smoker  Objective:  BP 115/80  Pulse 73  Temp(Src) 98.2 F (36.8 C) (Oral)  Wt 205 lb (92.987 kg)  Gen:  68 y.o. female sitting in NAD HEENT: NCAT, MMM, EOMI, PERRL, anicteric sclerae CV: RRR, no MRG, 2+ DP pulses bilaterally Resp: Non-labored, CTAB, no wheezes noted Abd: Soft, NTND, BS present, no guarding or organomegaly Ext: WWP, no edema, no ulcers on feet, sensation grossly intact in feet. Neuro: Alert and  oriented, speech normal      Chemistry      Component Value Date/Time   NA 139 06/08/2013 1048   NA 139 04/26/2010   K 3.9 06/08/2013 1048   CL 102 06/08/2013 1048   CO2 29 06/08/2013 1048   BUN 9 06/08/2013 1048   BUN 9 04/26/2010   CREATININE 0.68 06/08/2013 1048   CREATININE 0.74 04/26/2010 2000   CREATININE 0.7 04/26/2010   GLU 97 04/26/2010      Component Value Date/Time   CALCIUM 9.7 06/08/2013 1048   ALKPHOS 72 02/27/2011 0942   AST 12 02/27/2011 0942   ALT 9 02/27/2011 0942   BILITOT 0.5 02/27/2011 0942      Lab Results  Component Value Date   WBC 5.1 02/27/2011   HGB 13.0 02/27/2011   HCT 41.6 02/27/2011   MCV 81.7 02/27/2011   PLT 263 02/27/2011   Lab Results  Component Value Date   TSH 0.755 03/24/2012   Lab Results  Component Value Date   HGBA1C 6.5 12/23/2013   Assessment:     [name] is a 67 y.o. female here for followup of T2 DM, HTN, HLD.    Plan:     See problem list for problem-specific plans.  Flu vaccine given today.   Lavon Paganini, MD PGY-1,  [facility] 12/23/2013  10:48 AM

## 2013-12-23 NOTE — Assessment & Plan Note (Signed)
A1c 6.5 today. Continue metformin 500 mg BID. Patient to make appointment for a foot exam with podiatrist.  Up-to-date on retinal exam.  Increase gabapentin dose to 800 mg in the morning, 1200 mg in the evening, 1200 mg at bedtime, as patient is likely describing more neuropathic irritation in feet.  F/u in 6 months or sooner if increased gabapentin dose does not help.

## 2013-12-24 ENCOUNTER — Encounter: Payer: Self-pay | Admitting: Family Medicine

## 2014-01-13 ENCOUNTER — Other Ambulatory Visit: Payer: Self-pay | Admitting: *Deleted

## 2014-01-13 DIAGNOSIS — E1142 Type 2 diabetes mellitus with diabetic polyneuropathy: Secondary | ICD-10-CM

## 2014-01-13 MED ORDER — METFORMIN HCL 500 MG PO TABS: 500.0000 mg | ORAL_TABLET | Freq: Two times a day (BID) | ORAL | Status: DC

## 2014-01-24 DIAGNOSIS — M5126 Other intervertebral disc displacement, lumbar region: Secondary | ICD-10-CM | POA: Insufficient documentation

## 2014-04-18 DIAGNOSIS — M9983 Other biomechanical lesions of lumbar region: Secondary | ICD-10-CM | POA: Diagnosis not present

## 2014-04-18 DIAGNOSIS — Z6837 Body mass index (BMI) 37.0-37.9, adult: Secondary | ICD-10-CM | POA: Diagnosis not present

## 2014-04-18 DIAGNOSIS — M545 Low back pain: Secondary | ICD-10-CM | POA: Diagnosis not present

## 2014-04-18 DIAGNOSIS — M5126 Other intervertebral disc displacement, lumbar region: Secondary | ICD-10-CM | POA: Diagnosis not present

## 2014-04-18 DIAGNOSIS — M5416 Radiculopathy, lumbar region: Secondary | ICD-10-CM | POA: Diagnosis not present

## 2014-05-12 DIAGNOSIS — M9983 Other biomechanical lesions of lumbar region: Secondary | ICD-10-CM | POA: Diagnosis not present

## 2014-05-12 DIAGNOSIS — M545 Low back pain: Secondary | ICD-10-CM | POA: Diagnosis not present

## 2014-05-12 DIAGNOSIS — M5416 Radiculopathy, lumbar region: Secondary | ICD-10-CM | POA: Diagnosis not present

## 2014-05-31 ENCOUNTER — Telehealth: Payer: Self-pay | Admitting: Family Medicine

## 2014-05-31 ENCOUNTER — Ambulatory Visit (INDEPENDENT_AMBULATORY_CARE_PROVIDER_SITE_OTHER): Payer: Medicare Other | Admitting: Family Medicine

## 2014-05-31 ENCOUNTER — Encounter: Payer: Self-pay | Admitting: Family Medicine

## 2014-05-31 VITALS — BP 131/79 | HR 80 | Temp 98.3°F | Ht 62.0 in | Wt 207.3 lb

## 2014-05-31 DIAGNOSIS — E1140 Type 2 diabetes mellitus with diabetic neuropathy, unspecified: Secondary | ICD-10-CM | POA: Diagnosis not present

## 2014-05-31 DIAGNOSIS — E119 Type 2 diabetes mellitus without complications: Secondary | ICD-10-CM | POA: Insufficient documentation

## 2014-05-31 DIAGNOSIS — E2839 Other primary ovarian failure: Secondary | ICD-10-CM

## 2014-05-31 DIAGNOSIS — I10 Essential (primary) hypertension: Secondary | ICD-10-CM

## 2014-05-31 DIAGNOSIS — Z0000 Encounter for general adult medical examination without abnormal findings: Secondary | ICD-10-CM | POA: Diagnosis not present

## 2014-05-31 DIAGNOSIS — E785 Hyperlipidemia, unspecified: Secondary | ICD-10-CM

## 2014-05-31 DIAGNOSIS — E1142 Type 2 diabetes mellitus with diabetic polyneuropathy: Secondary | ICD-10-CM

## 2014-05-31 LAB — POCT GLYCOSYLATED HEMOGLOBIN (HGB A1C): HEMOGLOBIN A1C: 6.7

## 2014-05-31 MED ORDER — ATORVASTATIN CALCIUM 40 MG PO TABS: 40.0000 mg | ORAL_TABLET | Freq: Every day | ORAL | Status: DC

## 2014-05-31 NOTE — Assessment & Plan Note (Signed)
Atorvastatin refilled.  

## 2014-05-31 NOTE — Patient Instructions (Signed)
It was nice to see you again today.  Blood pressure is doing well and you can keep taking ramipril.  I will call you about your A1c when it is back. Try to start exercising more as this will help your diabetic control. Someone will call you about scheduling the bone density scan. It will be at the [facility] review your mammogram done.  I will see back in about 3 months to follow-up on your blood pressure and your diabetes.  Take care, Dr. B   Diabetes and Exercise Exercising regularly is important. It is not just about losing weight. It has many health benefits, such as:  Improving your overall fitness, flexibility, and endurance.  Increasing your bone density.  Helping with weight control.  Decreasing your body fat.  Increasing your muscle strength.  Reducing stress and tension.  Improving your overall health. People with diabetes who exercise gain additional benefits because exercise:  Reduces appetite.  Improves the body's use of blood sugar (glucose).  Helps lower or control blood glucose.  Decreases blood pressure.  Helps control blood lipids (such as cholesterol and triglycerides).  Improves the body's use of the hormone insulin by:  Increasing the body's insulin sensitivity.  Reducing the body's insulin needs.  Decreases the risk for heart disease because exercising:  Lowers cholesterol and triglycerides levels.  Increases the levels of good cholesterol (such as high-density lipoproteins [HDL]) in the body.  Lowers blood glucose levels. YOUR ACTIVITY PLAN  Choose an activity that you enjoy and set realistic goals. Your health care provider or diabetes educator can help you make an activity plan that works for you. Exercise regularly as directed by your health care provider. This includes:  Performing resistance training twice a week such as push-ups, sit-ups, lifting weights, or using resistance bands.  Performing 150 minutes of cardio exercises each  week such as walking, running, or playing sports.  Staying active and spending no more than 90 minutes at one time being inactive. Even short bursts of exercise are good for you. Three 10-minute sessions spread throughout the day are just as beneficial as a single 30-minute session. Some exercise ideas include:  Taking the dog for a walk.  Taking the stairs instead of the elevator.  Dancing to your favorite song.  Doing an exercise video.  Doing your favorite exercise with a friend. RECOMMENDATIONS FOR EXERCISING WITH TYPE 1 OR TYPE 2 DIABETES   Check your blood glucose before exercising. If blood glucose levels are greater than 240 mg/dL, check for urine ketones. Do not exercise if ketones are present.  Avoid injecting insulin into areas of the body that are going to be exercised. For example, avoid injecting insulin into:  The arms when playing tennis.  The legs when jogging.  Keep a record of:  Food intake before and after you exercise.  Expected peak times of insulin action.  Blood glucose levels before and after you exercise.  The type and amount of exercise you have done.  Review your records with your health care provider. Your health care provider will help you to develop guidelines for adjusting food intake and insulin amounts before and after exercising.  If you take insulin or oral hypoglycemic agents, watch for signs and symptoms of hypoglycemia. They include:  Dizziness.  Shaking.  Sweating.  Chills.  Confusion.  Drink plenty of water while you exercise to prevent dehydration or heat stroke. Body water is lost during exercise and must be replaced.  Talk to your health  care provider before starting an exercise program to make sure it is safe for you. Remember, almost any type of activity is better than none. Document Released: 06/15/2003 Document Revised: 08/09/2013 Document Reviewed: 09/01/2012 [facility] Patient Information 2015 [place], [place]. This  information is not intended to replace advice given to you by your health care provider. Make sure you discuss any questions you have with your health care provider.

## 2014-05-31 NOTE — Assessment & Plan Note (Signed)
Well-controlled. Continue ramipril 10 mg daily Follow-up in 3-6 months

## 2014-05-31 NOTE — Assessment & Plan Note (Signed)
Up-to-date other than has never had a DEXA scan DEXA scan ordered today

## 2014-05-31 NOTE — Progress Notes (Signed)
Reviewed and agree with assessment and plan.

## 2014-05-31 NOTE — Telephone Encounter (Signed)
A1c 6.7.  Informed patient that still at T2DM goal of <7.  Continue Metformin 1000mg  BID.  No need to adjust meds.  Lavon Paganini, MD, MPH PGY-1,  [facility] 05/31/2014 4:50 PM

## 2014-05-31 NOTE — Assessment & Plan Note (Signed)
Previously well-controlled Repeat A1c today Continue metformin 1000 mg BID Consider addition of glipizide or trial of lifestyle interventions pending A1c Follow-up in 3 months

## 2014-05-31 NOTE — Progress Notes (Signed)
   Subjective:   [name] is a 69 y.o. female with a history of T2DM with peripheral neuropathy, HTN here for f/u of chronic issues.  HTN: Worried because BP was 130/70 recently. Thinks that ramipril may not be working because she has been on it for so many years.  T2DM: Patient reports that a [facility] RN came out to check on her recently and took an a1c that was 7.3.  This is much higher than patient's A1c has been previously (6.5, 6.7).  She is taking Metformin 1000mg  BID.  Denies eating a lot of carbs, but on probing admits to eating whole grain bread and grits often.  Was [facility] at [facility], but not for last few months.  Review of Systems:  Per HPI. All other systems reviewed and are negative.   PMH, PSH, Medications, Allergies, and FmHx reviewed and updated in EMR.  Social History: never smoker  Objective:  BP 131/79 mmHg  Pulse 80  Temp(Src) 98.3 F (36.8 C) (Oral)  Ht 5\' 2"  (1.575 m)  Wt 207 lb 4.8 oz (94.031 kg)  BMI 37.91 kg/m2  Gen:  69 y.o. female in NAD HEENT: NCAT, MMM, EOMI, PERRL, anicteric sclerae CV: RRR, no MRG Resp: Non-labored, CTAB, no wheezes noted Abd: Soft, NTND, BS present, no guarding or organomegaly Ext: WWP, no edema. Foot exam without callus or ulcers. Sensation intact grossly. Neuro: Alert and oriented, speech normal      Chemistry      Component Value Date/Time   NA 140 12/23/2013 1030   NA 139 04/26/2010   K 3.9 12/23/2013 1030   CL 104 12/23/2013 1030   CO2 26 12/23/2013 1030   BUN 9 12/23/2013 1030   BUN 9 04/26/2010   CREATININE 0.71 12/23/2013 1030   CREATININE 0.74 04/26/2010 2000   CREATININE 0.7 04/26/2010   GLU 97 04/26/2010      Component Value Date/Time   CALCIUM 9.3 12/23/2013 1030   ALKPHOS 72 02/27/2011 0942   AST 12 02/27/2011 0942   ALT 9 02/27/2011 0942   BILITOT 0.5 02/27/2011 0942      Lab Results  Component Value Date   WBC 5.1 02/27/2011   HGB 13.0 02/27/2011   HCT 41.6 02/27/2011   MCV  81.7 02/27/2011   PLT 263 02/27/2011   Lab Results  Component Value Date   TSH 0.755 03/24/2012   Lab Results  Component Value Date   HGBA1C 6.5 12/23/2013   Assessment:     [name] is a 69 y.o. female here for T2DM and HTN f/u.    Plan:     See problem list for problem-specific plans.   Lavon Paganini, MD PGY-1,  [facility] 05/31/2014  2:48 PM

## 2014-06-02 ENCOUNTER — Ambulatory Visit
Admission: RE | Admit: 2014-06-02 | Discharge: 2014-06-02 | Disposition: A | Discharge status: Home / Self Care | Payer: Medicare Other | Source: Ambulatory Visit | Attending: Family Medicine | Admitting: Family Medicine

## 2014-06-02 DIAGNOSIS — E2839 Other primary ovarian failure: Secondary | ICD-10-CM

## 2014-06-02 DIAGNOSIS — Z780 Asymptomatic menopausal state: Secondary | ICD-10-CM | POA: Diagnosis not present

## 2014-06-02 DIAGNOSIS — M85832 Other specified disorders of bone density and structure, left forearm: Secondary | ICD-10-CM | POA: Diagnosis not present

## 2014-06-03 ENCOUNTER — Telehealth: Payer: Self-pay | Admitting: Family Medicine

## 2014-06-03 NOTE — Telephone Encounter (Signed)
Called [name] to update about results of DXA scan.  T scores for Femur 0.8 and for forearm -1.1.  Forearm measure barely in osteopenic range.  Patient already taking daily calcium.  Encouraged her to continue taking.  Patient expressed understanding and had no questions.  Lavon Paganini, MD, MPH PGY-1,  [facility] 06/03/2014 5:54 PM

## 2014-06-13 ENCOUNTER — Encounter: Payer: Self-pay | Admitting: Family Medicine

## 2014-06-13 NOTE — Progress Notes (Signed)
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  Please fax when competed.

## 2014-06-13 NOTE — Progress Notes (Signed)
Placed in MDs box. Deseree Blount, CMA  

## 2014-06-14 NOTE — Progress Notes (Signed)
Left voice message for pt informing her that form was faxed as requested and she can pick up the form.  Derl Barrow, RN

## 2014-06-14 NOTE — Progress Notes (Signed)
Form filled out and given to Lorraine Lax, MD, MPH PGY-1,  [facility] 06/14/2014 9:26 AM

## 2014-06-15 DIAGNOSIS — M5126 Other intervertebral disc displacement, lumbar region: Secondary | ICD-10-CM | POA: Diagnosis not present

## 2014-06-15 DIAGNOSIS — M9983 Other biomechanical lesions of lumbar region: Secondary | ICD-10-CM | POA: Diagnosis not present

## 2014-06-15 DIAGNOSIS — M5416 Radiculopathy, lumbar region: Secondary | ICD-10-CM | POA: Diagnosis not present

## 2014-06-15 DIAGNOSIS — M545 Low back pain: Secondary | ICD-10-CM | POA: Diagnosis not present

## 2014-08-19 DIAGNOSIS — E1140 Type 2 diabetes mellitus with diabetic neuropathy, unspecified: Secondary | ICD-10-CM | POA: Diagnosis not present

## 2014-08-23 DIAGNOSIS — H4011X2 Primary open-angle glaucoma, moderate stage: Secondary | ICD-10-CM | POA: Diagnosis not present

## 2014-09-22 DIAGNOSIS — M5416 Radiculopathy, lumbar region: Secondary | ICD-10-CM | POA: Diagnosis not present

## 2014-09-22 DIAGNOSIS — Z6837 Body mass index (BMI) 37.0-37.9, adult: Secondary | ICD-10-CM | POA: Diagnosis not present

## 2014-09-22 DIAGNOSIS — M545 Low back pain: Secondary | ICD-10-CM | POA: Diagnosis not present

## 2014-09-22 DIAGNOSIS — M9983 Other biomechanical lesions of lumbar region: Secondary | ICD-10-CM | POA: Diagnosis not present

## 2014-09-22 DIAGNOSIS — M5126 Other intervertebral disc displacement, lumbar region: Secondary | ICD-10-CM | POA: Diagnosis not present

## 2014-10-04 DIAGNOSIS — M5416 Radiculopathy, lumbar region: Secondary | ICD-10-CM | POA: Diagnosis not present

## 2014-10-04 DIAGNOSIS — M545 Low back pain: Secondary | ICD-10-CM | POA: Diagnosis not present

## 2014-10-12 ENCOUNTER — Encounter: Payer: Self-pay | Admitting: Gastroenterology

## 2014-10-20 ENCOUNTER — Emergency Department (HOSPITAL_COMMUNITY)
Admission: EM | Admit: 2014-10-20 | Discharge: 2014-10-20 | Disposition: A | Discharge status: Home / Self Care | Payer: Medicaid Other | Attending: Emergency Medicine | Admitting: Emergency Medicine

## 2014-10-20 ENCOUNTER — Encounter (HOSPITAL_COMMUNITY): Payer: Self-pay | Admitting: *Deleted

## 2014-10-20 ENCOUNTER — Emergency Department (HOSPITAL_COMMUNITY): Payer: Medicaid Other

## 2014-10-20 DIAGNOSIS — M1990 Unspecified osteoarthritis, unspecified site: Secondary | ICD-10-CM | POA: Insufficient documentation

## 2014-10-20 DIAGNOSIS — Z7982 Long term (current) use of aspirin: Secondary | ICD-10-CM | POA: Insufficient documentation

## 2014-10-20 DIAGNOSIS — I10 Essential (primary) hypertension: Secondary | ICD-10-CM | POA: Insufficient documentation

## 2014-10-20 DIAGNOSIS — M25559 Pain in unspecified hip: Secondary | ICD-10-CM

## 2014-10-20 DIAGNOSIS — K219 Gastro-esophageal reflux disease without esophagitis: Secondary | ICD-10-CM | POA: Insufficient documentation

## 2014-10-20 DIAGNOSIS — Z791 Long term (current) use of non-steroidal anti-inflammatories (NSAID): Secondary | ICD-10-CM | POA: Insufficient documentation

## 2014-10-20 DIAGNOSIS — E559 Vitamin D deficiency, unspecified: Secondary | ICD-10-CM | POA: Insufficient documentation

## 2014-10-20 DIAGNOSIS — Z8619 Personal history of other infectious and parasitic diseases: Secondary | ICD-10-CM | POA: Diagnosis not present

## 2014-10-20 DIAGNOSIS — Z79899 Other long term (current) drug therapy: Secondary | ICD-10-CM | POA: Diagnosis not present

## 2014-10-20 DIAGNOSIS — M79604 Pain in right leg: Secondary | ICD-10-CM | POA: Diagnosis present

## 2014-10-20 DIAGNOSIS — M79661 Pain in right lower leg: Secondary | ICD-10-CM | POA: Diagnosis not present

## 2014-10-20 DIAGNOSIS — E119 Type 2 diabetes mellitus without complications: Secondary | ICD-10-CM | POA: Diagnosis not present

## 2014-10-20 DIAGNOSIS — M5441 Lumbago with sciatica, right side: Secondary | ICD-10-CM | POA: Diagnosis not present

## 2014-10-20 DIAGNOSIS — G8929 Other chronic pain: Secondary | ICD-10-CM | POA: Diagnosis not present

## 2014-10-20 DIAGNOSIS — M5431 Sciatica, right side: Secondary | ICD-10-CM | POA: Diagnosis not present

## 2014-10-20 DIAGNOSIS — H409 Unspecified glaucoma: Secondary | ICD-10-CM | POA: Diagnosis not present

## 2014-10-20 DIAGNOSIS — E785 Hyperlipidemia, unspecified: Secondary | ICD-10-CM | POA: Insufficient documentation

## 2014-10-20 MED ORDER — OXYCODONE-ACETAMINOPHEN 5-325 MG PO TABS: 1.0000 | ORAL_TABLET | ORAL | Status: DC | PRN

## 2014-10-20 MED ORDER — OXYCODONE-ACETAMINOPHEN 5-325 MG PO TABS
1.0000 | ORAL_TABLET | Freq: Once | ORAL | Status: AC
  Administered 2014-10-20: 1 via ORAL
  Filled 2014-10-20: qty 1

## 2014-10-20 MED ORDER — NAPROXEN 375 MG PO TABS: 375.0000 mg | ORAL_TABLET | Freq: Two times a day (BID) | ORAL | Status: DC

## 2014-10-20 NOTE — ED Notes (Signed)
Pt states R hip pain that radiates to R knee since 6/23 after exercise.  She states hx of same pain several years ago that was improved with steroid shot.  However, she was given a steroid shot on 6/28 with no relief.

## 2014-10-20 NOTE — ED Provider Notes (Signed)
CSN: [id number]     Arrival date & time 10/20/14  1154 History  This chart was scribed for non-physician practitioner, Evalee Jefferson, PA-C, working with Virgel Manifold, MD, by Stephania Fragmin, ED Scribe. This patient was seen in room TR07C/TR07C and the patient's care was started at 1:10 PM.    Chief Complaint  Patient presents with  . Leg Pain    Patient is a 69 y.o. female presenting with leg pain. The history is provided by the patient. No language interpreter was used.  Leg Pain Location:  Leg Pain details:    Radiates to:  Does not radiate   Severity:  Severe   Onset quality:  Gradual   Duration:  1 day (acutely worsened today)   Timing:  Constant   Progression:  Worsening Chronicity:  Chronic Dislocation: no   Foreign body present:  No foreign bodies Tetanus status:  Unknown Relieved by:  Nothing Worsened by:  Bearing weight Ineffective treatments:  NSAIDs and heat Associated symptoms: back pain   Associated symptoms: no fever   Risk factors: no concern for non-accidental trauma     HPI Comments: Eileene Kisling is a 69 y.o. 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  Pain is worsened when ambulating. She states walking and bearing weight exacerbate her pain to a severe degree. Patient states she is followed by pain management and received a steroid injection from Dr. Maryjean Ka on 10/04/14; she is scheduled to receive a second injection next month. She has received steroid injections in the past which alleviated her pain, but her latest injection provided no relief. She has also taken hydrocodone, prescribed by a former PCP, and applied a heating pad with no relief, which she suspects may be due to waning efficacy after taking hydrocodone  for a long period of time. She denies any right hip pain or back pain. Denies injury or falls. PCP: [facility]   Past Medical History  Diagnosis Date  . Diabetes mellitus   . GERD (gastroesophageal reflux disease)   . Hyperlipidemia   . Hypertension   . 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 Arthritis     osteoarthritis  . Back pain, chronic     per pt disc disease, disc herniation- Followed by Dr. Cyndy Freeze  . Carpal tunnel syndrome on both sides     Followed by Dr. Burney Gauze  . Glaucoma   . Trigger finger   . Polio osteopathy of lower leg 1948  . DDD (degenerative disc disease)     Lumbar Spondylosis  . Abnormal Pap smear of cervix 1970    Cryosurgery done  . Vitamin D deficiency     replaced 2010   Past Surgical History  Procedure Laterality Date  . Cholecystectomy    . Hand surgery      Right wrist, s/p plate removal in Feb 2012  . Abdominal hysterectomy      partial  . Joint replacement  2006    left knee   Family History  Problem Relation Age of Onset  . Heart disease Father   . Heart disease Sister   . Hyperlipidemia Sister   . Hypertension Sister   . Kidney  disease Brother   . Heart disease Mother    History  Substance Use Topics  . Smoking status: Never Smoker   . Smokeless tobacco: Never Used  . Alcohol Use: No   OB History    No data available     Review of Systems  Constitutional: Negative for fever.  Musculoskeletal: Positive for back pain and arthralgias. Negative for joint swelling. Myalgias: right posterior leg pain.  Skin: Negative for rash.  Neurological: Negative for weakness and numbness.     Allergies  Review of patient's allergies indicates no known allergies.  Home Medications   Prior to Admission medications   Medication Sig Start Date End Date Taking? Authorizing Provider  aspirin 81 MG tablet Take 1 tablet (81 mg total) by mouth daily. 09/30/11  Yes Deneise Lever, MD   atorvastatin (LIPITOR) 40 MG tablet Take 1 tablet (40 mg total) by mouth daily. 05/31/14  Yes Virginia Crews, MD  bimatoprost (LUMIGAN) 0.01 % SOLN Place 1 drop into both eyes at bedtime.   Yes Historical Provider, MD  brimonidine-timolol (COMBIGAN) 0.2-0.5 % ophthalmic solution Place 1 drop into both eyes every 12 (twelve) hours. 1 drop each eye every 12 hours 09/30/11  Yes Deneise Lever, MD  calcium carbonate (OS-CAL) 600 MG TABS Take 1 tablet (600 mg total) by mouth daily. 09/30/11  Yes Deneise Lever, MD  cholecalciferol (VITAMIN D) 1000 UNITS tablet Take 1 tablet (1,000 Units total) by mouth daily. 10/14/13  Yes Virginia Crews, MD  dorzolamide (TRUSOPT) 2 % ophthalmic solution Place 1 drop into the right eye 2 (two) times daily.   Yes Historical Provider, MD  gabapentin (NEURONTIN) 400 MG capsule Take 2 capsules in the morning & 3 capsules in the evening & 3 capsules at bedtime. 12/23/13  Yes Virginia Crews, MD  hydrochlorothiazide (HYDRODIURIL) 25 MG tablet Take 1 tablet (25 mg total) by mouth daily. 12/23/13  Yes Virginia Crews, MD  HYDROcodone-acetaminophen (NORCO/VICODIN) 5-325 MG per tablet Take 1 tablet by mouth every 6 (six) hours as needed for pain.   Yes Historical Provider, MD  metFORMIN (GLUCOPHAGE) 500 MG tablet Take 1 tablet (500 mg total) by mouth 2 (two) times daily with a meal. 01/13/14  Yes Virginia Crews, MD  Multiple Vitamins-Minerals (CENTRUM SILVER ADULT 50+ PO) Take 1 tablet by mouth.   Yes Historical Provider, MD  ramipril (ALTACE) 10 MG capsule Take 1 capsule (10 mg total) by mouth daily. 12/23/13  Yes Virginia Crews, MD  benzonatate (TESSALON) 100 MG capsule Take 1 capsule (100 mg total) by mouth 2 (two) times daily as needed for cough. 06/08/13   Melony Overly, MD  celecoxib (CELEBREX) 200 MG capsule Take 200 mg by mouth daily. 12/06/13   Historical Provider, MD  naproxen (NAPROSYN) 375 MG tablet Take 1 tablet (375 mg total) by mouth 2 (two) times  daily. 10/20/14   Evalee Jefferson, PA-C  omeprazole (PRILOSEC) 20 MG capsule Take 1 capsule (20 mg total) by mouth 2 (two) times daily. 12/09/13   Virginia Crews, MD  oxyCODONE-acetaminophen (PERCOCET/ROXICET) 5-325 MG per tablet Take 1 tablet by mouth every 4 (four) hours as needed. 10/20/14   Evalee Jefferson, PA-C   BP 121/67 mmHg  Pulse 65  Temp(Src) 97.9 F (36.6 C) (Oral)  Resp 16  SpO2 98% Physical Exam  Constitutional: She appears well-developed and well-nourished.  HENT:  Head: Normocephalic.  Eyes: Conjunctivae are normal.  Neck: Normal range of motion. Neck supple.  Cardiovascular: Normal rate and intact distal pulses.   Pedal pulses normal.  Pulmonary/Chest: Effort normal.  Abdominal: Soft. Bowel sounds are normal. She exhibits no distension and no mass.  Musculoskeletal: Normal range of motion. She exhibits tenderness. She exhibits no edema.       Lumbar back: She exhibits tenderness. She exhibits no swelling, no edema and no spasm.       Right upper leg: She exhibits tenderness.  Chronic atrophy right calf, but full strength bilateral extremities. No foot drop. Pedal pulses are intact. Right thigh is soft and without rash. TTP along her right lateral posterior thigh.  No pain with external or internal rotation of right hip. Also tender at the right paralumbar and right sciatic notch.  Palpation causes radiation of pain into the thigh.  Neurological: She is alert. She has normal strength. She displays no atrophy and no tremor. No sensory deficit. Gait normal.  Reflex Scores:      Patellar reflexes are 2+ on the right side and 2+ on the left side.      Achilles reflexes are 2+ on the right side and 2+ on the left side. No strength deficit noted in hip and knee flexor and extensor muscle groups.  Ankle flexion and extension intact.  Skin: Skin is warm and dry.  Psychiatric: She has a normal mood and affect.  Nursing note and vitals reviewed.   ED Course  Procedures (including  critical care time)  DIAGNOSTIC STUDIES: Oxygen Saturation is 97% on RA, normal by my interpretation.    COORDINATION OF CARE: 1:21 PM - Discussed treatment plan with pt at bedside, and pt agreed to plan.   Imaging Review No results found.  MDM   Final diagnoses:  Sciatica, right    Patients labs and/or radiological studies were reviewed and considered during the medical decision making and disposition process.  Results were also discussed with patient. Pt prescribed naproxen and oxycodone to use in place of her hydrocodone for the short term. Advised f/u with her pcp or Dr. Maryjean Ka for a recheck of her sx,  Cautioned re sedation. She was given a tablet of the oxycodone here with improvement in pain. Advised heat tx, activity as tolerated.  Evalee Jefferson, PA-C 10/22/14 1820  Virgel Manifold, MD 10/22/14 2106

## 2014-10-20 NOTE — ED Notes (Signed)
Patient states she has advised her MD about the Leg and was advised the steroid shots should help. Patient states, " the steroid  only went in my left leg it did not go in my right leg." When this nurse asked the patient if the left gave problems patient denies and states, " I had polio in my right leg that's why its always in pain". Patient states she is able to bear weight but it just hurts to do so.

## 2014-10-20 NOTE — Discharge Instructions (Signed)
Sciatica Sciatica is pain, weakness, numbness, or tingling along the path of the sciatic nerve. The nerve starts in the lower back and runs down the back of each leg. The nerve controls the muscles in the lower leg and in the back of the knee, while also providing sensation to the back of the thigh, lower leg, and the sole of your foot. Sciatica is a symptom of another medical condition. For instance, nerve damage or certain conditions, such as a herniated disk or bone spur on the spine, pinch or put pressure on the sciatic nerve. This causes the pain, weakness, or other sensations normally associated with sciatica. Generally, sciatica only affects one side of the body. CAUSES   Herniated or slipped disc.  Degenerative disk disease.  A pain disorder involving the narrow muscle in the buttocks (piriformis syndrome).  Pelvic injury or fracture.  Pregnancy.  Tumor (rare). SYMPTOMS  Symptoms can vary from mild to very severe. The symptoms usually travel from the low back to the buttocks and down the back of the leg. Symptoms can include:  Mild tingling or dull aches in the lower back, leg, or hip.  Numbness in the back of the calf or sole of the foot.  Burning sensations in the lower back, leg, or hip.  Sharp pains in the lower back, leg, or hip.  Leg weakness.  Severe back pain inhibiting movement. These symptoms may get worse with coughing, sneezing, laughing, or prolonged sitting or standing. Also, being overweight may worsen symptoms. DIAGNOSIS  Your caregiver will perform a physical exam to look for common symptoms of sciatica. He or she may ask you to do certain movements or activities that would trigger sciatic nerve pain. Other tests may be performed to find the cause of the sciatica. These may include:  Blood tests.  X-rays.  Imaging tests, such as an MRI or CT scan. TREATMENT  Treatment is directed at the cause of the sciatic pain. Sometimes, treatment is not necessary  and the pain and discomfort goes away on its own. If treatment is needed, your caregiver may suggest:  Over-the-counter medicines to relieve pain.  Prescription medicines, such as anti-inflammatory medicine, muscle relaxants, or narcotics.  Applying heat or ice to the painful area.  Steroid injections to lessen pain, irritation, and inflammation around the nerve.  Reducing activity during periods of pain.  Exercising and stretching to strengthen your abdomen and improve flexibility of your spine. Your caregiver may suggest losing weight if the extra weight makes the back pain worse.  Physical therapy.  Surgery to eliminate what is pressing or pinching the nerve, such as a bone spur or part of a herniated disk. HOME CARE INSTRUCTIONS   Only take over-the-counter or prescription medicines for pain or discomfort as directed by your caregiver.  Apply ice to the affected area for 20 minutes, 3-4 times a day for the first 48-72 hours. Then try heat in the same way.  Exercise, stretch, or perform your usual activities if these do not aggravate your pain.  Attend physical therapy sessions as directed by your caregiver.  Keep all follow-up appointments as directed by your caregiver.  Do not wear high heels or shoes that do not provide proper support.  Check your mattress to see if it is too soft. A firm mattress may lessen your pain and discomfort. SEEK IMMEDIATE MEDICAL CARE IF:   You lose control of your bowel or bladder (incontinence).  You have increasing weakness in the lower back, pelvis, buttocks,   or legs.  You have redness or swelling of your back.  You have a burning sensation when you urinate.  You have pain that gets worse when you lie down or awakens you at night.  Your pain is worse than you have experienced in the past.  Your pain is lasting longer than 4 weeks.  You are suddenly losing weight without reason. MAKE SURE YOU:  Understand these  instructions.  Will watch your condition.  Will get help right away if you are not doing well or get worse. Document Released: 03/19/2001 Document Revised: 09/24/2011 Document Reviewed: 08/04/2011 [employer] Patient Information 2015 [employer]. This information is not intended to replace advice given to you by your health care provider. Make sure you discuss any questions you have with your health care provider.  

## 2014-10-26 ENCOUNTER — Ambulatory Visit (INDEPENDENT_AMBULATORY_CARE_PROVIDER_SITE_OTHER): Payer: Medicare Other | Admitting: Family Medicine

## 2014-10-26 ENCOUNTER — Encounter: Payer: Self-pay | Admitting: Family Medicine

## 2014-10-26 VITALS — BP 137/73 | HR 65 | Temp 98.0°F | Ht 62.0 in | Wt 203.4 lb

## 2014-10-26 DIAGNOSIS — I10 Essential (primary) hypertension: Secondary | ICD-10-CM

## 2014-10-26 DIAGNOSIS — M5410 Radiculopathy, site unspecified: Secondary | ICD-10-CM | POA: Diagnosis not present

## 2014-10-26 DIAGNOSIS — E1142 Type 2 diabetes mellitus with diabetic polyneuropathy: Secondary | ICD-10-CM

## 2014-10-26 DIAGNOSIS — E1140 Type 2 diabetes mellitus with diabetic neuropathy, unspecified: Secondary | ICD-10-CM | POA: Diagnosis not present

## 2014-10-26 DIAGNOSIS — K219 Gastro-esophageal reflux disease without esophagitis: Secondary | ICD-10-CM | POA: Diagnosis not present

## 2014-10-26 LAB — BASIC METABOLIC PANEL
BUN: 12 mg/dL (ref 6–23)
CO2: 26 mEq/L (ref 19–32)
Calcium: 9.4 mg/dL (ref 8.4–10.5)
Chloride: 105 mEq/L (ref 96–112)
Creat: 0.66 mg/dL (ref 0.50–1.10)
GLUCOSE: 104 mg/dL — AB (ref 70–99)
Potassium: 4.1 mEq/L (ref 3.5–5.3)
Sodium: 142 mEq/L (ref 135–145)

## 2014-10-26 LAB — POCT GLYCOSYLATED HEMOGLOBIN (HGB A1C): HEMOGLOBIN A1C: 6.7

## 2014-10-26 MED ORDER — NAPROXEN 375 MG PO TABS: 375.0000 mg | ORAL_TABLET | Freq: Two times a day (BID) | ORAL | Status: DC

## 2014-10-26 MED ORDER — BACLOFEN 10 MG PO TABS: 5.0000 mg | ORAL_TABLET | Freq: Three times a day (TID) | ORAL | Status: DC | PRN

## 2014-10-26 MED ORDER — HYDROCHLOROTHIAZIDE 25 MG PO TABS: 25.0000 mg | ORAL_TABLET | Freq: Every day | ORAL | Status: DC

## 2014-10-26 MED ORDER — OMEPRAZOLE 20 MG PO CPDR: 20.0000 mg | DELAYED_RELEASE_CAPSULE | Freq: Two times a day (BID) | ORAL | Status: DC

## 2014-10-26 MED ORDER — RAMIPRIL 10 MG PO CAPS: 10.0000 mg | ORAL_CAPSULE | Freq: Every day | ORAL | Status: DC

## 2014-10-26 MED ORDER — METFORMIN HCL 500 MG PO TABS: 500.0000 mg | ORAL_TABLET | Freq: Two times a day (BID) | ORAL | Status: DC

## 2014-10-26 NOTE — Assessment & Plan Note (Addendum)
Repeat A1c today Continue metformin 500 mg twice a day Follow-up in 3-6 months

## 2014-10-26 NOTE — Patient Instructions (Addendum)
Basis you again today. Keep taking your same medications for diabetes and high blood pressure. We'll get some labs today and I'll send you a letter with the results. Someone will call you about setting up an appointment for physical therapy soon. You can start taking baclofen 3 times a day when necessary for back and leg pain.  Come back to see me in 1-2 months to follow up on your back pain  Take Care, Dr. B  Lipid Panel     Component Value Date/Time   CHOL 116 12/23/2013 1030   TRIG 73 12/23/2013 1030   HDL 52 12/23/2013 1030   CHOLHDL 2.2 12/23/2013 1030   VLDL 15 12/23/2013 1030   LDLCALC 49 12/23/2013 1030     Lab Results  Component Value Date   HGBA1C 6.7 05/31/2014   BMET    Component Value Date/Time   NA 140 12/23/2013 1030   NA 139 04/26/2010   K 3.9 12/23/2013 1030   CL 104 12/23/2013 1030   CO2 26 12/23/2013 1030   GLUCOSE 123* 12/23/2013 1030   BUN 9 12/23/2013 1030   BUN 9 04/26/2010   CREATININE 0.71 12/23/2013 1030   CREATININE 0.74 04/26/2010 2000   CREATININE 0.7 04/26/2010   CALCIUM 9.3 12/23/2013 1030

## 2014-10-26 NOTE — Assessment & Plan Note (Signed)
Symptoms consistent with right sciatic nerve pain History of polio osteopathy likely contributing Referral physical therapy Continue naproxen twice a day Discontinue Percocet Start baclofen 5 mg 3 times a day when necessary Follow-up in 4-6 weeks

## 2014-10-26 NOTE — Assessment & Plan Note (Signed)
Stable Prilosec refilled

## 2014-10-26 NOTE — Progress Notes (Signed)
   Subjective:   [name] is a 69 y.o. female with a history of polio osteopathy, T2DM with peripheral neuropathy, HTN, HLD here for f/u of HTN, T2DM, and leg pain.  HTN - taking HCTZ 25mg  and ramipril 10mg  daily - denies any med SEs, CP, SOB, HAs, vision changes, LE edema - no symptoms of hypoTN  T2DM - taking Metformin 500mg  BID - no med SEs, hypoglycemia - tries to avoid high carbohydrate meals   Leg pain:  - Recently seen in ED 10/20/14 for hip and back pain, diagnosed with sciatica - started having pain in June - had steroid injection in lumbar spine in June with Ortho - pain radiates down back of R leg - taking percocet q4 prn (finished all 15 pills) and naproxen BID scheduled - feeling slightly better - denies any injury or falls - worse with ambulation, better with rest - has not tried muscle relaxer, prednisone, PT  Review of Systems:  Per HPI. All other systems reviewed and are negative.   PMH, PSH, Medications, Allergies, and FmHx reviewed and updated in EMR.  Social History: never smoker  Objective:  BP 137/73 mmHg  Pulse 65  Temp(Src) 98 F (36.7 C) (Oral)  Ht 5\' 2"  (1.575 m)  Wt 203 lb 6.4 oz (92.262 kg)  BMI 37.19 kg/m2  Gen:  69 y.o. female in NAD HEENT: NCAT, MMM, EOMI, PERRL, anicteric sclerae, OP clear CV: RRR, no MRG, intact distal pulses Resp: Non-labored, CTAB, no wheezes noted Ext: WWP, no edema MSK: TTP over R SI joint, paraspinal muscle tightness noted on R side of L spine, +straight leg raise on R side, weakness in R leg chronic 2/2 polio noted and unchanged Neuro: Alert and oriented, speech normal      Chemistry      Component Value Date/Time   NA 140 12/23/2013 1030   NA 139 04/26/2010   K 3.9 12/23/2013 1030   CL 104 12/23/2013 1030   CO2 26 12/23/2013 1030   BUN 9 12/23/2013 1030   BUN 9 04/26/2010   CREATININE 0.71 12/23/2013 1030   CREATININE 0.74 04/26/2010 2000   CREATININE 0.7 04/26/2010   GLU 97 04/26/2010    Component Value Date/Time   CALCIUM 9.3 12/23/2013 1030   ALKPHOS 72 02/27/2011 0942   AST 12 02/27/2011 0942   ALT 9 02/27/2011 0942   BILITOT 0.5 02/27/2011 0942      Lab Results  Component Value Date   WBC 5.1 02/27/2011   HGB 13.0 02/27/2011   HCT 41.6 02/27/2011   MCV 81.7 02/27/2011   PLT 263 02/27/2011   Lab Results  Component Value Date   TSH 0.755 03/24/2012   Lab Results  Component Value Date   HGBA1C 6.7 05/31/2014   Assessment:     [name] is a 69 y.o. female here for HTN, T2DM, R radicular back pain f/u.    Plan:     See problem list for problem-specific plans.   Virginia Crews, MD PGY-2,  [facility] 10/26/2014  8:31 AM

## 2014-10-26 NOTE — Assessment & Plan Note (Signed)
Well-controlled Continue HCTZ 25 mg daily and ramipril 10 mg daily Bmet today Follow-up in 6 months

## 2014-10-27 ENCOUNTER — Encounter: Payer: Self-pay | Admitting: Family Medicine

## 2014-11-01 DIAGNOSIS — Z6837 Body mass index (BMI) 37.0-37.9, adult: Secondary | ICD-10-CM | POA: Diagnosis not present

## 2014-11-01 DIAGNOSIS — M5416 Radiculopathy, lumbar region: Secondary | ICD-10-CM | POA: Diagnosis not present

## 2014-11-01 DIAGNOSIS — M9983 Other biomechanical lesions of lumbar region: Secondary | ICD-10-CM | POA: Diagnosis not present

## 2014-11-01 DIAGNOSIS — M545 Low back pain: Secondary | ICD-10-CM | POA: Diagnosis not present

## 2014-11-01 DIAGNOSIS — M5126 Other intervertebral disc displacement, lumbar region: Secondary | ICD-10-CM | POA: Diagnosis not present

## 2014-11-10 ENCOUNTER — Ambulatory Visit: Payer: Medicare Other | Attending: Emergency Medicine | Admitting: Physical Therapy

## 2014-11-10 DIAGNOSIS — R29898 Other symptoms and signs involving the musculoskeletal system: Secondary | ICD-10-CM

## 2014-11-10 DIAGNOSIS — R293 Abnormal posture: Secondary | ICD-10-CM | POA: Insufficient documentation

## 2014-11-10 DIAGNOSIS — R269 Unspecified abnormalities of gait and mobility: Secondary | ICD-10-CM | POA: Diagnosis not present

## 2014-11-10 DIAGNOSIS — M545 Low back pain, unspecified: Secondary | ICD-10-CM

## 2014-11-10 DIAGNOSIS — M2170 Unequal limb length (acquired), unspecified site: Secondary | ICD-10-CM | POA: Diagnosis not present

## 2014-11-10 NOTE — Therapy (Addendum)
[place], [place], [address] Phone: [phone]   Fax:  [phone]  Physical Therapy Evaluation  Patient Details  Name: [name] MRN: [id number] Date of Birth: October 31, 1945 Referring Provider:  Virginia Crews, MD  Encounter Date: 11/10/2014      PT End of Session - 11/10/14 1105    Visit Number 1   Number of Visits 16   Date for PT Re-Evaluation 01/05/15   Authorization Type Medicare: add KX modifier by 15th visit.   PT Start Time 1015   PT Stop Time 1100   PT Time Calculation (min) 45 min   Activity Tolerance Patient tolerated treatment well   Behavior During Therapy WFL for tasks assessed/performed      Past Medical History  Diagnosis Date  . Diabetes mellitus   . GERD (gastroesophageal reflux disease)   . Hyperlipidemia   . Hypertension   . 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 Arthritis     osteoarthritis  . Back pain, chronic     per pt disc disease, disc herniation- Followed by Dr. Cyndy Freeze  . Carpal tunnel syndrome on both sides     Followed by Dr. Burney Gauze  . Glaucoma   . Trigger finger   . Polio osteopathy of lower leg 1948  . DDD (degenerative disc disease)     Lumbar Spondylosis  . Abnormal Pap smear of cervix 1970    Cryosurgery done  . Vitamin D deficiency     replaced 2010    Past Surgical History  Procedure Laterality Date  . Cholecystectomy    . Hand surgery      Right wrist, s/p plate removal in Feb 2012  . Abdominal hysterectomy      partial  . Joint replacement  2006    left knee    There were no vitals filed for this visit.  Visit Diagnosis:  Bilateral low back pain without sciatica - Plan: PT plan of care cert/re-cert  Right leg weakness - Plan: PT plan of care cert/re-cert  Abnormality of gait - Plan: PT plan of care cert/re-cert  Abnormal posture - Plan: PT plan of care cert/re-cert  Leg length discrepancy  - Plan: PT plan of care cert/re-cert      Subjective Assessment - 11/10/14 1023    Subjective pt is a 69 y.o F with CC of low back pain with radicular symptoms that goes down the calf of the R leg that has been going on for 3-4 years ago. she had this same pain about 3 years ago and recieved an epidural and the pain went away until now. The pain started a couple months ago, with the symptoms getting alittle better.    Limitations Standing;Walking;House hold activities;Lifting;Sitting   How long can you sit comfortably? unlimited(depends on the surface)   How long can you stand comfortably? 15 min   How long can you walk comfortably? 15 min   Diagnostic tests 10/20/2014 x-ray Degenerative type change in the lower   Patient Stated Goals To be pain free during activities, to increase endurance   Currently in Pain? Yes   Pain Score 6    Pain Location Hip   Pain Orientation Right   Pain Descriptors / Indicators Aching   Pain Type Chronic pain   Pain Radiating Towards to the calf of the RLE   Pain Onset More than a month ago   Pain Frequency Intermittent  Aggravating Factors  lifting   Pain Relieving Factors pain medication, heat            [facility] PT Assessment - 11/10/14 1030    Assessment   Medical Diagnosis low back pain with radicular symptoms to RLE    Onset Date/Surgical Date --  couple of months   Hand Dominance Right   Next MD Visit --  by the end of August   Prior Therapy yes   Precautions   Precautions None   Restrictions   Weight Bearing Restrictions No   Balance Screen   Has the patient fallen in the past 6 months No   Has the patient had a decrease in activity level because of a fear of falling?  No   Is the patient reluctant to leave their home because of a fear of falling?  No   Home Environment   Living Environment Private residence   Living Arrangements Alone   Type of [place] to enter   Entrance Stairs-Number of Steps 5    Entrance Stairs-Rails Right   Home Layout One level   [place] - single point;Walker - 2 wheels   Prior Function   Level of Independence Independent with household mobility with device;Independent with community mobility with device;Independent with basic ADLs;Independent   Vocation Retired   Leisure singing in the choir, watching tv,    Cognition   Overall Cognitive Status Within Functional Limits for tasks assessed   Observation/Other Assessments   Observations leg length descrepancy, with sustained hip hike    Focus on Therapeutic Outcomes (FOTO)  47% limited  predicted 39%   Posture/Postural Control   Posture/Postural Control Postural limitations   Postural Limitations Rounded Shoulders;Forward head;Left pelvic obliquity  Leg length descripency shorter leg on L   ROM / Strength   AROM / PROM / Strength AROM;Strength   AROM   AROM Assessment Site Lumbar   Lumbar Flexion 55   Lumbar Extension 10   Lumbar - Right Side Bend 10   Lumbar - Left Side Bend 10   Strength   Strength Assessment Site Hip;Knee   Right/Left Hip Right;Left   Right Hip Flexion 2/5   Right Hip Extension 2/5   Right Hip ABduction 2/5   Right Hip ADduction 2/5   Left Hip Flexion 4/5   Left Hip Extension 4/5   Left Hip ABduction 4/5   Left Hip ADduction 4/5   Right/Left Knee Right;Left   Right Knee Flexion 3/5   Right Knee Extension 3-/5   Left Knee Flexion 4+/5   Left Knee Extension 4+/5   Special Tests    Special Tests Lumbar   Lumbar Tests Prone Knee Bend Test;Straight Leg Raise;Slump Test   Slump test   Findings Negative   Prone Knee Bend Test   Findings Negative   Straight Leg Raise   Findings Negative   Ambulation/Gait   Gait Pattern Step-to pattern;Decreased stride length;Decreased stance time - right;Decreased step length - left;Lateral trunk lean to left;Decreased trunk rotation;Trunk flexed;Narrow base of support;Poor foot clearance - right;Antalgic;Decreased weight shift to  right  with [facility]                           PT Education - 11/10/14 1104    Education provided Yes   Education Details evaulation findings, POC, goals, HEP   Person(s) Educated Patient   Methods Explanation   Comprehension Verbalized understanding  PT Short Term Goals - 11/10/14 1116    PT SHORT TERM GOAL #1   Title pt will be I with initial HEP (12/11/2014)   Time 4   Period Weeks   Status New   PT SHORT TERM GOAL #2   Title She will demonstrate pain to <5/10 during and following standing /walking for > 15 minutes to help with walking endurance (12/11/2014)   Time 4   Period Weeks   Status New   PT SHORT TERM GOAL #3   Title pt will be able to don/doff orthosis an utilize it correctly  to correct leg length descrepancy to assist with functional progression and safe functional mobility (12/11/2014)   Time 4   Period Weeks   Status New   PT SHORT TERM GOAL #4   Title She will increase FOTO score by > 10 points to help with increased functional capacity (12/11/2014)   Time 4   Period Weeks   Status New           PT Long Term Goals - 11/10/14 1120    PT LONG TERM GOAL #1   Title pt will be I with all HEP givne throughout therapy at discharge (01/05/2015)   Time 8   Period Weeks   Status New   PT LONG TERM GOAL #2   Title She will demonstrate increased strength to >3/5 in the R LE to help with walking/standing endurance and safety during amb with LRAD (01/05/2015)   Time 8   Period Weeks   Status New   PT LONG TERM GOAL #3   Title pt will be able to stand/ walk with LRAD for > 20 min with < 3/10 pain during and following activity to promote and increase community ambulation (01/05/2015)   Time 8   Period Weeks   Status New   PT LONG TERM GOAL #4   Title pt will be able to verbalize and demonstrate techniques to reduce low back reinjury via postural awareness, correct utilization of DME, lifting and carrying mechanics, HEP (01/05/2015)   Time 8    Period Weeks   Status New   PT LONG TERM GOAL #5   Title She will increase her FOTO score to > 39% limited to demonstrate improved functional capacity upon discharge (01/05/2015)   Time 8   Period Weeks   Status New               Plan - 11/10/14 1106    Clinical Impression Statement Savreen presents to OPPT with CC of R low back pain that has been present for the last couple of months with a previous episode 3-4 years ago. she has a hx of polio and currently demonstrating post polio syndrome with limited strength, and muscle tone on the R LE compared bil with signifcant weakness in RLE compared bil. special testing was postivie with Sacral thrust, and gaenslens demonstrateing SI involvment. and lumbar special tests were negative. She currently ambulates and with a SPC utilizing and antalgic gait pattern with decrased step length on the L with limited stance time on the R with sustained weight shift to the L. She demonstrates limited trunk mobility with no pain noted during assessment.  palpation revealed tenderness at the bil SI joints with R>L and tenderness in bil posterior hip musculature. she exhibits a true  leg length descrepancy with the R leg longer than the Left, with  R pelvic obliquity. She reports having a brace and  shoe with a  thick heel but doesn't wear it. She would benefit from skilled physical therapy to maxmize her function by addressing the impairments listed.    Pt will benefit from skilled therapeutic intervention in order to improve on the following deficits Abnormal gait;Decreased activity tolerance;Decreased balance;Decreased mobility;Decreased knowledge of use of DME;Decreased endurance;Decreased range of motion;Decreased safety awareness;Decreased strength;Postural dysfunction;Improper body mechanics;Impaired flexibility;Pain;Difficulty walking   Rehab Potential Good   PT Frequency 2x / week   PT Duration 8 weeks   PT Treatment/Interventions ADLs/Self Care Home  Management;Cryotherapy;Electrical Stimulation;Moist Heat;Iontophoresis 4mg /ml Dexamethasone;Ultrasound;DME Instruction;Gait training;Stair training;Functional mobility training;Therapeutic activities;Therapeutic exercise;Balance training;Neuromuscular re-education;Patient/family education;Manual techniques;Passive range of motion;Taping;Orthotic Fit/Training   PT Next Visit Plan assess response to HEP, core strengthening, SI mobilization, orthosis training? (if she brings it in), modalities PRN, 10 MWT   PT Home Exercise Plan see HEP   Consulted and Agree with Plan of Care Patient          G-Codes - 11/15/14 1125    Functional Limitation Mobility: Walking and moving around   Mobility: Walking and Moving Around Current Status [phone]) At least 40 percent but less than 60 percent impaired, limited or restricted   Mobility: Walking and Moving Around Goal Status [phone]) At least 20 percent but less than 40 percent impaired, limited or restricted       Problem List Patient Active Problem List   Diagnosis Date Noted  . GERD (gastroesophageal reflux disease) 10/26/2014  . Healthcare maintenance 05/31/2014  . T2DM (type 2 diabetes mellitus) 05/31/2014  . 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 Viral URI with cough 06/08/2013  . Flank pain 06/19/2012  . Obesity (BMI 30-39.9) 03/24/2012  . Biceps tendon tear 08/10/2010  . Shoulder pain 08/10/2010  . HTN (hypertension) 08/03/2010  . Hyperlipidemia 08/03/2010  . Glaucoma 08/03/2010  . Radicular low back pain 08/03/2010   Starr Lake PT, DPT, LAT, ATC  11/15/2014  12:05 PM    [facility] [address] [place], [place], [address] Phone: [phone]   Fax:  [phone]

## 2014-11-10 NOTE — Patient Instructions (Signed)
   Brolin Dambrosia PT, DPT, LAT, ATC  [facility] Phone: [phone]     

## 2014-11-17 ENCOUNTER — Telehealth: Payer: Self-pay | Admitting: Family Medicine

## 2014-11-17 NOTE — Telephone Encounter (Signed)
Forms placed in PCP box. 

## 2014-11-17 NOTE — Telephone Encounter (Signed)
Patient request PCP to complete form for SCAT and disability. Please, follow up with Patient.

## 2014-11-21 NOTE — Telephone Encounter (Signed)
Form completed and given to Lorraine Lax, MD, MPH PGY-2,  [facility] 11/21/2014 3:23 PM

## 2014-11-22 NOTE — Telephone Encounter (Signed)
Patient informed that form is complete and ready for pick up.  Martin, Tamika L, RN  

## 2014-11-28 ENCOUNTER — Ambulatory Visit: Payer: Medicare Other | Admitting: Physical Therapy

## 2014-12-01 ENCOUNTER — Ambulatory Visit: Payer: Medicare Other | Admitting: Physical Therapy

## 2014-12-01 ENCOUNTER — Other Ambulatory Visit: Payer: Self-pay | Admitting: Family Medicine

## 2014-12-01 DIAGNOSIS — M545 Low back pain, unspecified: Secondary | ICD-10-CM

## 2014-12-01 DIAGNOSIS — M2170 Unequal limb length (acquired), unspecified site: Secondary | ICD-10-CM | POA: Diagnosis not present

## 2014-12-01 DIAGNOSIS — R269 Unspecified abnormalities of gait and mobility: Secondary | ICD-10-CM | POA: Diagnosis not present

## 2014-12-01 DIAGNOSIS — R293 Abnormal posture: Secondary | ICD-10-CM | POA: Diagnosis not present

## 2014-12-01 DIAGNOSIS — R29898 Other symptoms and signs involving the musculoskeletal system: Secondary | ICD-10-CM | POA: Diagnosis not present

## 2014-12-01 NOTE — Therapy (Signed)
[place], [place], [address] Phone: [phone]   Fax:  [phone]  Physical Therapy Treatment  Patient Details  Name: [name] MRN: [id number] Date of Birth: 11/22/1945 Referring Provider:  Virginia Crews, MD  Encounter Date: 12/01/2014      PT End of Session - 12/01/14 1151    Visit Number 2   Number of Visits 16   Date for PT Re-Evaluation 01/05/15   Authorization Type Medicare: add KX modifier by 15th visit.   PT Start Time 1146   PT Stop Time 1247   PT Time Calculation (min) 61 min      Past Medical History  Diagnosis Date  . Diabetes mellitus   . GERD (gastroesophageal reflux disease)   . Hyperlipidemia   . Hypertension   . 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 Arthritis     osteoarthritis  . Back pain, chronic     per pt disc disease, disc herniation- Followed by Dr. Cyndy Freeze  . Carpal tunnel syndrome on both sides     Followed by Dr. Burney Gauze  . Glaucoma   . Trigger finger   . Polio osteopathy of lower leg 1948  . DDD (degenerative disc disease)     Lumbar Spondylosis  . Abnormal Pap smear of cervix 1970    Cryosurgery done  . Vitamin D deficiency     replaced 2010    Past Surgical History  Procedure Laterality Date  . Cholecystectomy    . Hand surgery      Right wrist, s/p plate removal in Feb 2012  . Abdominal hysterectomy      partial  . Joint replacement  2006    left knee    There were no vitals filed for this visit.  Visit Diagnosis:  Right leg weakness  Abnormality of gait  Abnormal posture  Leg length discrepancy  Bilateral low back pain without sciatica      Subjective Assessment - 12/01/14 1149    Subjective Back only hurts with sweeping  and mopping. I have to stop. My leg is hurting today.    Currently in Pain? Yes   Pain Score 8    Pain Location Hip  posterior thigh and posterior knee   Pain  Orientation Right   Pain Descriptors / Indicators Aching   Pain Type Chronic pain   Aggravating Factors  housework   Pain Relieving Factors heat, meds            [facility] PT Assessment - 12/01/14 0001    Ambulation/Gait   Ambulation/Gait Yes   Ambulation/Gait Assistance 6: Modified independent (Device/Increase time)   Ambulation Distance (Feet) 32 Feet  10 MWT   Assistive device Straight cane   Gait Pattern Step-to pattern;Decreased stride length;Decreased stance time - right;Decreased step length - left;Lateral trunk lean to left;Decreased trunk rotation;Trunk flexed;Narrow base of support;Poor foot clearance - right;Antalgic;Decreased weight shift to right  with SPC   Gait velocity 10 MWT in 16 seconds                     [facility] Adult PT Treatment/Exercise - 12/01/14 1208    Lumbar Exercises: Stretches   Lower Trunk Rotation 5 reps;10 seconds   Pelvic Tilt 5 reps;10 seconds   Prone on Elbows Stretch 60 seconds   Press Ups 5 reps;10 seconds   Lumbar Exercises: Standing   Other Standing Lumbar Exercises Sit- stand  x 12   Lumbar Exercises: Seated   Long Arc Quad on Chair 20 reps   LAQ on Chair Limitations left only due to weakness on right   Other Seated Lumbar Exercises heel and toe raises per HEP   Lumbar Exercises: Supine   Bridge 10 reps   Lumbar Exercises: Sidelying   Clam 15 reps  bilateral   Clam Limitations with ab set   Lumbar Exercises: Prone   Other Prone Lumbar Exercises Multifidus activation x 10, then hold with hamstring curls x 10 bilateral   Modalities   Modalities Moist Heat   Moist Heat Therapy   Number Minutes Moist Heat 15 Minutes   Moist Heat Location Lumbar Spine                PT Education - 12/01/14 1300    Education provided Yes   Education Details POE, prone press ups, Multifidus with h/s curls- handouts from file cabinet   Person(s) Educated Patient   Methods Explanation;Handout   Comprehension Verbalized understanding           PT Short Term Goals - 11/10/14 1116    PT SHORT TERM GOAL #1   Title pt will be I with initial HEP (12/11/2014)   Time 4   Period Weeks   Status New   PT SHORT TERM GOAL #2   Title She will demonstrate pain to <5/10 during and following standing /walking for > 15 minutes to help with walking endurance (12/11/2014)   Time 4   Period Weeks   Status New   PT SHORT TERM GOAL #3   Title pt will be able to don/doff orthosis an utilize it correctly  to correct leg length descrepancy to assist with functional progression and safe functional mobility (12/11/2014)   Time 4   Period Weeks   Status New   PT SHORT TERM GOAL #4   Title She will increase FOTO score by > 10 points to help with increased functional capacity (12/11/2014)   Time 4   Period Weeks   Status New           PT Long Term Goals - 11/10/14 1120    PT LONG TERM GOAL #1   Title pt will be I with all HEP givne throughout therapy at discharge (01/05/2015)   Time 8   Period Weeks   Status New   PT LONG TERM GOAL #2   Title She will demonstrate increased strength to >3/5 in the R LE to help with walking/standing endurance and safety during amb with LRAD (01/05/2015)   Time 8   Period Weeks   Status New   PT LONG TERM GOAL #3   Title pt will be able to stand/ walk with LRAD for > 20 min with < 3/10 pain during and following activity to promote and increase community ambulation (01/05/2015)   Time 8   Period Weeks   Status New   PT LONG TERM GOAL #4   Title pt will be able to verbalize and demonstrate techniques to reduce low back reinjury via postural awareness, correct utilization of DME, lifting and carrying mechanics, HEP (01/05/2015)   Time 8   Period Weeks   Status New   PT LONG TERM GOAL #5   Title She will increase her FOTO score to > 39% limited to demonstrate improved functional capacity upon discharge (01/05/2015)   Time 8   Period Weeks   Status New  Plan - 12/01/14 1250     Clinical Impression Statement Pt presents with right posterior leg pain. SHe reports is comes and goes and she usually takes pain medication that helps. Instructed pt in prone lying with pressups which pt reports decreased symptoms following. Also instructed pt in prone multifidus training and pt was able to perform hamstring curls. Added to HEP. Also check ed 10MWT with pt performing in 16 seconds wih SPC. Encourgaed pt to bring orthosis and to check to see if she could get a new one since this one was issued years ago.   PT Next Visit Plan review prone HEP from last visit, core strengthening, SI mobilization, orthosis training? (if she brings it in), modalities PRN         Problem List Patient Active Problem List   Diagnosis Date Noted  . GERD (gastroesophageal reflux disease) 10/26/2014  . Healthcare maintenance 05/31/2014  . T2DM (type 2 diabetes mellitus) 05/31/2014  . 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 Viral URI with cough 06/08/2013  . Flank pain 06/19/2012  . Obesity (BMI 30-39.9) 03/24/2012  . Biceps tendon tear 08/10/2010  . Shoulder pain 08/10/2010  . HTN (hypertension) 08/03/2010  . Hyperlipidemia 08/03/2010  . Glaucoma 08/03/2010  . Radicular low back pain 08/03/2010    Dorene Ar, [place] 12/01/2014, 1:07 PM  [facility] [address] [place], [place], [address] Phone: [phone]   Fax:  [phone]

## 2014-12-05 ENCOUNTER — Ambulatory Visit: Payer: Medicare Other | Admitting: Physical Therapy

## 2014-12-05 DIAGNOSIS — R29898 Other symptoms and signs involving the musculoskeletal system: Secondary | ICD-10-CM | POA: Diagnosis not present

## 2014-12-05 DIAGNOSIS — R269 Unspecified abnormalities of gait and mobility: Secondary | ICD-10-CM

## 2014-12-05 DIAGNOSIS — M545 Low back pain, unspecified: Secondary | ICD-10-CM

## 2014-12-05 DIAGNOSIS — R293 Abnormal posture: Secondary | ICD-10-CM

## 2014-12-05 DIAGNOSIS — E1140 Type 2 diabetes mellitus with diabetic neuropathy, unspecified: Secondary | ICD-10-CM | POA: Diagnosis not present

## 2014-12-05 DIAGNOSIS — M2170 Unequal limb length (acquired), unspecified site: Secondary | ICD-10-CM

## 2014-12-05 NOTE — Therapy (Signed)
[place], [place], [address] Phone: [phone]   Fax:  [phone]  Physical Therapy Treatment  Patient Details  Name: [name] MRN: [id number] Date of Birth: 19-Aug-1945 Referring Provider:  Virginia Crews, MD  Encounter Date: 12/05/2014      PT End of Session - 12/05/14 1217    Visit Number 3   Number of Visits 16   Date for PT Re-Evaluation 01/05/15   Authorization Type Medicare: add KX modifier by 15th visit.   PT Start Time 1146   PT Stop Time 1225   PT Time Calculation (min) 39 min   Activity Tolerance Patient tolerated treatment well      Past Medical History  Diagnosis Date  . Diabetes mellitus   . GERD (gastroesophageal reflux disease)   . Hyperlipidemia   . Hypertension   . 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 Arthritis     osteoarthritis  . Back pain, chronic     per pt disc disease, disc herniation- Followed by Dr. Cyndy Freeze  . Carpal tunnel syndrome on both sides     Followed by Dr. Burney Gauze  . Glaucoma   . Trigger finger   . Polio osteopathy of lower leg 1948  . DDD (degenerative disc disease)     Lumbar Spondylosis  . Abnormal Pap smear of cervix 1970    Cryosurgery done  . Vitamin D deficiency     replaced 2010    Past Surgical History  Procedure Laterality Date  . Cholecystectomy    . Hand surgery      Right wrist, s/p plate removal in Feb 2012  . Abdominal hysterectomy      partial  . Joint replacement  2006    left knee    There were no vitals filed for this visit.  Visit Diagnosis:  Right leg weakness  Abnormality of gait  Abnormal posture  Leg length discrepancy  Bilateral low back pain without sciatica      Subjective Assessment - 12/05/14 1150    Subjective no changes so far.  Took a pain pill earlier this morning for left leg pain.     Currently in Pain? No/denies   Pain Score 0-No pain   Pain  Orientation Left   Pain Type Chronic pain   Aggravating Factors  standing for long period of time and it bothers my left leg   Pain Relieving Factors pain medicine, sitting down, walk pushing grocery cart, [facility] [employer] and [facility] body [facility], does ex class some standing and sitting                         [facility] Adult PT Treatment/Exercise - 12/05/14 1153    Lumbar Exercises: Seated   Other Seated Lumbar Exercises 2# shoulder to shoulder with ab brace 2x10   Lumbar Exercises: Supine   Ab Set 10 reps   Bridge 10 reps   Other Supine Lumbar Exercises ab set with reach to opposite knee 8x   Knee/Hip Exercises: Seated   Long Arc Quad Left;1 set;15 reps   Clamshell with TheraBand Red  20x   Marching Strengthening;Left;1 set;15 reps  red band   Hamstring Curl Strengthening;Left;1 set;15 reps  red band   Sit to Sand 1 set;15 reps;without UE support  high table   Shoulder Exercises: Seated   Row Both;15 reps;Theraband   Theraband Level (Shoulder  Row) Level 2 (Red)   Other Seated Exercises seated red band shoulder extension 15x                  PT Short Term Goals - 12/05/14 1235    PT SHORT TERM GOAL #1   Title pt will be I with initial HEP (12/11/2014)   Time 4   Period Weeks   Status On-going   PT SHORT TERM GOAL #2   Title She will demonstrate pain to <5/10 during and following standing /walking for > 15 minutes to help with walking endurance (12/11/2014)   Time 4   Period Weeks   Status On-going   PT SHORT TERM GOAL #3   Title pt will be able to don/doff orthosis an utilize it correctly  to correct leg length descrepancy to assist with functional progression and safe functional mobility (12/11/2014)   Time 4   Period Weeks   Status On-going   PT SHORT TERM GOAL #4   Title She will increase FOTO score by > 10 points to help with increased functional capacity (12/11/2014)   Time 4   Period Weeks   Status On-going           PT Long Term Goals  - 12/05/14 1235    PT LONG TERM GOAL #1   Title pt will be I with all HEP givne throughout therapy at discharge (01/05/2015)   Time 8   Period Weeks   Status On-going   PT LONG TERM GOAL #2   Title She will demonstrate increased strength to >3/5 in the R LE to help with walking/standing endurance and safety during amb with LRAD (01/05/2015)   Time 8   Period Weeks   Status On-going   PT LONG TERM GOAL #3   Title pt will be able to stand/ walk with LRAD for > 20 min with < 3/10 pain during and following activity to promote and increase community ambulation (01/05/2015)   Time 8   Period Weeks   Status On-going   PT LONG TERM GOAL #4   Title pt will be able to verbalize and demonstrate techniques to reduce low back reinjury via postural awareness, correct utilization of DME, lifting and carrying mechanics, HEP (01/05/2015)   Time 8   Period Weeks   Status On-going   PT LONG TERM GOAL #5   Title She will increase her FOTO score to > 39% limited to demonstrate improved functional capacity upon discharge (01/05/2015)   Time 8   Period Weeks   Status On-going               Plan - 12/05/14 1218    Clinical Impression Statement The patient denies back or LE pain during and after treatment session.  She does report LE fatigue.  Therapist closely monitoring response throughout treatment session and including fatigue level (post-polio syndrome).  Continue with treatment plan.   PT Next Visit Plan Continue core strengthening, modalities if needed for pain;          Problem List Patient Active Problem List   Diagnosis Date Noted  . GERD (gastroesophageal reflux disease) 10/26/2014  . Healthcare maintenance 05/31/2014  . T2DM (type 2 diabetes mellitus) 05/31/2014  . 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 Viral URI with cough 06/08/2013  . Flank pain 06/19/2012  . Obesity (BMI 30-39.9) 03/24/2012  . Biceps tendon tear 08/10/2010  . Shoulder pain 08/10/2010  . HTN  (hypertension) 08/03/2010  . Hyperlipidemia 08/03/2010  .  Glaucoma 08/03/2010  . Radicular low back pain 08/03/2010    Alvera Singh 12/05/2014, 12:36 PM  [facility] [address] [place], [place], [address] Phone: [phone]   Fax:  [phone]  Ruben Im, PT 12/05/2014 12:37 PM Phone: [phone] Fax: [phone]

## 2014-12-06 DIAGNOSIS — E1140 Type 2 diabetes mellitus with diabetic neuropathy, unspecified: Secondary | ICD-10-CM | POA: Diagnosis not present

## 2014-12-08 ENCOUNTER — Encounter: Payer: Medicare Other | Admitting: Physical Therapy

## 2014-12-13 ENCOUNTER — Ambulatory Visit: Payer: Medicare Other | Attending: Emergency Medicine | Admitting: Physical Therapy

## 2014-12-13 DIAGNOSIS — M2170 Unequal limb length (acquired), unspecified site: Secondary | ICD-10-CM | POA: Diagnosis not present

## 2014-12-13 DIAGNOSIS — R29898 Other symptoms and signs involving the musculoskeletal system: Secondary | ICD-10-CM

## 2014-12-13 DIAGNOSIS — R269 Unspecified abnormalities of gait and mobility: Secondary | ICD-10-CM | POA: Diagnosis not present

## 2014-12-13 DIAGNOSIS — M545 Low back pain, unspecified: Secondary | ICD-10-CM

## 2014-12-13 DIAGNOSIS — R293 Abnormal posture: Secondary | ICD-10-CM

## 2014-12-13 NOTE — Therapy (Signed)
[place], [place], [address] Phone: [phone]   Fax:  [phone]  Physical Therapy Treatment  Patient Details  Name: [name] MRN: [id number] Date of Birth: 14-Feb-1946 Referring Provider:  Virginia Crews, MD  Encounter Date: 12/13/2014      PT End of Session - 12/13/14 1231    Visit Number 4   Number of Visits 16   Date for PT Re-Evaluation 01/05/15   Authorization Type Medicare: add KX modifier by 15th visit.   PT Start Time 1147   PT Stop Time 1231   PT Time Calculation (min) 44 min      Past Medical History  Diagnosis Date  . Diabetes mellitus   . GERD (gastroesophageal reflux disease)   . Hyperlipidemia   . Hypertension   . 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 Arthritis     osteoarthritis  . Back pain, chronic     per pt disc disease, disc herniation- Followed by Dr. Cyndy Freeze  . Carpal tunnel syndrome on both sides     Followed by Dr. Burney Gauze  . Glaucoma   . Trigger finger   . Polio osteopathy of lower leg 1948  . DDD (degenerative disc disease)     Lumbar Spondylosis  . Abnormal Pap smear of cervix 1970    Cryosurgery done  . Vitamin D deficiency     replaced 2010    Past Surgical History  Procedure Laterality Date  . Cholecystectomy    . Hand surgery      Right wrist, s/p plate removal in Feb 2012  . Abdominal hysterectomy      partial  . Joint replacement  2006    left knee    There were no vitals filed for this visit.  Visit Diagnosis:  Right leg weakness  Abnormality of gait  Abnormal posture  Leg length discrepancy  Bilateral low back pain without sciatica                       [facility] Adult PT Treatment/Exercise - 12/13/14 1211    Lumbar Exercises: Supine   Clam 10 reps   Clam Limitations with ab set   Bridge 10 reps   Bridge Limitations added ball-increased left leg strain   Straight Leg  Raise 10 reps   Straight Leg Raises Limitations left only with ab set   Other Supine Lumbar Exercises ab set with reach to opposite knee 8x   Other Supine Lumbar Exercises ball squeeze x 10   Lumbar Exercises: Sidelying   Clam 15 reps  bilateral   Clam Limitations with ab set   Knee/Hip Exercises: Seated   Long Arc Quad Left;1 set;15 reps   Long Arc Quad Weight 3 lbs.   Ball Squeeze x10   Marching Strengthening;Left;15 reps   Marching Limitations unable on right    Hamstring Curl Strengthening;Left;1 set;15 reps  red band   Sit to Sand 1 set;15 reps;without UE support  elevated seat with foam pad                  PT Short Term Goals - 12/13/14 1229    PT SHORT TERM GOAL #1   Title pt will be I with initial HEP (12/11/2014)   Time 4   Period Weeks   Status Achieved   PT SHORT TERM GOAL #2   Title She will demonstrate pain to <5/10  during and following standing /walking for > 15 minutes to help with walking endurance (12/11/2014)   Time 4   Period Weeks   Status Achieved   PT SHORT TERM GOAL #3   Title pt will be able to don/doff orthosis an utilize it correctly  to correct leg length descrepancy to assist with functional progression and safe functional mobility (12/11/2014)   Time 4   Period Weeks   Status Achieved  she is wearing it more and wears it to PT appts   PT SHORT TERM GOAL #4   Title She will increase FOTO score by > 10 points to help with increased functional capacity (12/11/2014)   Time 4   Period Weeks   Status Unable to assess           PT Long Term Goals - 12/05/14 1235    PT LONG TERM GOAL #1   Title pt will be I with all HEP givne throughout therapy at discharge (01/05/2015)   Time 8   Period Weeks   Status On-going   PT LONG TERM GOAL #2   Title She will demonstrate increased strength to >3/5 in the R LE to help with walking/standing endurance and safety during amb with LRAD (01/05/2015)   Time 8   Period Weeks   Status On-going   PT LONG  TERM GOAL #3   Title pt will be able to stand/ walk with LRAD for > 20 min with < 3/10 pain during and following activity to promote and increase community ambulation (01/05/2015)   Time 8   Period Weeks   Status On-going   PT LONG TERM GOAL #4   Title pt will be able to verbalize and demonstrate techniques to reduce low back reinjury via postural awareness, correct utilization of DME, lifting and carrying mechanics, HEP (01/05/2015)   Time 8   Period Weeks   Status On-going   PT LONG TERM GOAL #5   Title She will increase her FOTO score to > 39% limited to demonstrate improved functional capacity upon discharge (01/05/2015)   Time 8   Period Weeks   Status On-going               Plan - 12/13/14 1201    Clinical Impression Statement Pt reports able to shop Saturday and did not have back/hip pain for 45 minutes.  STG #!,#2, #3 MET. Pt reports new onset of left lateral leg pain since last visit. Palpable tenderness relieved with soft tissue work. Pt reports continued  pain/difficulty with mopping. Instructed pt in proper body mechanics with mopping. Pt may have difficulty with proper stance due to right LE weakness.    PT Next Visit Plan Continue core strengthening, modalities if needed for pain;  BODY MECHANICS NEXT        Problem List Patient Active Problem List   Diagnosis Date Noted  . GERD (gastroesophageal reflux disease) 10/26/2014  . Healthcare maintenance 05/31/2014  . T2DM (type 2 diabetes mellitus) 05/31/2014  . 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 Viral URI with cough 06/08/2013  . Flank pain 06/19/2012  . Obesity (BMI 30-39.9) 03/24/2012  . Biceps tendon tear 08/10/2010  . Shoulder pain 08/10/2010  . HTN (hypertension) 08/03/2010  . Hyperlipidemia 08/03/2010  . Glaucoma 08/03/2010  . Radicular low back pain 08/03/2010    Dorene Ar, [place] 12/13/2014, 12:32 PM  [facility] [address] [place], [place], [address] Phone: [phone]   Fax:  [phone]      

## 2014-12-16 ENCOUNTER — Ambulatory Visit: Payer: Medicare Other | Admitting: Physical Therapy

## 2014-12-16 DIAGNOSIS — M2170 Unequal limb length (acquired), unspecified site: Secondary | ICD-10-CM

## 2014-12-16 DIAGNOSIS — M545 Low back pain, unspecified: Secondary | ICD-10-CM

## 2014-12-16 DIAGNOSIS — R293 Abnormal posture: Secondary | ICD-10-CM

## 2014-12-16 DIAGNOSIS — R29898 Other symptoms and signs involving the musculoskeletal system: Secondary | ICD-10-CM | POA: Diagnosis not present

## 2014-12-16 DIAGNOSIS — R269 Unspecified abnormalities of gait and mobility: Secondary | ICD-10-CM

## 2014-12-16 NOTE — Therapy (Signed)
[place], [place], [address] Phone: [phone]   Fax:  [phone]  Physical Therapy Treatment  Patient Details  Name: [name] MRN: [id number] Date of Birth: 01/13/1946 Referring Provider:  Virginia Crews, MD  Encounter Date: 12/16/2014      PT End of Session - 12/16/14 1100    Visit Number 5   Number of Visits 16   Date for PT Re-Evaluation 01/05/15   Authorization Type Medicare: add KX modifier by 15th visit.   PT Start Time 1057   PT Stop Time 1145   PT Time Calculation (min) 48 min      Past Medical History  Diagnosis Date  . Diabetes mellitus   . GERD (gastroesophageal reflux disease)   . Hyperlipidemia   . Hypertension   . 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 Arthritis     osteoarthritis  . Back pain, chronic     per pt disc disease, disc herniation- Followed by Dr. Cyndy Freeze  . Carpal tunnel syndrome on both sides     Followed by Dr. Burney Gauze  . Glaucoma   . Trigger finger   . Polio osteopathy of lower leg 1948  . DDD (degenerative disc disease)     Lumbar Spondylosis  . Abnormal Pap smear of cervix 1970    Cryosurgery done  . Vitamin D deficiency     replaced 2010    Past Surgical History  Procedure Laterality Date  . Cholecystectomy    . Hand surgery      Right wrist, s/p plate removal in Feb 2012  . Abdominal hysterectomy      partial  . Joint replacement  2006    left knee    There were no vitals filed for this visit.  Visit Diagnosis:  Right leg weakness  Abnormality of gait  Abnormal posture  Leg length discrepancy  Bilateral low back pain without sciatica      Subjective Assessment - 12/16/14 1108    Subjective no pain   Currently in Pain? No/denies            [facility] PT Assessment - 12/16/14 1154    Observation/Other Assessments   Focus on Therapeutic Outcomes (FOTO)  40% limitation improved from 47%                      [facility] Adult PT Treatment/Exercise - 12/16/14 0001    Self-Care   Self-Care ADL's;Lifting   ADL's Posture and Body mechanics handout reviewed with patient.    Lifting Lifting reviewed with patient    Lumbar Exercises: Supine   Clam 10 reps   Clam Limitations left only   Bent Knee Raise 10 reps   Bent Knee Raise Limitations left only   Lumbar Exercises: Prone   Other Prone Lumbar Exercises Multifidus activation x 10, then hold with hamstring curls x 10 bilateral, then bilateral hamstring curls with legs crossed x 10,                   PT Short Term Goals - 12/16/14 1156    PT SHORT TERM GOAL #1   Title pt will be I with initial HEP (12/11/2014)   Time 4   Period Weeks   Status Achieved   PT SHORT TERM GOAL #2   Title She will demonstrate pain to <5/10 during and following standing /walking for > 15 minutes to help  with walking endurance (12/11/2014)   Time 4   Period Weeks   Status Achieved   PT SHORT TERM GOAL #3   Title pt will be able to don/doff orthosis an utilize it correctly  to correct leg length descrepancy to assist with functional progression and safe functional mobility (12/11/2014)   Time 4   Period Weeks   Status Achieved   PT SHORT TERM GOAL #4   Title She will increase FOTO score by > 10 points to help with increased functional capacity (12/11/2014)   Time 4   Period Weeks   Status Achieved           PT Long Term Goals - 12/16/14 1155    PT LONG TERM GOAL #1   Title pt will be I with all HEP givne throughout therapy at discharge (01/05/2015)   Time 8   Period Weeks   Status On-going   PT LONG TERM GOAL #2   Title She will demonstrate increased strength to >3/5 in the R LE to help with walking/standing endurance and safety during amb with LRAD (01/05/2015)   Time 8   Period Weeks   Status On-going   PT LONG TERM GOAL #3   Title pt will be able to stand/ walk with LRAD for > 20 min with < 3/10 pain during and  following activity to promote and increase community ambulation (01/05/2015)   Time 8   Period Weeks   Status Achieved   PT LONG TERM GOAL #4   Title pt will be able to verbalize and demonstrate techniques to reduce low back reinjury via postural awareness, correct utilization of DME, lifting and carrying mechanics, HEP (01/05/2015)   Time 8   Period Weeks   Status Achieved   PT LONG TERM GOAL #5   Title She will increase her FOTO score to > 39% limited to demonstrate improved functional capacity upon discharge (01/05/2015)   Time 8   Period Weeks   Status Partially Met               Plan - 12/16/14 1156    Clinical Impression Statement Pt reports new onset of leg pain present last visit is now gone. She continues to notice improved tolerance to standing and walking in stores up to 1hour with SPC or cart without pain. Her FOTO score has imoproved from 47% limited to 40% limited since EVAL. Focus today on Posture and Body mechanics demonstration and modifications due to her RLE weakness. Pt receptive to modification for home to protect spine. Reviewed Prone pelvic press series from previos visit. Pt has not been performing these. She will try them  at home. LTG #3,4 MET, All STGs Met. Foto score goal nearly net.    PT Next Visit Plan Continue Core and LE as able, stream line HEP, will be ready for discharge by end of scheduled visits or sooner.         Problem List Patient Active Problem List   Diagnosis Date Noted  . GERD (gastroesophageal reflux disease) 10/26/2014  . Healthcare maintenance 05/31/2014  . T2DM (type 2 diabetes mellitus) 05/31/2014  . 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 Viral URI with cough 06/08/2013  . Flank pain 06/19/2012  . Obesity (BMI 30-39.9) 03/24/2012  . Biceps tendon tear 08/10/2010  . Shoulder pain 08/10/2010  . HTN (hypertension) 08/03/2010  . Hyperlipidemia 08/03/2010  . Glaucoma 08/03/2010  . Radicular low back pain 08/03/2010     Dorene Ar,  PTA 12/16/2014, 12:04 PM  [place], [place], [address] Phone: [phone]   Fax:  [phone]

## 2014-12-19 ENCOUNTER — Ambulatory Visit: Payer: Medicare Other | Admitting: Physical Therapy

## 2014-12-19 DIAGNOSIS — M545 Low back pain, unspecified: Secondary | ICD-10-CM

## 2014-12-19 DIAGNOSIS — R269 Unspecified abnormalities of gait and mobility: Secondary | ICD-10-CM

## 2014-12-19 DIAGNOSIS — R293 Abnormal posture: Secondary | ICD-10-CM | POA: Diagnosis not present

## 2014-12-19 DIAGNOSIS — M2170 Unequal limb length (acquired), unspecified site: Secondary | ICD-10-CM

## 2014-12-19 DIAGNOSIS — R29898 Other symptoms and signs involving the musculoskeletal system: Secondary | ICD-10-CM | POA: Diagnosis not present

## 2014-12-19 NOTE — Patient Instructions (Signed)
Isometric Hold (Quadruped)   On hands and knees, slowly inhale, and then exhale. Pull navel toward spine and Hold for __5_ seconds. Continue to breathe in and out during hold. Rest for ___ seconds. Repeat __10_ times. Do _2__ times a day.   Copyright  VHI. All rights reserved.  Bracing With Arm Raise (Quadruped)   On hands and knees find neutral spine. Tighten pelvic floor and abdominals and hold. Alternately lift arm to shoulder level. Repeat 10___ times. Do __2_ times a day.   Copyright  VHI. All rights reserved.  Bracing With Leg Raise (Quadruped)   On hands and knees find neutral spine. Tighten pelvic floor and abdominals and hold. Alternating legs, straighten and lift to hip level. Repeat __10_ times. Do _2__ times a day.   Copyright  VHI. All rights reserved.  Bracing With Arm / Leg Raise (Quadruped)   On hands and knees find neutral spine. Tighten pelvic floor and abdominals and hold. Alternating, lift arm to shoulder level and opposite leg to hip level. Repeat 10 ___ times. Do _2__ times a day.   Copyright  VHI. All rights reserved.

## 2014-12-19 NOTE — Therapy (Signed)
[place], [place], [address] Phone: [phone]   Fax:  [phone]  Physical Therapy Treatment  Patient Details  Name: [name] MRN: [id number] Date of Birth: 11-Mar-1946 Referring Provider:  Virginia Crews, MD  Encounter Date: 12/19/2014      PT End of Session - 12/19/14 1154    Visit Number 6   Number of Visits 16   Date for PT Re-Evaluation 01/05/15   Authorization Type Medicare: add KX modifier by 15th visit.   PT Start Time 1147   PT Stop Time 1225   PT Time Calculation (min) 38 min      Past Medical History  Diagnosis Date  . Diabetes mellitus   . GERD (gastroesophageal reflux disease)   . Hyperlipidemia   . Hypertension   . 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 Arthritis     osteoarthritis  . Back pain, chronic     per pt disc disease, disc herniation- Followed by Dr. Cyndy Freeze  . Carpal tunnel syndrome on both sides     Followed by Dr. Burney Gauze  . Glaucoma   . Trigger finger   . Polio osteopathy of lower leg 1948  . DDD (degenerative disc disease)     Lumbar Spondylosis  . Abnormal Pap smear of cervix 1970    Cryosurgery done  . Vitamin D deficiency     replaced 2010    Past Surgical History  Procedure Laterality Date  . Cholecystectomy    . Hand surgery      Right wrist, s/p plate removal in Feb 2012  . Abdominal hysterectomy      partial  . Joint replacement  2006    left knee    There were no vitals filed for this visit.  Visit Diagnosis:  Right leg weakness  Abnormality of gait  Abnormal posture  Leg length discrepancy  Bilateral low back pain without sciatica      Subjective Assessment - 12/19/14 1153    Subjective a little pain in my left ankle this morning. I did alot of walking saturday at the mall            [facility] PT Assessment - 12/19/14 1209    Observation/Other Assessments   Focus on Therapeutic  Outcomes (FOTO)  40% limitation improved from 47%   Strength   Right Hip Flexion 2/5   Right Hip Extension 3/5   Right Hip ABduction 3/5   Right Hip ADduction 2/5   Left Hip Flexion 4/5   Right Knee Flexion 3/5   Right Knee Extension 3-/5   Left Knee Flexion 5/5   Left Knee Extension 5/5                     [facility] Adult PT Treatment/Exercise - 12/19/14 1201    Lumbar Exercises: Supine   Clam 10 reps   Clam Limitations left only   Bent Knee Raise 10 reps   Bent Knee Raise Limitations left only   Straight Leg Raise 10 reps   Straight Leg Raises Limitations left only with ab set   Other Supine Lumbar Exercises ball squeeze x 10   Lumbar Exercises: Prone   Other Prone Lumbar Exercises Multifidus activation x 10, then hold with hamstring curls x 10 bilateral, extensions caused pain today so dc   Lumbar Exercises: Quadruped   Single Arm Raise 10 reps   Single Arm Raise Weights (  lbs) cues for ab set and neutral spine   Straight Leg Raise 10 reps   Opposite Arm/Leg Raise Right arm/Left leg;Left arm/Right leg;5 reps                PT Education - 12/19/14 1234    Education provided Yes   Education Details quarduped single leg, arm then alternating as able   Person(s) Educated Patient   Methods Explanation;Handout   Comprehension Verbalized understanding          PT Short Term Goals - 12/16/14 1156    PT SHORT TERM GOAL #1   Title pt will be I with initial HEP (12/11/2014)   Time 4   Period Weeks   Status Achieved   PT SHORT TERM GOAL #2   Title She will demonstrate pain to <5/10 during and following standing /walking for > 15 minutes to help with walking endurance (12/11/2014)   Time 4   Period Weeks   Status Achieved   PT SHORT TERM GOAL #3   Title pt will be able to don/doff orthosis an utilize it correctly  to correct leg length descrepancy to assist with functional progression and safe functional mobility (12/11/2014)   Time 4   Period Weeks   Status  Achieved   PT SHORT TERM GOAL #4   Title She will increase FOTO score by > 10 points to help with increased functional capacity (12/11/2014)   Time 4   Period Weeks   Status Achieved           PT Long Term Goals - 12/19/14 1217    PT LONG TERM GOAL #1   Title pt will be I with all HEP givne throughout therapy at discharge (01/05/2015)   Time 8   Period Weeks   Status Achieved   PT LONG TERM GOAL #2   Title She will demonstrate increased strength to >3/5 in the R LE to help with walking/standing endurance and safety during amb with LRAD (01/05/2015)   Time 8   Period Weeks   Status Not Met  3/5 or less   PT LONG TERM GOAL #3   Title pt will be able to stand/ walk with LRAD for > 20 min with < 3/10 pain during and following activity to promote and increase community ambulation (01/05/2015)   Time 8   Period Weeks   Status Achieved   PT LONG TERM GOAL #4   Title pt will be able to verbalize and demonstrate techniques to reduce low back reinjury via postural awareness, correct utilization of DME, lifting and carrying mechanics, HEP (01/05/2015)   Time 8   Period Weeks   Status Achieved   PT LONG TERM GOAL #5   Title She will increase her FOTO score to > 39% limited to demonstrate improved functional capacity upon discharge (01/05/2015)   Time 8   Period Weeks   Status Partially Met               Plan - 12/19/14 1224    Clinical Impression Statement Pt reports today with continued reports of no pain. She has been able to ambulate in the mall for 4 hours without low back or hip pain this weekend. She has met all of her STG and LTG except #2. LTG #5 nearly met. Instructred pt in quadruped core strengthening with good tolerance and no pain. Issued for HEP and advised to stop if painful.    PT Next Visit Plan discharge today  G-Codes - 12/19/14 1501    Functional Assessment Tool Used FOTO 40% limited   Functional Limitation Mobility: Walking and moving around    Mobility: Walking and Moving Around Goal Status [phone]) At least 20 percent but less than 40 percent impaired, limited or restricted   Mobility: Walking and Moving Around Discharge Status [phone]) At least 20 percent but less than 40 percent impaired, limited or restricted      Problem List Patient Active Problem List   Diagnosis Date Noted  . GERD (gastroesophageal reflux disease) 10/26/2014  . Healthcare maintenance 05/31/2014  . T2DM (type 2 diabetes mellitus) 05/31/2014  . 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 Viral URI with cough 06/08/2013  . Flank pain 06/19/2012  . Obesity (BMI 30-39.9) 03/24/2012  . Biceps tendon tear 08/10/2010  . Shoulder pain 08/10/2010  . HTN (hypertension) 08/03/2010  . Hyperlipidemia 08/03/2010  . Glaucoma 08/03/2010  . Radicular low back pain 08/03/2010    Starr Lake, PTA 12/19/2014, 3:02 PM  [facility] [address] [place], [place], [address] Phone: [phone]   Fax:  [phone]            PHYSICAL THERAPY DISCHARGE SUMMARY  Visits from Start of Care: 6  Current functional level related to goals / functional outcomes: FOTO 40% limited   Remaining deficits: Weakness of bil LE   Education / Equipment: HEP handout  Plan: Patient agrees to discharge.  Patient goals were partially met. Patient is being discharged due to being pleased with the current functional level.  ?????           Kristoffer Leamon PT, DPT, LAT, ATC  12/19/2014  3:02 PM

## 2014-12-20 ENCOUNTER — Ambulatory Visit (INDEPENDENT_AMBULATORY_CARE_PROVIDER_SITE_OTHER): Payer: Medicare Other | Admitting: Family Medicine

## 2014-12-20 VITALS — BP 144/71 | HR 74 | Temp 99.4°F | Ht 62.0 in | Wt 198.5 lb

## 2014-12-20 DIAGNOSIS — Z23 Encounter for immunization: Secondary | ICD-10-CM

## 2014-12-20 DIAGNOSIS — M5410 Radiculopathy, site unspecified: Secondary | ICD-10-CM

## 2014-12-20 NOTE — Patient Instructions (Addendum)
Nice to see you again today.  I'm glad that the pain has improved with physical therapy.  Continue to use baclofen as needed.    I will see you back in January for diabetes, blood pressure, and cholesterol.  Take care, Dr. Jacinto Reap

## 2014-12-20 NOTE — Assessment & Plan Note (Signed)
Back and leg pain resolved s/p PT Discontinue naproxen Continue baclofen 5 mg 3 times a day when necessary Follow-up as needed Referral to podiatry for special footwear to wear with her braces for polio

## 2014-12-20 NOTE — Progress Notes (Addendum)
   Subjective:   [name] is a 69 y.o. female with a history of polio osteopathy, T2 DM with peripheral neuropathy, HTN, HLD here for follow-up of hip pain.  Right leg pain: - Seen in ED 10/20/14 for neck pain, diagnosis of sciatica - Seen in follow-up at [facility] 10/26/14 referred to physical therapy and started baclofen 3 times a day when necessary - Pain initially started in 09/2014 and had staring injection lumbar spine at that time with Ortho - Patient reports the pain is much improved with physical therapy - was discharged yesterday for meeting all of her goals - taking baclofen prn - and it is helping too - ran out of NSAID 1 wk ago, but is still doing well - back pain and sciatica now improved  Review of Systems:  Per HPI. All other systems reviewed and are negative.   PMH, PSH, Medications, Allergies, and FmHx reviewed and updated in EMR.  Social History: never smoker  Objective:  BP 144/71 mmHg  Pulse 74  Temp(Src) 99.4 F (37.4 C) (Oral)  Ht 5\' 2"  (1.575 m)  Wt 198 lb 8 oz (90.039 kg)  BMI 36.30 kg/m2  Gen:  69 y.o. female in NAD HEENT: NCAT, MMM CV: RRR, no MRG Resp: Non-labored, CTAB, no wheezes noted Abd: Soft, NTND, BS present, no guarding or organomegaly Ext: WWP, no edema MSK: Full ROM, no TTP over back or leg, weakness in R leg chronic 2/2 polio noted and unchanged Neuro: Alert and oriented, speech normal   Assessment & Plan:     [name] is a 69 y.o. female here for R leg pain f/u.  Radicular low back pain Back and leg pain resolved s/p PT Discontinue naproxen Continue baclofen 5 mg 3 times a day when necessary Follow-up as needed Referral to podiatry for special footwear to wear with her braces for polio    Virginia Crews, MD MPH PGY-2,  [facility] 12/20/2014  9:25 AM

## 2014-12-22 ENCOUNTER — Encounter: Payer: Medicare Other | Admitting: Physical Therapy

## 2014-12-26 ENCOUNTER — Encounter: Payer: Medicare Other | Admitting: Physical Therapy

## 2014-12-29 ENCOUNTER — Encounter: Payer: Medicare Other | Admitting: Physical Therapy

## 2015-01-06 ENCOUNTER — Other Ambulatory Visit: Payer: Self-pay | Admitting: Family Medicine

## 2015-01-18 ENCOUNTER — Encounter: Payer: Self-pay | Admitting: Podiatry

## 2015-01-18 ENCOUNTER — Ambulatory Visit (INDEPENDENT_AMBULATORY_CARE_PROVIDER_SITE_OTHER): Payer: Medicare Other | Admitting: Podiatry

## 2015-01-18 DIAGNOSIS — B91 Sequelae of poliomyelitis: Secondary | ICD-10-CM | POA: Diagnosis not present

## 2015-01-18 DIAGNOSIS — M6281 Muscle weakness (generalized): Principal | ICD-10-CM

## 2015-01-18 DIAGNOSIS — M21769 Unequal limb length (acquired), unspecified tibia and fibula: Secondary | ICD-10-CM | POA: Diagnosis not present

## 2015-01-18 DIAGNOSIS — E119 Type 2 diabetes mellitus without complications: Secondary | ICD-10-CM | POA: Diagnosis not present

## 2015-01-18 DIAGNOSIS — M2040 Other hammer toe(s) (acquired), unspecified foot: Secondary | ICD-10-CM

## 2015-01-18 NOTE — Progress Notes (Signed)
   Subjective:    Patient ID: [name], female    DOB: December 03, 1945, 69 y.o.   MRN: [id number]  HPI  Diabetic foot check and diabetic shoes.  This patient presents today requesting a foot examination for diabetes and is requesting replacement diabetic shoes. He says that in the past the has had diabetic shoes and is currently wearing AFO on the right that was prescribed by Dr. Blenda Mounts. He has a history of polio which requires an AFO. He has a history of asymmetrical foot sizes and leg length as well as muscular weakness on the right lower extremity associated with post polio syndrome. He states that his foot and leg problems are stable over multiple years  He denies any history of foot ulceration, claudication or amputation   Review of Systems  Eyes: Positive for visual disturbance.  Musculoskeletal: Positive for myalgias, back pain and gait problem.  Neurological: Positive for weakness.  Hematological: Bruises/bleeds easily.       Objective:   Physical Exam  Orientated 3  Vascular: No peripheral edema noted bilaterally DP and PT pulses 2/4 bilaterally Capillary reflex immediate bilaterally  Neurological: Sensation to 10 g monofilament wire intact 5/5 bilaterally Ankle reflex reactive bilaterally Vibratory sensation reactive bilaterally  Dermatological: No skin lesions noted bilaterally No open skin lesions noted bilaterally  Musculoskeletal: Atrophy of right calf Left foot appears larger than right Limb length inequality right shorter than left Hammertoe deformities 2-4 bilaterally Week dorsi flexors right leg      Assessment & Plan:   Assessment: Diabetic without complication Post polio syndrome Muscular weakness right associated with polio Asymmetrical foot sizes left larger than right Limb length inequality right shorter than left, 1/2-1 inch plus  Plan: Review the results examination with patient today Patient referred to [facility] for diabetic  shoes Rx  half-inch full heel sole right Shoe size require different shoe size for the right and left shoes (shoe size 9 right and shoe size 10 left) Patient has existing AFO in good shape will continue wearing a right lower extremity  Reappoint at patient's request or yearly

## 2015-01-18 NOTE — Patient Instructions (Addendum)
I am referring you to [employer] for diabetic shoes to accommodate the leg length inequality and the differences in foot sizes right and left Your diabetic foot examination demonstrated adequate pulsation an adequate feeling Continue wearing existing brace on the right leg with your replacement shoes Return as needed or yearly Diabetes and Foot Care Diabetes may cause you to have problems because of poor blood supply (circulation) to your feet and legs. This may cause the skin on your feet to become thinner, break easier, and heal more slowly. Your skin may become dry, and the skin may peel and crack. You may also have nerve damage in your legs and feet causing decreased feeling in them. You may not notice minor injuries to your feet that could lead to infections or more serious problems. Taking care of your feet is one of the most important things you can do for yourself.  HOME CARE INSTRUCTIONS  Wear shoes at all times, even in the house. Do not go barefoot. Bare feet are easily injured.  Check your feet daily for blisters, cuts, and redness. If you cannot see the bottom of your feet, use a mirror or ask someone for help.  Wash your feet with warm water (do not use hot water) and mild soap. Then pat your feet and the areas between your toes until they are completely dry. Do not soak your feet as this can dry your skin.  Apply a moisturizing lotion or petroleum jelly (that does not contain alcohol and is unscented) to the skin on your feet and to dry, brittle toenails. Do not apply lotion between your toes.  Trim your toenails straight across. Do not dig under them or around the cuticle. File the edges of your nails with an emery board or nail file.  Do not cut corns or calluses or try to remove them with medicine.  Wear clean socks or stockings every day. Make sure they are not too tight. Do not wear knee-high stockings since they may decrease blood flow to your legs.  Wear shoes that fit  properly and have enough cushioning. To break in new shoes, wear them for just a few hours a day. This prevents you from injuring your feet. Always look in your shoes before you put them on to be sure there are no objects inside.  Do not cross your legs. This may decrease the blood flow to your feet.  If you find a minor scrape, cut, or break in the skin on your feet, keep it and the skin around it clean and dry. These areas may be cleansed with mild soap and water. Do not cleanse the area with peroxide, alcohol, or iodine.  When you remove an adhesive bandage, be sure not to damage the skin around it.  If you have a wound, look at it several times a day to make sure it is healing.  Do not use heating pads or hot water bottles. They may burn your skin. If you have lost feeling in your feet or legs, you may not know it is happening until it is too late.  Make sure your health care provider performs a complete foot exam at least annually or more often if you have foot problems. Report any cuts, sores, or bruises to your health care provider immediately. SEEK MEDICAL CARE IF:   You have an injury that is not healing.  You have cuts or breaks in the skin.  You have an ingrown nail.  You notice redness  on your legs or feet.  You feel burning or tingling in your legs or feet.  You have pain or cramps in your legs and feet.  Your legs or feet are numb.  Your feet always feel cold. SEEK IMMEDIATE MEDICAL CARE IF:   There is increasing redness, swelling, or pain in or around a wound.  There is a red line that goes up your leg.  Pus is coming from a wound.  You develop a fever or as directed by your health care provider.  You notice a bad smell coming from an ulcer or wound.   This information is not intended to replace advice given to you by your health care provider. Make sure you discuss any questions you have with your health care provider.   Document Released: 03/22/2000  Document Revised: 11/25/2012 Document Reviewed: 09/01/2012 Elsevier Interactive Patient Education [employer].

## 2015-01-24 DIAGNOSIS — M9983 Other biomechanical lesions of lumbar region: Secondary | ICD-10-CM | POA: Diagnosis not present

## 2015-01-24 DIAGNOSIS — M545 Low back pain: Secondary | ICD-10-CM | POA: Diagnosis not present

## 2015-01-24 DIAGNOSIS — M5416 Radiculopathy, lumbar region: Secondary | ICD-10-CM | POA: Diagnosis not present

## 2015-01-24 DIAGNOSIS — M5126 Other intervertebral disc displacement, lumbar region: Secondary | ICD-10-CM | POA: Diagnosis not present

## 2015-02-13 ENCOUNTER — Other Ambulatory Visit: Payer: Self-pay | Admitting: Family Medicine

## 2015-02-15 ENCOUNTER — Ambulatory Visit (INDEPENDENT_AMBULATORY_CARE_PROVIDER_SITE_OTHER): Payer: Medicare Other | Admitting: Family Medicine

## 2015-02-15 ENCOUNTER — Encounter: Payer: Self-pay | Admitting: Family Medicine

## 2015-02-15 VITALS — BP 135/63 | HR 69 | Temp 98.3°F | Ht 62.0 in | Wt 200.0 lb

## 2015-02-15 DIAGNOSIS — E785 Hyperlipidemia, unspecified: Secondary | ICD-10-CM | POA: Diagnosis not present

## 2015-02-15 DIAGNOSIS — I10 Essential (primary) hypertension: Secondary | ICD-10-CM | POA: Diagnosis not present

## 2015-02-15 DIAGNOSIS — E1121 Type 2 diabetes mellitus with diabetic nephropathy: Secondary | ICD-10-CM | POA: Diagnosis not present

## 2015-02-15 DIAGNOSIS — E119 Type 2 diabetes mellitus without complications: Secondary | ICD-10-CM | POA: Diagnosis not present

## 2015-02-15 LAB — LIPID PANEL
CHOLESTEROL: 113 mg/dL — AB (ref 125–200)
HDL: 58 mg/dL (ref >=46)
LDL Cholesterol: 42 mg/dL (ref <130)
TRIGLYCERIDES: 66 mg/dL (ref <150)
Total CHOL/HDL Ratio: 1.9 Ratio (ref <=5.0)
VLDL: 13 mg/dL (ref <30)

## 2015-02-15 LAB — POCT GLYCOSYLATED HEMOGLOBIN (HGB A1C): Hemoglobin A1C: 6.4

## 2015-02-15 NOTE — Assessment & Plan Note (Signed)
Repeat lipid panel today Continue Lipitor 40 mg daily

## 2015-02-15 NOTE — Assessment & Plan Note (Signed)
Well-controlled Continue HCTZ 25 mg daily and ramipril 10 mg daily Bmet at next visit Follow-up in 6 months

## 2015-02-15 NOTE — Assessment & Plan Note (Signed)
Well-controlled with A1c of 6.4 Counseled on diet and exercise Continue metformin 500 mg twice a day  form completed for diabetic shoes Continue to follow podiatry and ophthalmology Follow-up in 6 months

## 2015-02-15 NOTE — Progress Notes (Signed)
   Subjective:   [name] is a 69 y.o. female with a history of T2DM with peripheral neuropathy, HLD, HTN here for f/u of chronic issues  HTN: - Medications: HCTZ 25mg  daily, ramipril 10mg  daily - Compliance: good - Checking BP at home: no - Denies any SOB, CP, vision changes, LE edema, medication SEs, or symptoms of hypotension - diet: "I don't eat like I should", only had grits for b-fast yesterday, eats vegetables, tries to stay away from too many carbs, but does love corn - exercise: goes to [facility] with daughter MWF  T2DM with peripheral neuropathy - Checking BG at home: checks about 2x/day, last night it was 78 (people scared her and told her this was low), 116 this AM - Medications: Metformin 500mg  BID - Compliance: good - foot exam: 01/18/15, needs form filled out for diabetic shoes, already has Rx from podiatry - eye exam:  Scheduled for 03/07/15 - neuropathy: taking gabapentin 800mg /1200mg /1200mg  - not having any pain  HLD - medications: Lipitor 40mg  daily, ASA 81mg  daily - compliance: good - medication SEs: none  Review of Systems: Per HPI.    PMH, PSH, Medications, Allergies, and FmHx reviewed and updated in EMR.  Social History: never smoker  Objective:  BP 135/63 mmHg  Pulse 69  Temp(Src) 98.3 F (36.8 C) (Oral)  Ht 5\' 2"  (1.575 m)  Wt 200 lb (90.719 kg)  BMI 36.57 kg/m2  Gen:  69 y.o. female in NAD HEENT: NCAT, MMM, EOMI, PERRL, anicteric sclerae, OP clear CV: RRR, no MRG, intact distal pulses Resp: Non-labored, CTAB, no wheezes noted Ext: WWP, no edema MSK: moves all extremities, walks with cane Neuro: Alert and oriented, speech normal      Chemistry      Component Value Date/Time   NA 142 10/26/2014 0905   NA 139 04/26/2010   K 4.1 10/26/2014 0905   CL 105 10/26/2014 0905   CO2 26 10/26/2014 0905   BUN 12 10/26/2014 0905   BUN 9 04/26/2010   CREATININE 0.66 10/26/2014 0905   CREATININE 0.74 04/26/2010 2000   CREATININE 0.7 04/26/2010    GLU 97 04/26/2010      Component Value Date/Time   CALCIUM 9.4 10/26/2014 0905   ALKPHOS 72 02/27/2011 0942   AST 12 02/27/2011 0942   ALT 9 02/27/2011 0942   BILITOT 0.5 02/27/2011 0942      Lipid Panel     Component Value Date/Time   CHOL 116 12/23/2013 1030   TRIG 73 12/23/2013 1030   HDL 52 12/23/2013 1030   CHOLHDL 2.2 12/23/2013 1030   VLDL 15 12/23/2013 1030   LDLCALC 49 12/23/2013 1030    Lab Results  Component Value Date   HGBA1C 6.4 02/15/2015   Assessment & Plan:     Brinsley Wence is a 69 y.o. female here for f/u of T2DM, HTN, HLD  HTN (hypertension)  Well-controlled Continue HCTZ 25 mg daily and ramipril 10 mg daily Bmet at next visit Follow-up in 6 months  T2DM (type 2 diabetes mellitus)  Well-controlled with A1c of 6.4 Counseled on diet and exercise Continue metformin 500 mg twice a day  form completed for diabetic shoes Continue to follow podiatry and ophthalmology Follow-up in 6 months  Hyperlipidemia  Repeat lipid panel today Continue Lipitor 40 mg daily    Virginia Crews, MD MPH PGY-2,  [facility] 02/15/2015  1:27 PM

## 2015-02-15 NOTE — Patient Instructions (Signed)
Nice to see you again today. Your blood pressure and diabetes are well controlled. Keep taking all of your same medications. I'll see you back in 6 months for these medical problems.  We are getting some labs today and someone will call you or send you a letter with the results when they're available.  Take care, Dr. Jacinto Reap

## 2015-02-16 ENCOUNTER — Encounter: Payer: Self-pay | Admitting: Family Medicine

## 2015-02-20 DIAGNOSIS — M5126 Other intervertebral disc displacement, lumbar region: Secondary | ICD-10-CM | POA: Diagnosis not present

## 2015-02-20 DIAGNOSIS — M5416 Radiculopathy, lumbar region: Secondary | ICD-10-CM | POA: Diagnosis not present

## 2015-02-21 ENCOUNTER — Telehealth: Payer: Self-pay | Admitting: Family Medicine

## 2015-02-21 NOTE — Telephone Encounter (Signed)
Patient dropped off form to be filled out for diabetic shoes.  Please fax when completed. °

## 2015-02-22 NOTE — Telephone Encounter (Signed)
Also needs form #3 completed as well.

## 2015-02-22 NOTE — Telephone Encounter (Signed)
Needs PCP to complete Form #2, placed in PCP box.

## 2015-02-23 NOTE — Telephone Encounter (Signed)
Unable to complete form.  Podiatrist completed foot exam and prescribed diabetic shoes.  Please instruct patient to have Podiatrist complete form.  Virginia Crews, MD, MPH PGY-2,  [facility] 02/23/2015 2:04 PM

## 2015-02-27 NOTE — Telephone Encounter (Signed)
Statement of certifying physician form faxed to [employer] office [phone]) stating that patient has DM and needs special shoes/inserts.  ICD-10 code--E11.40  Burna Forts, BSN, RN-BC

## 2015-03-07 DIAGNOSIS — H25013 Cortical age-related cataract, bilateral: Secondary | ICD-10-CM | POA: Diagnosis not present

## 2015-03-07 DIAGNOSIS — H401112 Primary open-angle glaucoma, right eye, moderate stage: Secondary | ICD-10-CM | POA: Diagnosis not present

## 2015-03-07 DIAGNOSIS — H401122 Primary open-angle glaucoma, left eye, moderate stage: Secondary | ICD-10-CM | POA: Diagnosis not present

## 2015-03-07 DIAGNOSIS — H2513 Age-related nuclear cataract, bilateral: Secondary | ICD-10-CM | POA: Diagnosis not present

## 2015-03-13 ENCOUNTER — Telehealth: Payer: Self-pay | Admitting: Family Medicine

## 2015-03-13 ENCOUNTER — Other Ambulatory Visit: Payer: Self-pay | Admitting: Family Medicine

## 2015-03-13 NOTE — Telephone Encounter (Signed)
Pt is calling to check the status of the forms the doctor filled out. She said that the doctor filled one of the forms but forgot the other form. Please call to discuss. jw

## 2015-03-16 NOTE — Telephone Encounter (Signed)
Patient called, requesting the forms be mailed to her home, since she has no transportation at this time. Forms placed in the to be mailed area to go out tomorrow.

## 2015-03-16 NOTE — Telephone Encounter (Signed)
Patient informed that her foot doctor needs to fill out sections 2 & 3 of her form. Form placed up front for patient to pick up.

## 2015-03-20 DIAGNOSIS — M9983 Other biomechanical lesions of lumbar region: Secondary | ICD-10-CM | POA: Diagnosis not present

## 2015-03-20 DIAGNOSIS — M5126 Other intervertebral disc displacement, lumbar region: Secondary | ICD-10-CM | POA: Diagnosis not present

## 2015-03-20 DIAGNOSIS — M5416 Radiculopathy, lumbar region: Secondary | ICD-10-CM | POA: Diagnosis not present

## 2015-03-20 DIAGNOSIS — E1140 Type 2 diabetes mellitus with diabetic neuropathy, unspecified: Secondary | ICD-10-CM | POA: Diagnosis not present

## 2015-03-20 DIAGNOSIS — Z6836 Body mass index (BMI) 36.0-36.9, adult: Secondary | ICD-10-CM | POA: Diagnosis not present

## 2015-03-20 DIAGNOSIS — M545 Low back pain: Secondary | ICD-10-CM | POA: Diagnosis not present

## 2015-03-29 DIAGNOSIS — Z1231 Encounter for screening mammogram for malignant neoplasm of breast: Secondary | ICD-10-CM | POA: Diagnosis not present

## 2015-04-04 ENCOUNTER — Other Ambulatory Visit: Payer: Self-pay | Admitting: Family Medicine

## 2015-04-04 MED ORDER — GABAPENTIN 400 MG PO CAPS: ORAL_CAPSULE | ORAL | Status: DC

## 2015-04-04 NOTE — Telephone Encounter (Signed)
Pt needs a refill on gabapentin. Additionally, pt states that the QTY is not enough for the amount she needs to take. She takes 8 per day and for a 30 day supply she needs QTY 240. She's only been getting a qty of 65. Please contact pt when complete. Thank you, Fonda Kinder, ASA

## 2015-04-13 ENCOUNTER — Other Ambulatory Visit: Payer: Self-pay | Admitting: Family Medicine

## 2015-05-15 DIAGNOSIS — M545 Low back pain: Secondary | ICD-10-CM | POA: Diagnosis not present

## 2015-05-15 DIAGNOSIS — M5416 Radiculopathy, lumbar region: Secondary | ICD-10-CM | POA: Diagnosis not present

## 2015-05-15 DIAGNOSIS — M5126 Other intervertebral disc displacement, lumbar region: Secondary | ICD-10-CM | POA: Diagnosis not present

## 2015-05-15 DIAGNOSIS — M9983 Other biomechanical lesions of lumbar region: Secondary | ICD-10-CM | POA: Diagnosis not present

## 2015-05-24 DIAGNOSIS — E1142 Type 2 diabetes mellitus with diabetic polyneuropathy: Secondary | ICD-10-CM | POA: Diagnosis not present

## 2015-06-08 ENCOUNTER — Other Ambulatory Visit: Payer: Self-pay | Admitting: Family Medicine

## 2015-06-20 DIAGNOSIS — M5416 Radiculopathy, lumbar region: Secondary | ICD-10-CM | POA: Diagnosis not present

## 2015-06-20 DIAGNOSIS — M5126 Other intervertebral disc displacement, lumbar region: Secondary | ICD-10-CM | POA: Diagnosis not present

## 2015-06-20 DIAGNOSIS — M545 Low back pain: Secondary | ICD-10-CM | POA: Diagnosis not present

## 2015-07-03 DIAGNOSIS — E1140 Type 2 diabetes mellitus with diabetic neuropathy, unspecified: Secondary | ICD-10-CM | POA: Diagnosis not present

## 2015-07-11 DIAGNOSIS — M5416 Radiculopathy, lumbar region: Secondary | ICD-10-CM | POA: Diagnosis not present

## 2015-07-11 DIAGNOSIS — M5126 Other intervertebral disc displacement, lumbar region: Secondary | ICD-10-CM | POA: Diagnosis not present

## 2015-07-11 DIAGNOSIS — M9983 Other biomechanical lesions of lumbar region: Secondary | ICD-10-CM | POA: Diagnosis not present

## 2015-07-11 DIAGNOSIS — M545 Low back pain: Secondary | ICD-10-CM | POA: Diagnosis not present

## 2015-07-17 ENCOUNTER — Other Ambulatory Visit: Payer: Self-pay | Admitting: Family Medicine

## 2015-07-29 ENCOUNTER — Emergency Department (HOSPITAL_COMMUNITY)
Admission: EM | Admit: 2015-07-29 | Discharge: 2015-07-29 | Disposition: A | Discharge status: Home / Self Care | Payer: Medicare Other | Attending: Emergency Medicine | Admitting: Emergency Medicine

## 2015-07-29 ENCOUNTER — Emergency Department (HOSPITAL_COMMUNITY): Payer: Medicare Other

## 2015-07-29 ENCOUNTER — Encounter (HOSPITAL_COMMUNITY): Payer: Self-pay | Admitting: Family Medicine

## 2015-07-29 DIAGNOSIS — Z79899 Other long term (current) drug therapy: Secondary | ICD-10-CM | POA: Diagnosis not present

## 2015-07-29 DIAGNOSIS — H409 Unspecified glaucoma: Secondary | ICD-10-CM | POA: Diagnosis not present

## 2015-07-29 DIAGNOSIS — E559 Vitamin D deficiency, unspecified: Secondary | ICD-10-CM | POA: Insufficient documentation

## 2015-07-29 DIAGNOSIS — M791 Myalgia: Secondary | ICD-10-CM | POA: Diagnosis not present

## 2015-07-29 DIAGNOSIS — Z7982 Long term (current) use of aspirin: Secondary | ICD-10-CM | POA: Insufficient documentation

## 2015-07-29 DIAGNOSIS — M1990 Unspecified osteoarthritis, unspecified site: Secondary | ICD-10-CM | POA: Insufficient documentation

## 2015-07-29 DIAGNOSIS — K219 Gastro-esophageal reflux disease without esophagitis: Secondary | ICD-10-CM | POA: Insufficient documentation

## 2015-07-29 DIAGNOSIS — I10 Essential (primary) hypertension: Secondary | ICD-10-CM | POA: Insufficient documentation

## 2015-07-29 DIAGNOSIS — G8929 Other chronic pain: Secondary | ICD-10-CM | POA: Insufficient documentation

## 2015-07-29 DIAGNOSIS — Z791 Long term (current) use of non-steroidal anti-inflammatories (NSAID): Secondary | ICD-10-CM | POA: Insufficient documentation

## 2015-07-29 DIAGNOSIS — E119 Type 2 diabetes mellitus without complications: Secondary | ICD-10-CM | POA: Insufficient documentation

## 2015-07-29 DIAGNOSIS — Z7984 Long term (current) use of oral hypoglycemic drugs: Secondary | ICD-10-CM | POA: Insufficient documentation

## 2015-07-29 DIAGNOSIS — Z8612 Personal history of poliomyelitis: Secondary | ICD-10-CM | POA: Diagnosis not present

## 2015-07-29 DIAGNOSIS — K5730 Diverticulosis of large intestine without perforation or abscess without bleeding: Secondary | ICD-10-CM | POA: Diagnosis not present

## 2015-07-29 DIAGNOSIS — E785 Hyperlipidemia, unspecified: Secondary | ICD-10-CM | POA: Diagnosis not present

## 2015-07-29 DIAGNOSIS — M25552 Pain in left hip: Secondary | ICD-10-CM | POA: Diagnosis not present

## 2015-07-29 DIAGNOSIS — R1030 Lower abdominal pain, unspecified: Secondary | ICD-10-CM | POA: Diagnosis not present

## 2015-07-29 DIAGNOSIS — K6289 Other specified diseases of anus and rectum: Secondary | ICD-10-CM | POA: Diagnosis present

## 2015-07-29 MED ORDER — IBUPROFEN 400 MG PO TABS: 400.0000 mg | ORAL_TABLET | Freq: Three times a day (TID) | ORAL | Status: DC

## 2015-07-29 NOTE — Discharge Instructions (Signed)
If your pain continues, call and make an appointment to follow-up with an orthopedist. Otherwise follow-up with your primary physician.   Musculoskeletal Pain Musculoskeletal pain is muscle and boney aches and pains. These pains can occur in any part of the body. Your caregiver may treat you without knowing the cause of the pain. They may treat you if blood or urine tests, X-rays, and other tests were normal.  CAUSES There is often not a definite cause or reason for these pains. These pains may be caused by a type of germ (virus). The discomfort may also come from overuse. Overuse includes working out too hard when your body is not fit. Boney aches also come from weather changes. Bone is sensitive to atmospheric pressure changes. HOME CARE INSTRUCTIONS   Ask when your test results will be ready. Make sure you get your test results.  Only take over-the-counter or prescription medicines for pain, discomfort, or fever as directed by your caregiver. If you were given medications for your condition, do not drive, operate machinery or power tools, or sign legal documents for 24 hours. Do not drink alcohol. Do not take sleeping pills or other medications that may interfere with treatment.  Continue all activities unless the activities cause more pain. When the pain lessens, slowly resume normal activities. Gradually increase the intensity and duration of the activities or exercise.  During periods of severe pain, bed rest may be helpful. Lay or sit in any position that is comfortable.  Putting ice on the injured area.  Put ice in a bag.  Place a towel between your skin and the bag.  Leave the ice on for 15 to 20 minutes, 3 to 4 times a day.  Follow up with your caregiver for continued problems and no reason can be found for the pain. If the pain becomes worse or does not go away, it may be necessary to repeat tests or do additional testing. Your caregiver may need to look further for a possible  cause. SEEK IMMEDIATE MEDICAL CARE IF:  You have pain that is getting worse and is not relieved by medications.  You develop chest pain that is associated with shortness or breath, sweating, feeling sick to your stomach (nauseous), or throw up (vomit).  Your pain becomes localized to the abdomen.  You develop any new symptoms that seem different or that concern you. MAKE SURE YOU:   Understand these instructions.  Will watch your condition.  Will get help right away if you are not doing well or get worse.   This information is not intended to replace advice given to you by your health care provider. Make sure you discuss any questions you have with your health care provider.   Document Released: 03/25/2005 Document Revised: 06/17/2011 Document Reviewed: 11/27/2012 Elsevier Interactive Patient Education [employer].

## 2015-07-29 NOTE — ED Notes (Signed)
Pt here for 2 days of rectal pain. Denies any hemrrhoids or bleeding.

## 2015-07-29 NOTE — ED Notes (Signed)
Pt is in stable condition upon d/c and is escorted from ED via wheelchair. 

## 2015-07-29 NOTE — ED Provider Notes (Signed)
CSN: [id number]     Arrival date & time 07/29/15  1444 History   First MD Initiated Contact with Patient 07/29/15 1620     Chief Complaint  Patient presents with  . Rectal Pain     (Consider location/radiation/quality/duration/timing/severity/associated sxs/prior Treatment) HPI Patient presents with 2 half weeks of left buttock pain. Denies any known trauma. States the pain is constant and worse when sitting. Denies any pain with bowel movements, rectal bleeding or hemorrhoids. No previously similar pain. States she has no pain in the left lower extremity or numbness. Past Medical History  Diagnosis Date  . Diabetes mellitus   . GERD (gastroesophageal reflux disease)   . Hyperlipidemia   . Hypertension   . 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 Arthritis     osteoarthritis  . Back pain, chronic     per pt disc disease, disc herniation- Followed by Dr. Cyndy Freeze  . Carpal tunnel syndrome on both sides     Followed by Dr. Burney Gauze  . Glaucoma   . Trigger finger   . 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 DDD (degenerative disc disease)     Lumbar Spondylosis  . Abnormal Pap smear of cervix 1970    Cryosurgery done  . Vitamin D deficiency     replaced 2010   Past Surgical History  Procedure Laterality Date  . Cholecystectomy    . Hand surgery      Right wrist, s/p plate removal in Feb 2012  . Abdominal hysterectomy      partial  . Joint replacement  2006    left knee   Family History  Problem Relation Age of Onset  . Heart disease Father   . Heart disease Sister   . Hyperlipidemia Sister   . Hypertension Sister   . Kidney disease Brother   . Heart disease Mother    Social History  Substance Use Topics  . Smoking status: Never Smoker   . Smokeless tobacco: Never Used  . Alcohol Use: No   OB History    No data available     Review of Systems  Constitutional: Negative for fever and chills.  Cardiovascular: Negative for  leg swelling.  Gastrointestinal: Negative for nausea, vomiting, abdominal pain, blood in stool, anal bleeding and rectal pain.  Musculoskeletal: Positive for myalgias. Negative for back pain.  Skin: Negative for rash and wound.  Neurological: Negative for dizziness, weakness, numbness and headaches.  All other systems reviewed and are negative.     Allergies  Review of patient's allergies indicates no known allergies.  Home Medications   Prior to Admission medications   Medication Sig Start Date End Date Taking? Authorizing Provider  aspirin 81 MG tablet Take 1 tablet (81 mg total) by mouth daily. Patient taking differently: Take 81 mg by mouth daily after supper.  09/30/11  Yes Deneise Lever, MD  atorvastatin (LIPITOR) 40 MG tablet TAKE 1 TABLET (40 MG TOTAL) BY MOUTH DAILY. Patient taking differently: TAKE 1 TABLET (40 MG TOTAL) BY MOUTH DAILY AFTER SUPPER 06/08/15  Yes Virginia Crews, MD  baclofen (LIORESAL) 10 MG tablet TAKE 0.5 TABLETS (5 MG TOTAL) BY MOUTH 3 (THREE) TIMES DAILY AS NEEDED FOR MUSCLE SPASMS. 04/13/15  Yes Virginia Crews, MD  bimatoprost (LUMIGAN) 0.01 % SOLN Place 1 drop into both eyes at bedtime.   Yes Historical Provider, MD  brimonidine-timolol (COMBIGAN) 0.2-0.5 % ophthalmic solution Place 1 drop  into both eyes every 12 (twelve) hours. 1 drop each eye every 12 hours Patient taking differently: Place 1 drop into both eyes every 12 (twelve) hours.  09/30/11  Yes Deneise Lever, MD  calcium carbonate (OS-CAL) 600 MG TABS Take 1 tablet (600 mg total) by mouth daily. 09/30/11  Yes Deneise Lever, MD  cholecalciferol (VITAMIN D) 1000 UNITS tablet Take 1 tablet (1,000 Units total) by mouth daily. 10/14/13  Yes Virginia Crews, MD  dorzolamide (TRUSOPT) 2 % ophthalmic solution Place 1 drop into the right eye 2 (two) times daily.   Yes Historical Provider, MD  gabapentin (NEURONTIN) 400 MG capsule TAKE 2 CAPSULES BY MOUTH IN THE MORNING,3 CAPSULES IN THE  EVENING,AND 3 CAPSULES AT BEDTIME Patient taking differently: Take 800-1,200 mg by mouth 3 (three) times daily. Take 2 capsules (800 mg) by mouth every morning, 3 capsules (1200 mg) daily at 5pm and 3 capsules (1200 mg) at bedtime 04/04/15  Yes Virginia Crews, MD  hydrochlorothiazide (HYDRODIURIL) 25 MG tablet Take 1 tablet (25 mg total) by mouth daily. 10/26/14  Yes Virginia Crews, MD  metFORMIN (GLUCOPHAGE) 500 MG tablet Take 1 tablet (500 mg total) by mouth 2 (two) times daily with a meal. 10/26/14  Yes Virginia Crews, MD  Multiple Vitamin (MULTIVITAMIN WITH MINERALS) TABS tablet Take 1 tablet by mouth daily.   Yes Historical Provider, MD  naproxen (NAPROSYN) 375 MG tablet TAKE 1 TABLET (375 MG TOTAL) BY MOUTH 2 (TWO) TIMES DAILY. 07/18/15  Yes Virginia Crews, MD  omeprazole (PRILOSEC) 20 MG capsule Take 1 capsule (20 mg total) by mouth 2 (two) times daily. 10/26/14  Yes Virginia Crews, MD  Oxycodone HCl 10 MG TABS Take 10 mg by mouth every 6 (six) hours as needed. 07/19/15  Yes Historical Provider, MD  ramipril (ALTACE) 10 MG capsule Take 1 capsule (10 mg total) by mouth daily. 10/26/14  Yes Virginia Crews, MD  ibuprofen (ADVIL,MOTRIN) 400 MG tablet Take 1 tablet (400 mg total) by mouth 3 (three) times daily after meals. 07/29/15   Julianne Rice, MD   BP 135/77 mmHg  Pulse 77  Temp(Src) 98.1 F (36.7 C) (Oral)  Resp 20  SpO2 99% Physical Exam  Constitutional: She is oriented to person, place, and time. She appears well-developed and well-nourished. No distress.  HENT:  Head: Normocephalic and atraumatic.  Eyes: EOM are normal. Pupils are equal, round, and reactive to light.  Neck: Normal range of motion. Neck supple.  Cardiovascular: Normal rate and regular rhythm.   Pulmonary/Chest: Effort normal and breath sounds normal. No respiratory distress. She has no wheezes. She has no rales. She exhibits no tenderness.  Abdominal: Soft. Bowel sounds are normal. She  exhibits no distension and no mass. There is no tenderness. There is no rebound and no guarding.  Genitourinary:  No hemorrhoids present. No anal or rectal tenderness with palpation. No masses appreciated.  Musculoskeletal: Normal range of motion. She exhibits tenderness. She exhibits no edema.  Patient has tenderness to deep palpation in the area of the left buttock. 2+ dorsalis pedis and posterior tibial pulses bilaterally. No lower extremity swelling or pain. No midline thoracic or lumbar tenderness.  Neurological: She is alert and oriented to person, place, and time.  5/5 motor in all extremities. Sensation is fully intact.  Skin: Skin is warm and dry. No rash noted. No erythema.  Psychiatric: She has a normal mood and affect. Her behavior is normal.  Nursing note and  vitals reviewed.   ED Course  Procedures (including critical care time) Labs Review Labs Reviewed - No data to display  Imaging Review Dg Pelvis 1-2 Views  07/29/2015  CLINICAL DATA:  Left buttock pain for 2 weeks, progressive. No known injury. EXAM: PELVIS - 1-2 VIEW COMPARISON:  Pelvis and right hip radiographs 10/20/2014 FINDINGS: Well corticated irregular configuration of the right inferior pubic ramus is unchanged from prior exam. Cortical margins of the bony pelvis are otherwise intact. No fracture. Pubic symphysis and sacroiliac joints are congruent. No bony destructive change. Both femoral heads are well-seated in the respective acetabula, with minimal osteoarthritis. IMPRESSION: No acute bony abnormality. Minimal degenerative change of both hips. Electronically Signed   By: Jeb Levering M.D.   On: 07/29/2015 18:21   Ct Pelvis Wo Contrast  07/29/2015  CLINICAL DATA:  Left buttock pain for 2.5 weeks. No reported injury. Hysterectomy EXAM: CT PELVIS WITHOUT CONTRAST TECHNIQUE: Multidetector CT imaging of the pelvis was performed following the standard protocol without intravenous contrast. COMPARISON:  Pelvic  radiograph from earlier today. FINDINGS: Reproductive: Status post hysterectomy, with no abnormal findings at the vaginal cuff. No adnexal mass. Bladder: Normal. Bowel: Visualized small and large bowel are normal caliber with no bowel wall thickening. Mild to moderate diverticulosis in the sigmoid colon, with no large bowel wall thickening or pericolonic fat stranding. Vascular/Lymphatic: No pathologically enlarged lymph nodes in the pelvis. Other: No pneumoperitoneum, ascites or focal fluid collection. Musculoskeletal: No fractures or aggressive appearing focal osseous lesions. Low-attenuation lesion in the L5 vertebral body is unchanged from 11/17/2013 lumbar spine MRI, where it was characterized as a hemangioma. Severe degenerative disc disease at L4-5 and L5-S1, not appreciably changed from 11/17/2013. Moderate facet arthropathy bilaterally in the lower lumbar spine. Mild bilateral sacroiliac joint osteoarthritis. Prominent asymmetric fatty atrophy of the right-sided hip girdle musculature. IMPRESSION: 1. Severe degenerative disc disease at L4-5 and L5-S1, not appreciably changed from 11/17/2013 lumbar spine MRI. No pelvic fracture. 2. Prominent asymmetric fatty atrophy of the right hip girdle musculature, suggesting chronic denervation. 3. Mild to moderate sigmoid diverticulosis. Electronically Signed   By: Ilona Sorrel M.D.   On: 07/29/2015 20:17   I have personally reviewed and evaluated these images and lab results as part of my medical decision-making.   EKG Interpretation None      MDM   Final diagnoses:  Ischial pain, left    Pain appears to be musculoskeletal in nature. We'll assess with pelvic x-ray.  CT with evidence of spurring of the left ischial tuberosity. This is likely the cause of the patient's symptoms. We'll start on ibuprofen daily. We'll give further follow-up for possible steroid injections should symptoms continue. Return precautions given.  Julianne Rice, MD 07/29/15  2117

## 2015-08-14 ENCOUNTER — Ambulatory Visit (INDEPENDENT_AMBULATORY_CARE_PROVIDER_SITE_OTHER): Payer: Medicare Other | Admitting: Family Medicine

## 2015-08-14 ENCOUNTER — Encounter: Payer: Self-pay | Admitting: Family Medicine

## 2015-08-14 VITALS — BP 138/71 | HR 65 | Temp 98.5°F | Ht 63.0 in | Wt 200.0 lb

## 2015-08-14 DIAGNOSIS — E785 Hyperlipidemia, unspecified: Secondary | ICD-10-CM

## 2015-08-14 DIAGNOSIS — M5410 Radiculopathy, site unspecified: Secondary | ICD-10-CM | POA: Diagnosis not present

## 2015-08-14 DIAGNOSIS — K219 Gastro-esophageal reflux disease without esophagitis: Secondary | ICD-10-CM

## 2015-08-14 DIAGNOSIS — E1121 Type 2 diabetes mellitus with diabetic nephropathy: Secondary | ICD-10-CM

## 2015-08-14 DIAGNOSIS — I10 Essential (primary) hypertension: Secondary | ICD-10-CM

## 2015-08-14 LAB — POCT GLYCOSYLATED HEMOGLOBIN (HGB A1C): HEMOGLOBIN A1C: 6.6

## 2015-08-14 MED ORDER — RAMIPRIL 10 MG PO CAPS: 10.0000 mg | ORAL_CAPSULE | Freq: Every day | ORAL | Status: DC

## 2015-08-14 MED ORDER — OMEPRAZOLE 20 MG PO CPDR: 20.0000 mg | DELAYED_RELEASE_CAPSULE | Freq: Two times a day (BID) | ORAL | Status: DC

## 2015-08-14 MED ORDER — HYDROCHLOROTHIAZIDE 25 MG PO TABS: 25.0000 mg | ORAL_TABLET | Freq: Every day | ORAL | Status: DC

## 2015-08-14 NOTE — Progress Notes (Signed)
   Subjective:   [name] is a 70 y.o. female with a history of T2DM w peripheral neuropathy, HTN, HLD here for f/u of chronic issues  HTN: - Medications: HCTZ 25mg  daily, ramipril 10mg  daily - Compliance: good - Checking BP at home: no - Denies any SOB, CP, vision changes, LE edema, medication SEs, or symptoms of hypotension - diet: frustrating, avoiding fried foods and starches, eating more vegetables - exercise: goes to [facility] with daughter MWF, difficult with pain as below  T2DM with peripheral neuropathy - Checking BG at home: checks about 2x/day, 106 this AM - Medications: Metformin 500mg  BID - Compliance: good - foot exam: 01/18/15 with podiatry - eye exam: Scheduled for 09/2015 - neuropathy: taking gabapentin 800mg /1200mg /1200mg  - not having any pain, occasional tingling - denies symptoms of hypoglycemia, polyuria, polydipsia  HLD - medications: Lipitor 40mg  daily, ASA 81mg  daily - compliance: good - medication SEs: none  Spurring of L ischial tuberosity - pain is mildly better from ED visit 4/22 - hurts to sit on it - thinks she was referred to Ortho by ED, but needs PCP referral - stopped taking ibuprofen because it "made me feel bad" - now back on naproxen  Review of Systems:  Per HPI.   Social History: never smoker  Objective:  BP 138/71 mmHg  Pulse 65  Temp(Src) 98.5 F (36.9 C) (Oral)  Ht 5\' 3"  (1.6 m)  Wt 200 lb (90.719 kg)  BMI 35.44 kg/m2  SpO2 99%  Gen:  70 y.o. female in NAD HEENT: NCAT, MMM, EOMI, PERRL, anicteric sclerae CV: RRR, no MRG Resp: Non-labored, CTAB, no wheezes noted Abd: Soft, NTND, BS present, no guarding or organomegaly Ext: WWP, no edema MSK: TTP over L ischium Neuro: Alert and oriented, speech normal    Assessment & Plan:     [name] is a 70 y.o. female here for  HTN (hypertension) Currently well controlled Continue current medications BMET today F/u in 6 months  GERD (gastroesophageal reflux  disease) Refill prilosec Advised Ca citrate in lieu of carbonate  T2DM (type 2 diabetes mellitus) Previously well controlled Continue metformin a1c today F/u in 6 months  Hyperlipidemia Continue lipitor  Radicular low back pain Could be related to spurring of L ischium Referral to orthopedics - patient has not seen anyone before per her report and would like [facility] if possible       Virginia Crews, MD MPH PGY-2,  [facility] 08/14/2015  11:50 AM

## 2015-08-14 NOTE — Assessment & Plan Note (Signed)
Currently well controlled Continue current medications BMET today F/u in 6 months

## 2015-08-14 NOTE — Patient Instructions (Signed)
Nice to see you again today. Continue taking your current medications for your diabetes, blood pressure, cholesterol. Consider switching from calcium carbonate to calcium citrate for your calcium supplement. We are getting some labs today and someone will call you or send you a letter with the results when they're available. We're referring you to orthopedics and this referral can take up to 2 weeks to process. I'll see back in 6 months to follow-up on your chronic medical conditions.  Take care, Dr. Jacinto Reap

## 2015-08-14 NOTE — Assessment & Plan Note (Signed)
Refill prilosec Advised Ca citrate in lieu of carbonate

## 2015-08-14 NOTE — Assessment & Plan Note (Signed)
Could be related to spurring of L ischium Referral to orthopedics - patient has not seen anyone before per her report and would like [facility] if possible

## 2015-08-14 NOTE — Assessment & Plan Note (Signed)
Previously well controlled Continue metformin a1c today F/u in 6 months

## 2015-08-14 NOTE — Assessment & Plan Note (Signed)
Continue lipitor  ?

## 2015-08-15 ENCOUNTER — Encounter: Payer: Self-pay | Admitting: Family Medicine

## 2015-08-15 LAB — BASIC METABOLIC PANEL WITH GFR
BUN: 12 mg/dL (ref 7–25)
CALCIUM: 9.4 mg/dL (ref 8.6–10.4)
CO2: 27 mmol/L (ref 20–31)
CREATININE: 0.73 mg/dL (ref 0.60–0.93)
Chloride: 103 mmol/L (ref 98–110)
GFR, Est Non African American: 84 mL/min (ref >=60)
GLUCOSE: 111 mg/dL — AB (ref 65–99)
Potassium: 4.2 mmol/L (ref 3.5–5.3)
SODIUM: 139 mmol/L (ref 135–146)

## 2015-08-28 ENCOUNTER — Telehealth: Payer: Self-pay | Admitting: Family Medicine

## 2015-08-28 NOTE — Telephone Encounter (Signed)
Pt called because the referral that she went to does not cover the area of the body she has problems with. Please call to discuss and find another doctor that can help her. jw

## 2015-08-29 NOTE — Telephone Encounter (Signed)
Called patient back about referral.  She states that she did not receive a call from Ortho (was referred to [facility]) about appt so she called [facility] office.  She was told that they would not work on or do shots for ischial spurring.  Told patient that I will check on referral and someone will call her within 1-2 weeks after it is processed.  Will check with referral coordinator about status of referral and whether Ortho is willing to see her for Ischial spurring and radicular leg pain likely resultant from this.  Virginia Crews, MD, MPH PGY-2,  [facility] 08/29/2015 9:45 AM

## 2015-08-29 NOTE — Telephone Encounter (Signed)
It was never specified that patient wanted to go to [facility] (no provider notes in referral). I sent referral to [facility] on 08/14/15. Call them this morning, they received referral but had not yet made contact with patient . Made appt for Fri, 5/26 @ 3:45Pm with Dr. Paulla Fore. Called and advise patient of this. She was thankful for such a fast appt.

## 2015-09-01 ENCOUNTER — Other Ambulatory Visit: Payer: Self-pay | Admitting: Sports Medicine

## 2015-09-01 DIAGNOSIS — M4726 Other spondylosis with radiculopathy, lumbar region: Secondary | ICD-10-CM | POA: Diagnosis not present

## 2015-09-01 DIAGNOSIS — M545 Low back pain: Secondary | ICD-10-CM

## 2015-09-11 ENCOUNTER — Ambulatory Visit
Admission: RE | Admit: 2015-09-11 | Discharge: 2015-09-11 | Disposition: A | Discharge status: Home / Self Care | Payer: Medicare Other | Source: Ambulatory Visit | Attending: Sports Medicine | Admitting: Sports Medicine

## 2015-09-11 DIAGNOSIS — M4806 Spinal stenosis, lumbar region: Secondary | ICD-10-CM | POA: Diagnosis not present

## 2015-09-11 DIAGNOSIS — M545 Low back pain: Secondary | ICD-10-CM

## 2015-09-13 DIAGNOSIS — H401123 Primary open-angle glaucoma, left eye, severe stage: Secondary | ICD-10-CM | POA: Diagnosis not present

## 2015-09-13 DIAGNOSIS — H2513 Age-related nuclear cataract, bilateral: Secondary | ICD-10-CM | POA: Diagnosis not present

## 2015-09-13 DIAGNOSIS — H401113 Primary open-angle glaucoma, right eye, severe stage: Secondary | ICD-10-CM | POA: Diagnosis not present

## 2015-09-13 DIAGNOSIS — Z0100 Encounter for examination of eyes and vision without abnormal findings: Secondary | ICD-10-CM | POA: Diagnosis not present

## 2015-09-14 ENCOUNTER — Other Ambulatory Visit: Payer: Self-pay | Admitting: *Deleted

## 2015-09-14 MED ORDER — BACLOFEN 10 MG PO TABS: ORAL_TABLET | ORAL | Status: DC

## 2015-09-14 MED ORDER — NAPROXEN 375 MG PO TABS: ORAL_TABLET | ORAL | Status: DC

## 2015-09-14 NOTE — Telephone Encounter (Signed)
[facility] requesting 90 day supply for baclofen and naproxen Macky Galik, Orvis Brill, RN

## 2015-09-20 DIAGNOSIS — H401113 Primary open-angle glaucoma, right eye, severe stage: Secondary | ICD-10-CM | POA: Diagnosis not present

## 2015-09-20 DIAGNOSIS — H401123 Primary open-angle glaucoma, left eye, severe stage: Secondary | ICD-10-CM | POA: Diagnosis not present

## 2015-09-22 DIAGNOSIS — A809 Acute poliomyelitis, unspecified: Secondary | ICD-10-CM | POA: Diagnosis not present

## 2015-09-22 DIAGNOSIS — M4726 Other spondylosis with radiculopathy, lumbar region: Secondary | ICD-10-CM | POA: Diagnosis not present

## 2015-09-27 DIAGNOSIS — M9983 Other biomechanical lesions of lumbar region: Secondary | ICD-10-CM | POA: Diagnosis not present

## 2015-09-27 DIAGNOSIS — M545 Low back pain: Secondary | ICD-10-CM | POA: Diagnosis not present

## 2015-09-27 DIAGNOSIS — M5416 Radiculopathy, lumbar region: Secondary | ICD-10-CM | POA: Diagnosis not present

## 2015-09-27 DIAGNOSIS — M5126 Other intervertebral disc displacement, lumbar region: Secondary | ICD-10-CM | POA: Diagnosis not present

## 2015-10-11 ENCOUNTER — Other Ambulatory Visit: Payer: Self-pay | Admitting: *Deleted

## 2015-10-12 MED ORDER — NAPROXEN 375 MG PO TABS: ORAL_TABLET | ORAL | Status: DC

## 2015-10-18 DIAGNOSIS — H401113 Primary open-angle glaucoma, right eye, severe stage: Secondary | ICD-10-CM | POA: Diagnosis not present

## 2015-10-23 DIAGNOSIS — M5126 Other intervertebral disc displacement, lumbar region: Secondary | ICD-10-CM | POA: Diagnosis not present

## 2015-10-23 DIAGNOSIS — M5416 Radiculopathy, lumbar region: Secondary | ICD-10-CM | POA: Diagnosis not present

## 2015-10-31 DIAGNOSIS — E1140 Type 2 diabetes mellitus with diabetic neuropathy, unspecified: Secondary | ICD-10-CM | POA: Diagnosis not present

## 2015-11-13 DIAGNOSIS — M9983 Other biomechanical lesions of lumbar region: Secondary | ICD-10-CM | POA: Diagnosis not present

## 2015-11-13 DIAGNOSIS — M5126 Other intervertebral disc displacement, lumbar region: Secondary | ICD-10-CM | POA: Diagnosis not present

## 2015-11-13 DIAGNOSIS — M545 Low back pain: Secondary | ICD-10-CM | POA: Diagnosis not present

## 2015-11-13 DIAGNOSIS — M5416 Radiculopathy, lumbar region: Secondary | ICD-10-CM | POA: Diagnosis not present

## 2015-11-15 ENCOUNTER — Telehealth: Payer: Self-pay | Admitting: Family Medicine

## 2015-11-15 NOTE — Telephone Encounter (Signed)
Opal Sidles from [employer] was calling to get some information about this pt for a program the pt is enrolled in Please return the call and use reference number [id number]. Thanks! ep

## 2015-11-28 DIAGNOSIS — H401133 Primary open-angle glaucoma, bilateral, severe stage: Secondary | ICD-10-CM | POA: Diagnosis not present

## 2015-12-01 ENCOUNTER — Other Ambulatory Visit: Payer: Self-pay | Admitting: Family Medicine

## 2015-12-01 DIAGNOSIS — E1142 Type 2 diabetes mellitus with diabetic polyneuropathy: Secondary | ICD-10-CM

## 2015-12-02 DIAGNOSIS — Z23 Encounter for immunization: Secondary | ICD-10-CM | POA: Diagnosis not present

## 2015-12-20 DIAGNOSIS — H401123 Primary open-angle glaucoma, left eye, severe stage: Secondary | ICD-10-CM | POA: Diagnosis not present

## 2016-01-09 DIAGNOSIS — M9983 Other biomechanical lesions of lumbar region: Secondary | ICD-10-CM | POA: Diagnosis not present

## 2016-01-09 DIAGNOSIS — M5126 Other intervertebral disc displacement, lumbar region: Secondary | ICD-10-CM | POA: Diagnosis not present

## 2016-01-09 DIAGNOSIS — M5416 Radiculopathy, lumbar region: Secondary | ICD-10-CM | POA: Diagnosis not present

## 2016-01-17 ENCOUNTER — Ambulatory Visit (INDEPENDENT_AMBULATORY_CARE_PROVIDER_SITE_OTHER): Payer: Medicare Other | Admitting: Podiatry

## 2016-01-17 ENCOUNTER — Encounter: Payer: Self-pay | Admitting: Podiatry

## 2016-01-17 VITALS — BP 140/81 | HR 76 | Resp 18

## 2016-01-17 DIAGNOSIS — E119 Type 2 diabetes mellitus without complications: Secondary | ICD-10-CM | POA: Diagnosis not present

## 2016-01-17 DIAGNOSIS — B91 Sequelae of poliomyelitis: Secondary | ICD-10-CM

## 2016-01-17 DIAGNOSIS — M21769 Unequal limb length (acquired), unspecified tibia and fibula: Secondary | ICD-10-CM | POA: Diagnosis not present

## 2016-01-17 DIAGNOSIS — M6281 Muscle weakness (generalized): Secondary | ICD-10-CM

## 2016-01-17 NOTE — Patient Instructions (Signed)
Today your diabetic foot exam demonstrated adequate circulation and protective sensation is intact Continue to wear your buildup shoeing a right foot and wearing her AFO as needed Return yearly for diabetic foot exam or as needed for specific problem  Diabetes and Foot Care Diabetes may cause you to have problems because of poor blood supply (circulation) to your feet and legs. This may cause the skin on your feet to become thinner, break easier, and heal more slowly. Your skin may become dry, and the skin may peel and crack. You may also have nerve damage in your legs and feet causing decreased feeling in them. You may not notice minor injuries to your feet that could lead to infections or more serious problems. Taking care of your feet is one of the most important things you can do for yourself.  HOME CARE INSTRUCTIONS  Wear shoes at all times, even in the house. Do not go barefoot. Bare feet are easily injured.  Check your feet daily for blisters, cuts, and redness. If you cannot see the bottom of your feet, use a mirror or ask someone for help.  Wash your feet with warm water (do not use hot water) and mild soap. Then pat your feet and the areas between your toes until they are completely dry. Do not soak your feet as this can dry your skin.  Apply a moisturizing lotion or petroleum jelly (that does not contain alcohol and is unscented) to the skin on your feet and to dry, brittle toenails. Do not apply lotion between your toes.  Trim your toenails straight across. Do not dig under them or around the cuticle. File the edges of your nails with an emery board or nail file.  Do not cut corns or calluses or try to remove them with medicine.  Wear clean socks or stockings every day. Make sure they are not too tight. Do not wear knee-high stockings since they may decrease blood flow to your legs.  Wear shoes that fit properly and have enough cushioning. To break in new shoes, wear them for just a  few hours a day. This prevents you from injuring your feet. Always look in your shoes before you put them on to be sure there are no objects inside.  Do not cross your legs. This may decrease the blood flow to your feet.  If you find a minor scrape, cut, or break in the skin on your feet, keep it and the skin around it clean and dry. These areas may be cleansed with mild soap and water. Do not cleanse the area with peroxide, alcohol, or iodine.  When you remove an adhesive bandage, be sure not to damage the skin around it.  If you have a wound, look at it several times a day to make sure it is healing.  Do not use heating pads or hot water bottles. They may burn your skin. If you have lost feeling in your feet or legs, you may not know it is happening until it is too late.  Make sure your health care provider performs a complete foot exam at least annually or more often if you have foot problems. Report any cuts, sores, or bruises to your health care provider immediately. SEEK MEDICAL CARE IF:   You have an injury that is not healing.  You have cuts or breaks in the skin.  You have an ingrown nail.  You notice redness on your legs or feet.  You feel burning or  tingling in your legs or feet.  You have pain or cramps in your legs and feet.  Your legs or feet are numb.  Your feet always feel cold. SEEK IMMEDIATE MEDICAL CARE IF:   There is increasing redness, swelling, or pain in or around a wound.  There is a red line that goes up your leg.  Pus is coming from a wound.  You develop a fever or as directed by your health care provider.  You notice a bad smell coming from an ulcer or wound.   This information is not intended to replace advice given to you by your health care provider. Make sure you discuss any questions you have with your health care provider.   Document Released: 03/22/2000 Document Revised: 11/25/2012 Document Reviewed: 09/01/2012 Elsevier Interactive  Patient Education [employer].

## 2016-01-17 NOTE — Progress Notes (Signed)
   Subjective:    Patient ID: [name], female    DOB: 1945/10/19, 70 y.o.   MRN: [id number]  HPI     This patient presents today for her yearly diabetic foot examination. She denies any history of claudication, amputation or skin ulcerations. She was last evaluated in our office on 01/18/2015. Patient has history of polio resulting in right lower leg atrophy and weakness patient wears AFO on the right    Review of Systems  All other systems reviewed and are negative.      Objective:   Physical Exam  BP 140/81 Pulse 76 Respiration 18  Orientated 3  Vascular: DP and PT pulses 2/4 bilaterally Capillary reflex immediate bilaterally  Neurological: Sensation to 10 g monofilament wire intact 5/5 bilaterally Vibratory sensation reactive bilaterally Ankle x-rays reactive bilaterally  Dermatological: No open skin lesions bilaterally No corns or calluses noted bilaterally  Musculoskeletal: Atrophy of right lower extremity Right foot appears smaller than left Limb left inner quality right lower extremity approximately 1/2-1 inch shorter than left Manual motor testing: Dorsi flexion 4/5 right 5/5 left Plantar flexion 5/5 bilaterally Inversion 0/5 right 5/5 left Eversion 0/5 right and 5/5 left       Assessment & Plan:   Assessment: Diabetic without foot complications associated with diabetes Post polio right lower extremity weakness  Plan: I reviewed the results of the exam with patient today and informed her from a diabetic foot status that there are no problems noted today.  Patient wears a half-inch full soled heel left on the right shoe Patient has AFO which she continues to use  Reappoint yearly or for specific problems

## 2016-01-18 ENCOUNTER — Encounter (HOSPITAL_COMMUNITY): Payer: Self-pay | Admitting: Emergency Medicine

## 2016-01-18 ENCOUNTER — Emergency Department (HOSPITAL_COMMUNITY)
Admission: EM | Admit: 2016-01-18 | Discharge: 2016-01-18 | Disposition: A | Discharge status: Home / Self Care | Payer: Medicare Other | Attending: Emergency Medicine | Admitting: Emergency Medicine

## 2016-01-18 DIAGNOSIS — M5431 Sciatica, right side: Secondary | ICD-10-CM | POA: Diagnosis not present

## 2016-01-18 DIAGNOSIS — E119 Type 2 diabetes mellitus without complications: Secondary | ICD-10-CM | POA: Insufficient documentation

## 2016-01-18 DIAGNOSIS — I10 Essential (primary) hypertension: Secondary | ICD-10-CM | POA: Insufficient documentation

## 2016-01-18 DIAGNOSIS — Z96652 Presence of left artificial knee joint: Secondary | ICD-10-CM | POA: Insufficient documentation

## 2016-01-18 DIAGNOSIS — M79604 Pain in right leg: Secondary | ICD-10-CM | POA: Diagnosis present

## 2016-01-18 DIAGNOSIS — Z7984 Long term (current) use of oral hypoglycemic drugs: Secondary | ICD-10-CM | POA: Insufficient documentation

## 2016-01-18 DIAGNOSIS — Z7982 Long term (current) use of aspirin: Secondary | ICD-10-CM | POA: Diagnosis not present

## 2016-01-18 MED ORDER — METHYLPREDNISOLONE 4 MG PO TBPK: ORAL_TABLET | ORAL | 0 refills | Status: DC

## 2016-01-18 MED ORDER — OXYCODONE-ACETAMINOPHEN 5-325 MG PO TABS
2.0000 | ORAL_TABLET | Freq: Once | ORAL | Status: AC
  Administered 2016-01-18: 2 via ORAL
  Filled 2016-01-18: qty 2

## 2016-01-18 MED ORDER — OXYCODONE-ACETAMINOPHEN 5-325 MG PO TABS: 2.0000 | ORAL_TABLET | ORAL | 0 refills | Status: DC | PRN

## 2016-01-18 MED ORDER — PREDNISONE 20 MG PO TABS
60.0000 mg | ORAL_TABLET | Freq: Once | ORAL | Status: AC
  Administered 2016-01-18: 60 mg via ORAL
  Filled 2016-01-18: qty 3

## 2016-01-18 NOTE — ED Provider Notes (Signed)
[facility] DEPT Provider Note   CSN: [id number] Arrival date & time: 01/18/16  1159     History   Chief Complaint Chief Complaint  Patient presents with  . Leg Pain    HPI [name] is a 70 y.o. female. 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 She has a 3" right leg leg length discrepancy. He occasionally will get her leg. She has a baseline Trendelenburg gait and uses a walker. She has had an MRI of her back and told she has some "back problems". She sees Dr. Dayton Bailiff for this. She also has seen pain management and had epidural steroid injections. Most recently 2 weeks ago. Her back pain is improved. Her leg pain has not. Pain is from her mid buttock down the posterior aspect of her leg to her heel. She is not any weaker than usual with the leg. No rash or vesicles or anything to suggest zoster. No falls injuries or trauma. No bowel or bladder changes.  HPI  Past Medical History:  Diagnosis Date  . Abnormal Pap smear of cervix 1970   Cryosurgery done  . Arthritis    osteoarthritis  . Back pain, chronic    per pt disc disease, disc herniation- Followed by Dr. Cyndy Freeze  . Carpal tunnel syndrome on both sides    Followed by Dr. Burney Gauze  . DDD (degenerative disc disease)    Lumbar Spondylosis  . Diabetes mellitus   . GERD (gastroesophageal reflux disease)   . Glaucoma   . Hyperlipidemia   . Hypertension   . 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 Trigger finger   . Vitamin D deficiency    replaced 2010    Patient Active Problem List   Diagnosis Date Noted  . GERD (gastroesophageal reflux disease) 10/26/2014  . Healthcare maintenance 05/31/2014  . T2DM (type 2 diabetes mellitus) ([place]) 05/31/2014  . 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 Obesity (BMI 30-39.9) 03/24/2012  . Biceps tendon tear 08/10/2010  . Shoulder pain 08/10/2010  . HTN (hypertension)  08/03/2010  . Hyperlipidemia 08/03/2010  . Glaucoma 08/03/2010  . Radicular low back pain 08/03/2010    Past Surgical History:  Procedure Laterality Date  . ABDOMINAL HYSTERECTOMY     partial  . CHOLECYSTECTOMY    . HAND SURGERY     Right wrist, s/p plate removal in Feb 2012  . JOINT REPLACEMENT  2006   left knee    OB History    No data available       Home Medications    Prior to Admission medications   Medication Sig Start Date End Date Taking? Authorizing Provider  aspirin 81 MG tablet Take 1 tablet (81 mg total) by mouth daily. Patient taking differently: Take 81 mg by mouth daily after supper.  09/30/11   Deneise Lever, MD  atorvastatin (LIPITOR) 40 MG tablet TAKE 1 TABLET (40 MG TOTAL) BY MOUTH DAILY. Patient taking differently: TAKE 1 TABLET (40 MG TOTAL) BY MOUTH DAILY AFTER SUPPER 06/08/15   Virginia Crews, MD  baclofen (LIORESAL) 10 MG tablet TAKE 0.5 TABLETS (5 MG TOTAL) BY MOUTH 3 (THREE) TIMES DAILY AS NEEDED FOR MUSCLE SPASMS. 09/14/15   Virginia Crews, MD  bimatoprost (LUMIGAN) 0.01 % SOLN Place 1 drop into both eyes at bedtime.    Historical Provider, MD  brimonidine-timolol (COMBIGAN) 0.2-0.5 % ophthalmic solution  Place 1 drop into both eyes every 12 (twelve) hours. 1 drop each eye every 12 hours Patient taking differently: Place 1 drop into both eyes every 12 (twelve) hours.  09/30/11   Deneise Lever, MD  calcium carbonate (OS-CAL) 600 MG TABS Take 1 tablet (600 mg total) by mouth daily. 09/30/11   Deneise Lever, MD  cholecalciferol (VITAMIN D) 1000 UNITS tablet Take 1 tablet (1,000 Units total) by mouth daily. 10/14/13   Virginia Crews, MD  dorzolamide (TRUSOPT) 2 % ophthalmic solution Place 1 drop into the right eye 2 (two) times daily.    Historical Provider, MD  gabapentin (NEURONTIN) 400 MG capsule TAKE 2 CAPSULES BY MOUTH IN THE MORNING,3 CAPSULES IN THE EVENING,AND 3 CAPSULES AT BEDTIME Patient taking differently: Take 800-1,200 mg by mouth  3 (three) times daily. Take 2 capsules (800 mg) by mouth every morning, 3 capsules (1200 mg) daily at 5pm and 3 capsules (1200 mg) at bedtime 04/04/15   Virginia Crews, MD  hydrochlorothiazide (HYDRODIURIL) 25 MG tablet Take 1 tablet (25 mg total) by mouth daily. 08/14/15   Virginia Crews, MD  ibuprofen (ADVIL,MOTRIN) 400 MG tablet Take 1 tablet (400 mg total) by mouth 3 (three) times daily after meals. 07/29/15   Julianne Rice, MD  metFORMIN (GLUCOPHAGE) 500 MG tablet TAKE 1 TABLET (500 MG TOTAL) BY MOUTH 2 (TWO) TIMES DAILY WITH A MEAL. 12/01/15   Katheren Shams, DO  methylPREDNISolone (MEDROL DOSEPAK) 4 MG TBPK tablet 6 po on day 1, decrease by 1 tab per day 01/18/16   Tanna Furry, MD  Multiple Vitamin (MULTIVITAMIN WITH MINERALS) TABS tablet Take 1 tablet by mouth daily.    Historical Provider, MD  naproxen (NAPROSYN) 375 MG tablet TAKE 1 TABLET (375 MG TOTAL) BY MOUTH 2 (TWO) TIMES DAILY prn. 10/12/15   Virginia Crews, MD  omeprazole (PRILOSEC) 20 MG capsule Take 1 capsule (20 mg total) by mouth 2 (two) times daily. 08/14/15   Virginia Crews, MD  Oxycodone HCl 10 MG TABS Take 10 mg by mouth every 6 (six) hours as needed. 07/19/15   Historical Provider, MD  oxyCODONE-acetaminophen (PERCOCET/ROXICET) 5-325 MG tablet Take 2 tablets by mouth every 4 (four) hours as needed. 01/18/16   Tanna Furry, MD  ramipril (ALTACE) 10 MG capsule Take 1 capsule (10 mg total) by mouth daily. 08/14/15   Virginia Crews, MD    Family History Family History  Problem Relation Age of Onset  . Heart disease Father   . Heart disease Mother   . Heart disease Sister   . Hyperlipidemia Sister   . Hypertension Sister   . Kidney disease Brother     Social History Social History  Substance Use Topics  . Smoking status: Never Smoker  . Smokeless tobacco: Never Used  . Alcohol use No     Allergies   Review of patient's allergies indicates no known allergies.   Review of Systems Review of Systems   Constitutional: Negative for appetite change, chills, diaphoresis, fatigue and fever.  HENT: Negative for mouth sores, sore throat and trouble swallowing.   Eyes: Negative for visual disturbance.  Respiratory: Negative for cough, chest tightness, shortness of breath and wheezing.   Cardiovascular: Negative for chest pain.  Gastrointestinal: Negative for abdominal distention, abdominal pain, diarrhea, nausea and vomiting.  Endocrine: Negative for polydipsia, polyphagia and polyuria.  Genitourinary: Negative for dysuria, frequency and hematuria.  Musculoskeletal: Positive for arthralgias, gait problem and myalgias.  Skin: Negative  for color change, pallor and rash.  Neurological: Negative for dizziness, syncope, light-headedness and headaches.  Hematological: Does not bruise/bleed easily.  Psychiatric/Behavioral: Negative for behavioral problems and confusion.     Physical Exam Updated Vital Signs BP 120/70   Pulse 83   Temp 98.5 F (36.9 C) (Oral)   Resp 16   Ht 5\' 3"  (1.6 m)   Wt 200 lb (90.7 kg)   SpO2 96%   BMI 35.43 kg/m   Physical Exam  Constitutional: She is oriented to person, place, and time. She appears well-developed and well-nourished. No distress.  HENT:  Head: Normocephalic.  Eyes: Conjunctivae are normal. Pupils are equal, round, and reactive to light. No scleral icterus.  Neck: Normal range of motion. Neck supple. No thyromegaly present.  Cardiovascular: Normal rate and regular rhythm.  Exam reveals no gallop and no friction rub.   No murmur heard. Pulmonary/Chest: Effort normal and breath sounds normal. No respiratory distress. She has no wheezes. She has no rales.  Abdominal: Soft. Bowel sounds are normal. She exhibits no distension. There is no tenderness. There is no rebound.  Musculoskeletal: Normal range of motion.       Legs: Right leg shorter than left. Subtle atrophy  Neurological: She is alert and oriented to person, place, and time.  Skin: Skin is  warm and dry. No rash noted.  Psychiatric: She has a normal mood and affect. Her behavior is normal.     ED Treatments / Results  Labs (all labs ordered are listed, but only abnormal results are displayed) Labs Reviewed - No data to display  EKG  EKG Interpretation None       Radiology No results found.  Procedures Procedures (including critical care time)  Medications Ordered in ED Medications  predniSONE (DELTASONE) tablet 60 mg (not administered)  oxyCODONE-acetaminophen (PERCOCET/ROXICET) 5-325 MG per tablet 2 tablet (not administered)     Initial Impression / Assessment and Plan / ED Course  I have reviewed the triage vital signs and the nursing notes.  Pertinent labs & imaging results that were available during my care of the patient were reviewed by me and considered in my medical decision making (see chart for details).  Clinical Course    No symptoms or findings to suggest that this is related to radiculopathy or herniated nucleus. She has no change in functional loss other than her typical post polio symptoms and no pain which is consistent with sciatic nerve irritation. She is on metformin for non-insulin-dependent diabetes. She states that she did notice a slight bump in her sugars after her epidural steroid injection a few weeks ago. She states however that they were only around 200. I have cautioned her that on oral steroids she will likely have a bump in her sugars and to not be surprised by this and make sure she stays hydrated. Otherwise refer her back to primary care and her pain management and neurosurgeon.  Final Clinical Impressions(s) / ED Diagnoses   Final diagnoses:  Sciatica of right side    New Prescriptions New Prescriptions   METHYLPREDNISOLONE (MEDROL DOSEPAK) 4 MG TBPK TABLET    6 po on day 1, decrease by 1 tab per day   OXYCODONE-ACETAMINOPHEN (PERCOCET/ROXICET) 5-325 MG TABLET    Take 2 tablets by mouth every 4 (four) hours as needed.       Tanna Furry, MD 01/18/16 1325

## 2016-01-18 NOTE — ED Triage Notes (Signed)
Pt states for the last week she has had pain in her right leg from the thigh down. Pt states she is able to walk with a walker but is painful. Pt states she has a history of polio and this has happen before. Pt denies any weakness or tingling.

## 2016-01-29 ENCOUNTER — Other Ambulatory Visit: Payer: Self-pay | Admitting: Family Medicine

## 2016-01-31 DIAGNOSIS — E1140 Type 2 diabetes mellitus with diabetic neuropathy, unspecified: Secondary | ICD-10-CM | POA: Diagnosis not present

## 2016-02-05 DIAGNOSIS — H401133 Primary open-angle glaucoma, bilateral, severe stage: Secondary | ICD-10-CM | POA: Diagnosis not present

## 2016-02-06 DIAGNOSIS — G14 Postpolio syndrome: Secondary | ICD-10-CM | POA: Insufficient documentation

## 2016-02-06 DIAGNOSIS — M5416 Radiculopathy, lumbar region: Secondary | ICD-10-CM | POA: Diagnosis not present

## 2016-02-14 DIAGNOSIS — M9983 Other biomechanical lesions of lumbar region: Secondary | ICD-10-CM | POA: Diagnosis not present

## 2016-02-14 DIAGNOSIS — M5416 Radiculopathy, lumbar region: Secondary | ICD-10-CM | POA: Diagnosis not present

## 2016-02-19 ENCOUNTER — Other Ambulatory Visit: Payer: Self-pay | Admitting: Family Medicine

## 2016-02-19 ENCOUNTER — Other Ambulatory Visit: Payer: Self-pay | Admitting: Obstetrics and Gynecology

## 2016-02-19 DIAGNOSIS — E1142 Type 2 diabetes mellitus with diabetic polyneuropathy: Secondary | ICD-10-CM

## 2016-03-08 ENCOUNTER — Ambulatory Visit (INDEPENDENT_AMBULATORY_CARE_PROVIDER_SITE_OTHER): Payer: Medicare Other | Admitting: Family Medicine

## 2016-03-08 ENCOUNTER — Encounter: Payer: Self-pay | Admitting: Family Medicine

## 2016-03-08 VITALS — BP 149/80 | HR 82 | Temp 98.4°F | Ht 63.0 in | Wt 195.8 lb

## 2016-03-08 DIAGNOSIS — I10 Essential (primary) hypertension: Secondary | ICD-10-CM | POA: Diagnosis not present

## 2016-03-08 DIAGNOSIS — M5410 Radiculopathy, site unspecified: Secondary | ICD-10-CM

## 2016-03-08 DIAGNOSIS — E1121 Type 2 diabetes mellitus with diabetic nephropathy: Secondary | ICD-10-CM | POA: Diagnosis not present

## 2016-03-08 DIAGNOSIS — E785 Hyperlipidemia, unspecified: Secondary | ICD-10-CM | POA: Diagnosis not present

## 2016-03-08 LAB — LIPID PANEL
CHOL/HDL RATIO: 2 ratio (ref <5.0)
CHOLESTEROL: 127 mg/dL (ref <200)
HDL: 65 mg/dL (ref >50)
LDL Cholesterol: 46 mg/dL (ref <100)
Triglycerides: 78 mg/dL (ref <150)
VLDL: 16 mg/dL (ref <30)

## 2016-03-08 LAB — COMPLETE METABOLIC PANEL WITH GFR
ALBUMIN: 4 g/dL (ref 3.6–5.1)
ALK PHOS: 54 U/L (ref 33–130)
ALT: 13 U/L (ref 6–29)
AST: 12 U/L (ref 10–35)
BILIRUBIN TOTAL: 0.6 mg/dL (ref 0.2–1.2)
BUN: 11 mg/dL (ref 7–25)
CALCIUM: 9.6 mg/dL (ref 8.6–10.4)
CO2: 28 mmol/L (ref 20–31)
CREATININE: 0.82 mg/dL (ref 0.60–0.93)
Chloride: 102 mmol/L (ref 98–110)
GFR, Est African American: 84 mL/min (ref >=60)
GFR, Est Non African American: 73 mL/min (ref >=60)
GLUCOSE: 116 mg/dL — AB (ref 65–99)
POTASSIUM: 4.1 mmol/L (ref 3.5–5.3)
SODIUM: 139 mmol/L (ref 135–146)
TOTAL PROTEIN: 6.5 g/dL (ref 6.1–8.1)

## 2016-03-08 LAB — POCT GLYCOSYLATED HEMOGLOBIN (HGB A1C): Hemoglobin A1C: 6.2

## 2016-03-08 MED ORDER — OXYCODONE-ACETAMINOPHEN 5-325 MG PO TABS: 1.0000 | ORAL_TABLET | Freq: Four times a day (QID) | ORAL | 0 refills | Status: DC | PRN

## 2016-03-08 MED ORDER — GABAPENTIN 400 MG PO CAPS: 1200.0000 mg | ORAL_CAPSULE | Freq: Three times a day (TID) | ORAL | 3 refills | Status: DC

## 2016-03-08 NOTE — Assessment & Plan Note (Signed)
Well-controlled A1c is 6.2 today Followed by podiatry for foot exams Reportedly up-to-date on eye exams - patient to have records sent over from her ophthalmologist Continue metformin Follow-up in 6 months

## 2016-03-08 NOTE — Assessment & Plan Note (Signed)
Continues to followed by neurology Advised patient that she could need orthopedic or neurosurgical intervention in the future if this continues to worsen Difficult exam due to baseline weakness secondary to previous polio infection Increase gabapentin to max dose of 1200 mg 3 times a day Oxycodone given for severe breakthrough pain Continue baclofen Stop NSAIDs

## 2016-03-08 NOTE — Progress Notes (Signed)
Subjective:   [name] is a 70 y.o. female with a history of T2 DM with peripheral neuropathy, HTN, HLD, postpolio syndrome here for back pain and chronic disease follow-up  HTN: - Medications: ramipril 10mg  daily, HCTZ 25mg  daily - Compliance: good - Checking BP at home: no - Denies any SOB, CP, vision changes, LE edema, medication SEs, or symptoms of hypotension - Diet: cutting back on intake - Exercise: unable due to post-polio syndrome  T2DM - Checking BG at home: no - Medications: metformin 500mg  BID - Compliance: good - eye exam: 08/2015 - goes again in 04/2016 - foot exam: needs today - microalbumin: n/a ACEi - denies symptoms of hypoglycemia, polyuria, polydipsia, numbness extremities, foot ulcers/trauma  HLD - medications: Lipitor 40mg  daily - compliance: good  - medication SEs: none  LBP - radiates down R leg (also leg that had polio) - Present for 6 months - had previously been seen by pain management for epidural steroid injections - stopped cworking - followed by neurology - recent NCS - f/u appt 12/19 - has seen [facility] - MRI L spine 09/2015 with multilevel spondylosis and right-sided neural impingement at L3-S1 and worsening since 2015 - hurts to walk on it - baseline weakness - no fevers - does have urinary incontinece - unsure of onset - still taking baclofen, naproxen - used to take oxycodone intermittently for breakthrough pain - Taking gabapentin 800 mg every morning, 1200 mg in the afternoon and 1200 mg daily at bedtime   Review of Systems:  Per HPI.   Social History: Never smoker  Objective:  BP (!) 149/80   Pulse 82   Temp 98.4 F (36.9 C) (Oral)   Ht 5\' 3"  (1.6 m)   Wt 195 lb 12.8 oz (88.8 kg)   BMI 34.68 kg/m    Gen:  70 y.o. female in NAD  HEENT: NCAT, MMM, EOMI, PERRL, anicteric sclerae CV: RRR, no MRG Resp: Non-labored, CTAB, no wheezes noted Ext: WWP, no edema MSK: 3 out of 5 weakness diffusely in right leg which  is baseline for the patient, sensation intact in lower extremities, no tenderness palpation over lumbar spine, negative straight leg raise Neuro: Alert and oriented, speech normal       Chemistry      Component Value Date/Time   NA 139 08/14/2015 1059   NA 139 04/26/2010   K 4.2 08/14/2015 1059   CL 103 08/14/2015 1059   CO2 27 08/14/2015 1059   BUN 12 08/14/2015 1059   BUN 9 04/26/2010   CREATININE 0.73 08/14/2015 1059   GLU 97 04/26/2010      Component Value Date/Time   CALCIUM 9.4 08/14/2015 1059   ALKPHOS 72 02/27/2011 0942   AST 12 02/27/2011 0942   ALT 9 02/27/2011 0942   BILITOT 0.5 02/27/2011 0942      Lab Results  Component Value Date   WBC 5.1 02/27/2011   HGB 13.0 02/27/2011   HCT 41.6 02/27/2011   MCV 81.7 02/27/2011   PLT 263 02/27/2011   Lab Results  Component Value Date   TSH 0.755 03/24/2012   Lab Results  Component Value Date   HGBA1C 6.2 03/08/2016   Assessment & Plan:     Garnette Mollison is a 70 y.o. female here for   HTN (hypertension) Uncontrolled today, patient reports back pain that is likely contributing Advised to continue taking current medications and follow-up in one month  T2DM (type 2 diabetes mellitus) Well-controlled A1c is 6.2  today Followed by podiatry for foot exams Reportedly up-to-date on eye exams - patient to have records sent over from her ophthalmologist Continue metformin Follow-up in 6 months  Hyperlipidemia Continue Lipitor Recheck lipids today Also check CMP  Radicular low back pain Continues to followed by neurology Advised patient that she could need orthopedic or neurosurgical intervention in the future if this continues to worsen Difficult exam due to baseline weakness secondary to previous polio infection Increase gabapentin to max dose of 1200 mg 3 times a day Oxycodone given for severe breakthrough pain Continue baclofen Stop NSAIDs     Virginia Crews, MD MPH PGY-3,  [facility] 03/08/2016  10:28 AM

## 2016-03-08 NOTE — Assessment & Plan Note (Signed)
Continue Lipitor Recheck lipids today Also check CMP

## 2016-03-08 NOTE — Patient Instructions (Addendum)
Nice to see you again today. Continue current medications - gabapentin was increased.  I will see you back about back pain in 1 month.  We are getting some labs today and someone will call or send a letter with the results when they are available.  Take care, Dr. Jacinto Reap

## 2016-03-08 NOTE — Assessment & Plan Note (Signed)
Uncontrolled today, patient reports back pain that is likely contributing Advised to continue taking current medications and follow-up in one month

## 2016-03-11 ENCOUNTER — Encounter: Payer: Self-pay | Admitting: Family Medicine

## 2016-03-19 ENCOUNTER — Other Ambulatory Visit: Payer: Self-pay | Admitting: Family Medicine

## 2016-03-26 DIAGNOSIS — M9983 Other biomechanical lesions of lumbar region: Secondary | ICD-10-CM | POA: Diagnosis not present

## 2016-03-26 DIAGNOSIS — M5416 Radiculopathy, lumbar region: Secondary | ICD-10-CM | POA: Diagnosis not present

## 2016-04-03 DIAGNOSIS — Z1231 Encounter for screening mammogram for malignant neoplasm of breast: Secondary | ICD-10-CM | POA: Diagnosis not present

## 2016-04-08 HISTORY — PX: SPINE SURGERY: SHX786

## 2016-04-11 DIAGNOSIS — R921 Mammographic calcification found on diagnostic imaging of breast: Secondary | ICD-10-CM | POA: Diagnosis not present

## 2016-04-17 ENCOUNTER — Other Ambulatory Visit: Payer: Self-pay | Admitting: Radiology

## 2016-04-17 DIAGNOSIS — R938 Abnormal findings on diagnostic imaging of other specified body structures: Secondary | ICD-10-CM | POA: Diagnosis not present

## 2016-04-17 DIAGNOSIS — D242 Benign neoplasm of left breast: Secondary | ICD-10-CM | POA: Diagnosis not present

## 2016-04-17 DIAGNOSIS — R921 Mammographic calcification found on diagnostic imaging of breast: Secondary | ICD-10-CM | POA: Diagnosis not present

## 2016-04-22 ENCOUNTER — Ambulatory Visit (INDEPENDENT_AMBULATORY_CARE_PROVIDER_SITE_OTHER): Payer: Medicare Other | Admitting: Family Medicine

## 2016-04-22 VITALS — BP 138/80 | HR 80 | Temp 98.0°F | Ht 63.0 in | Wt 196.0 lb

## 2016-04-22 DIAGNOSIS — M5410 Radiculopathy, site unspecified: Secondary | ICD-10-CM

## 2016-04-22 DIAGNOSIS — I10 Essential (primary) hypertension: Secondary | ICD-10-CM

## 2016-04-22 DIAGNOSIS — W19XXXA Unspecified fall, initial encounter: Secondary | ICD-10-CM | POA: Insufficient documentation

## 2016-04-22 NOTE — Assessment & Plan Note (Signed)
Mechanical fall 2 days ago related to weakness and radicular pain in right leg related to postpolio syndrome No other recent falls No severe injuries noted at this time Advised that physical therapy will be helpful after her surgery

## 2016-04-22 NOTE — Progress Notes (Signed)
Subjective:   [name] is a 71 y.o. female with a history of T2 DM with peripheral neuropathy, HTN, HLD, post polio syndrome, chronic low back pain with sciatica here for hypertension follow-up and fall  HTN: - Medications: Ramipril 10 mg daily, HCTZ 25 mg daily - Compliance: Good but has not taken today - Checking BP at home: No - Denies any SOB, CP, vision changes, LE edema, medication SEs, or symptoms of hypotension - Diet: Cutting back on intake - Exercise: Unable to to post polio syndrome  Fall 2 days ago coming home from work R leg (polio) gave out No previous falls Hit legs Friend came to help her up Continues to have L flank pain x several moinths Taking gabapentin and oxycodone for LBP that doesn't help No N/V/D, constipation, dysuria, frequency, fevers havign surgery on 05/10/16 for L4-L5 lumbar fusion  Review of Systems:  Per HPI.   Social History: Never smoker  Objective:  BP 138/80 (BP Location: Right Arm, Patient Position: Sitting, Cuff Size: Large)   Pulse 80   Temp 98 F (36.7 C) (Oral)   Ht 5\' 3"  (1.6 m)   Wt 196 lb (88.9 kg)   SpO2 97%   BMI 34.72 kg/m   Gen:  71 y.o. female in NAD HEENT: NCAT, MMM, EOMI, PERRL, anicteric sclerae CV: RRR, no MRG Resp: Non-labored, CTAB, no wheezes noted Abd: Soft, NTND, BS present, no guarding or organomegaly, no CVAT Ext: WWP, trace edema MSK: walks with limp and cane, R leg shorter than left, TTP over L oblique muscles, weakness of R leg Neuro: Alert and oriented, speech normal       Chemistry      Component Value Date/Time   NA 139 03/08/2016 0937   NA 139 04/26/2010   K 4.1 03/08/2016 0937   CL 102 03/08/2016 0937   CO2 28 03/08/2016 0937   BUN 11 03/08/2016 0937   BUN 9 04/26/2010   CREATININE 0.82 03/08/2016 0937   GLU 97 04/26/2010      Component Value Date/Time   CALCIUM 9.6 03/08/2016 0937   ALKPHOS 54 03/08/2016 0937   AST 12 03/08/2016 0937   ALT 13 03/08/2016 0937   BILITOT 0.6  03/08/2016 0937      Lab Results  Component Value Date   WBC 5.1 02/27/2011   HGB 13.0 02/27/2011   HCT 41.6 02/27/2011   MCV 81.7 02/27/2011   PLT 263 02/27/2011   Lab Results  Component Value Date   TSH 0.755 03/24/2012   Lab Results  Component Value Date   HGBA1C 6.2 03/08/2016   Assessment & Plan:     [name] is a 71 y.o. female here for   HTN (hypertension) Well-controlled Continue current medications Follow-up in 3 months  Radicular low back pain Neurosurgery planning for lumbar fusion in 2 weeks Continue gabapentin 1200 mg 3 times a day Oxycodone for severe breakthrough pain Continue baclofen Now with left-sided compensatory muscular pain likely related to limp and leg length discrepancy No evidence of urinary or bowel symptoms or exam findings Advised patient to wear her shoe with a heel lift Advised the physical therapy for gait training will be useful after surgery  Fall Mechanical fall 2 days ago related to weakness and radicular pain in right leg related to postpolio syndrome No other recent falls No severe injuries noted at this time Advised that physical therapy will be helpful after her surgery   Virginia Crews, MD MPH PGY-3,  [facility] 04/22/2016  10:27 AM

## 2016-04-22 NOTE — Patient Instructions (Signed)
Nice to see you again. Your blood pressure is well controlled today. Continue current medications.  After your back surgery, it will be important to get some physical therapy. If the neurosurgeon does not refer you to physical therapy, please give me a call and I will order it.   I will see back in 3 months for your blood pressure.  Take care, Dr. Jacinto Reap

## 2016-04-22 NOTE — Assessment & Plan Note (Signed)
Neurosurgery planning for lumbar fusion in 2 weeks Continue gabapentin 1200 mg 3 times a day Oxycodone for severe breakthrough pain Continue baclofen Now with left-sided compensatory muscular pain likely related to limp and leg length discrepancy No evidence of urinary or bowel symptoms or exam findings Advised patient to wear her shoe with a heel lift Advised the physical therapy for gait training will be useful after surgery

## 2016-04-22 NOTE — Assessment & Plan Note (Signed)
Well-controlled Continue current medications Follow-up in 3 months

## 2016-04-30 DIAGNOSIS — H401133 Primary open-angle glaucoma, bilateral, severe stage: Secondary | ICD-10-CM | POA: Diagnosis not present

## 2016-05-01 ENCOUNTER — Other Ambulatory Visit: Payer: Self-pay | Admitting: Neurosurgery

## 2016-05-03 DIAGNOSIS — E1140 Type 2 diabetes mellitus with diabetic neuropathy, unspecified: Secondary | ICD-10-CM | POA: Diagnosis not present

## 2016-05-06 ENCOUNTER — Encounter (HOSPITAL_COMMUNITY)
Admission: RE | Admit: 2016-05-06 | Discharge: 2016-05-06 | Disposition: A | Discharge status: Home / Self Care | Payer: Medicare Other | Source: Ambulatory Visit | Attending: Neurosurgery | Admitting: Neurosurgery

## 2016-05-06 ENCOUNTER — Encounter (HOSPITAL_COMMUNITY): Payer: Self-pay

## 2016-05-06 DIAGNOSIS — Z01818 Encounter for other preprocedural examination: Secondary | ICD-10-CM | POA: Insufficient documentation

## 2016-05-06 DIAGNOSIS — Z0183 Encounter for blood typing: Secondary | ICD-10-CM | POA: Diagnosis not present

## 2016-05-06 DIAGNOSIS — M48061 Spinal stenosis, lumbar region without neurogenic claudication: Secondary | ICD-10-CM | POA: Diagnosis not present

## 2016-05-06 DIAGNOSIS — Z01812 Encounter for preprocedural laboratory examination: Secondary | ICD-10-CM | POA: Diagnosis not present

## 2016-05-06 HISTORY — DX: Other chronic pain: G89.29

## 2016-05-06 HISTORY — DX: Other muscle spasm: M62.838

## 2016-05-06 HISTORY — DX: Dorsalgia, unspecified: M54.9

## 2016-05-06 HISTORY — DX: Unspecified cataract: H26.9

## 2016-05-06 HISTORY — DX: Polyneuropathy, unspecified: G62.9

## 2016-05-06 HISTORY — DX: Unspecified glaucoma: H40.9

## 2016-05-06 LAB — CBC
HCT: 36.2 % (ref 36.0–46.0)
Hemoglobin: 11.2 g/dL — ABNORMAL LOW (ref 12.0–15.0)
MCH: 23.5 pg — ABNORMAL LOW (ref 26.0–34.0)
MCHC: 30.9 g/dL (ref 30.0–36.0)
MCV: 76.1 fL — AB (ref 78.0–100.0)
PLATELETS: 325 10*3/uL (ref 150–400)
RBC: 4.76 MIL/uL (ref 3.87–5.11)
RDW: 15.7 % — ABNORMAL HIGH (ref 11.5–15.5)
WBC: 5.9 10*3/uL (ref 4.0–10.5)

## 2016-05-06 LAB — BASIC METABOLIC PANEL
ANION GAP: 8 (ref 5–15)
BUN: 7 mg/dL (ref 6–20)
CHLORIDE: 101 mmol/L (ref 101–111)
CO2: 29 mmol/L (ref 22–32)
Calcium: 9.8 mg/dL (ref 8.9–10.3)
Creatinine, Ser: 0.69 mg/dL (ref 0.44–1.00)
GFR calc Af Amer: >60 mL/min (ref >60)
GFR calc non Af Amer: >60 mL/min (ref >60)
Glucose, Bld: 118 mg/dL — ABNORMAL HIGH (ref 65–99)
POTASSIUM: 3.7 mmol/L (ref 3.5–5.1)
SODIUM: 138 mmol/L (ref 135–145)

## 2016-05-06 LAB — TYPE AND SCREEN
ABO/RH(D): O POS
Antibody Screen: NEGATIVE

## 2016-05-06 LAB — GLUCOSE, CAPILLARY: Glucose-Capillary: 134 mg/dL — ABNORMAL HIGH (ref 65–99)

## 2016-05-06 LAB — SURGICAL PCR SCREEN
MRSA, PCR: NEGATIVE
Staphylococcus aureus: NEGATIVE

## 2016-05-06 LAB — ABO/RH: ABO/RH(D): O POS

## 2016-05-06 MED ORDER — CHLORHEXIDINE GLUCONATE CLOTH 2 % EX PADS: 6.0000 | MEDICATED_PAD | Freq: Once | CUTANEOUS | Status: DC

## 2016-05-06 NOTE — Pre-Procedure Instructions (Signed)
[name]  05/06/2016      [id number] - [place], [place] [id number] [place] [id number] Phone: [phone] Fax: [phone]    Your procedure is scheduled on Fri, Feb 2 @ 9:00 AM  Report to [facility] Admitting at 7:00 AM  Call this number if you have problems the morning of surgery:  [phone]   Remember:  Do not eat food or drink liquids after midnight.  Take these medicines the morning of surgery with A SIP OF WATER Baclofen(Lioresal),Eye Drops,Gabapentin(Neurontin)Omeprazole(Prilosec),and Pain Pill(if needed)            Stop taking Multivitamins along with Aspirin or any Herbal Medications. No Goody's,BC's,Aleve,Advil,Motrin,Aspirin,Ibuprofen,or Fish Oil.      How to Manage Your Diabetes Before and After Surgery  Why is it important to control my blood sugar before and after surgery? . Improving blood sugar levels before and after surgery helps healing and can limit problems. . A way of improving blood sugar control is eating a healthy diet by: o  Eating less sugar and carbohydrates o  Increasing activity/exercise o  Talking with your doctor about reaching your blood sugar goals . High blood sugars (greater than 180 mg/dL) can raise your risk of infections and slow your recovery, so you will need to focus on controlling your diabetes during the weeks before surgery. . Make sure that the doctor who takes care of your diabetes knows about your planned surgery including the date and location.  How do I manage my blood sugar before surgery? . Check your blood sugar at least 4 times a day, starting 2 days before surgery, to make sure that the level is not too high or low. o Check your blood sugar the morning of your surgery when you wake up and every 2 hours until you get to the Short Stay unit. . If your blood sugar is less than 70 mg/dL, you will need to treat for low  blood sugar: o Do not take insulin. o Treat a low blood sugar (less than 70 mg/dL) with  cup of clear juice (cranberry or apple), 4 glucose tablets, OR glucose gel. o Recheck blood sugar in 15 minutes after treatment (to make sure it is greater than 70 mg/dL). If your blood sugar is not greater than 70 mg/dL on recheck, call [phone] for further instructions. . Report your blood sugar to the short stay nurse when you get to Short Stay.  . If you are admitted to the hospital after surgery: o Your blood sugar will be checked by the staff and you will probably be given insulin after surgery (instead of oral diabetes medicines) to make sure you have good blood sugar levels. o The goal for blood sugar control after surgery is 80-180 mg/dL.              WHAT DO I DO ABOUT MY DIABETES MEDICATION?   Marland Kitchen Do not take oral diabetes medicines (pills) the morning of surgery.   Reviewed and Endorsed by [facility] Patient Education Committee, August 2015  Do not wear jewelry, make-up or nail polish.  Do not wear lotions, powders, or perfumes, or deoderant.  Do not shave 48 hours prior to surgery.  Men may shave face and neck.  Do not bring valuables to the hospital.  [facility] is not responsible for any belongings or valuables.  Contacts, dentures or bridgework may not be worn  into surgery.  Leave your suitcase in the car.  After surgery it may be brought to your room.  For patients admitted to the hospital, discharge time will be determined by your treatment team.  Patients discharged the day of surgery will not be allowed to drive home.    [facility]  Before surgery, you can play an important role.  Because skin is not sterile, your skin needs to be as free of germs as possible.  You can reduce the number of germs on you skin by washing with CHG (chlorahexidine gluconate) soap before surgery.  CHG is an antiseptic cleaner which kills germs and  bonds with the skin to continue killing germs even after washing.  Please DO NOT use if you have an allergy to CHG or antibacterial soaps.  If your skin becomes reddened/irritated stop using the CHG and inform your nurse when you arrive at Short Stay.  Do not shave (including legs and underarms) for at least 48 hours prior to the first CHG shower.  You may shave your face.  Please follow these instructions carefully:   1.  Shower with CHG Soap the night before surgery and the                                morning of Surgery.  2.  If you choose to wash your hair, wash your hair first as usual with your       normal shampoo.  3.  After you shampoo, rinse your hair and body thoroughly to remove the                      Shampoo.  4.  Use CHG as you would any other liquid soap.  You can apply chg directly       to the skin and wash gently with scrungie or a clean washcloth.  5.  Apply the CHG Soap to your body ONLY FROM THE NECK DOWN.        Do not use on open wounds or open sores.  Avoid contact with your eyes,       ears, mouth and genitals (private parts).  Wash genitals (private parts)       with your normal soap.  6.  Wash thoroughly, paying special attention to the area where your surgery        will be performed.  7.  Thoroughly rinse your body with warm water from the neck down.  8.  DO NOT shower/wash with your normal soap after using and rinsing off       the CHG Soap.  9.  Pat yourself dry with a clean towel.            10.  Wear clean pajamas.            11.  Place clean sheets on your bed the night of your first shower and do not        sleep with pets.  Day of Surgery  Do not apply any lotions/deoderants the morning of surgery.  Please wear clean clothes to the hospital/surgery center.    Please read over the following fact sheets that you were given. Pain Booklet, Coughing and Deep Breathing, MRSA Information and Surgical Site Infection Prevention

## 2016-05-06 NOTE — OR Nursing (Signed)
Cardiologist denies  Medical Md is Dr.Angela Bacigalupo   Echo/stress test/heart cath denies  Denies EKG or CXR in past yr

## 2016-05-10 ENCOUNTER — Encounter (HOSPITAL_COMMUNITY): Payer: Self-pay | Admitting: Anesthesiology

## 2016-05-10 ENCOUNTER — Inpatient Hospital Stay (HOSPITAL_COMMUNITY): Payer: Medicare Other

## 2016-05-10 ENCOUNTER — Inpatient Hospital Stay (HOSPITAL_COMMUNITY): Payer: Medicare Other | Admitting: Anesthesiology

## 2016-05-10 ENCOUNTER — Encounter (HOSPITAL_COMMUNITY)
Admission: RE | Disposition: A | Discharge status: Skilled Nursing Facility | Payer: Self-pay | Source: Ambulatory Visit | Attending: Neurosurgery

## 2016-05-10 ENCOUNTER — Inpatient Hospital Stay (HOSPITAL_COMMUNITY)
Admission: RE | Admit: 2016-05-10 | Discharge: 2016-05-14 | DRG: 460 | Disposition: A | Discharge status: Skilled Nursing Facility | Payer: Medicare Other | Source: Ambulatory Visit | Attending: Neurosurgery | Admitting: Neurosurgery

## 2016-05-10 DIAGNOSIS — M6281 Muscle weakness (generalized): Secondary | ICD-10-CM | POA: Diagnosis not present

## 2016-05-10 DIAGNOSIS — H409 Unspecified glaucoma: Secondary | ICD-10-CM | POA: Diagnosis present

## 2016-05-10 DIAGNOSIS — E1142 Type 2 diabetes mellitus with diabetic polyneuropathy: Secondary | ICD-10-CM | POA: Diagnosis present

## 2016-05-10 DIAGNOSIS — E785 Hyperlipidemia, unspecified: Secondary | ICD-10-CM | POA: Diagnosis not present

## 2016-05-10 DIAGNOSIS — Z7982 Long term (current) use of aspirin: Secondary | ICD-10-CM | POA: Diagnosis not present

## 2016-05-10 DIAGNOSIS — I10 Essential (primary) hypertension: Secondary | ICD-10-CM | POA: Diagnosis not present

## 2016-05-10 DIAGNOSIS — G14 Postpolio syndrome: Secondary | ICD-10-CM | POA: Diagnosis present

## 2016-05-10 DIAGNOSIS — M9963 Osseous and subluxation stenosis of intervertebral foramina of lumbar region: Secondary | ICD-10-CM | POA: Diagnosis not present

## 2016-05-10 DIAGNOSIS — Z7984 Long term (current) use of oral hypoglycemic drugs: Secondary | ICD-10-CM

## 2016-05-10 DIAGNOSIS — R531 Weakness: Secondary | ICD-10-CM | POA: Diagnosis not present

## 2016-05-10 DIAGNOSIS — M4726 Other spondylosis with radiculopathy, lumbar region: Secondary | ICD-10-CM | POA: Diagnosis not present

## 2016-05-10 DIAGNOSIS — Z96652 Presence of left artificial knee joint: Secondary | ICD-10-CM | POA: Diagnosis not present

## 2016-05-10 DIAGNOSIS — E119 Type 2 diabetes mellitus without complications: Secondary | ICD-10-CM | POA: Diagnosis not present

## 2016-05-10 DIAGNOSIS — M48061 Spinal stenosis, lumbar region without neurogenic claudication: Secondary | ICD-10-CM | POA: Diagnosis present

## 2016-05-10 DIAGNOSIS — K219 Gastro-esophageal reflux disease without esophagitis: Secondary | ICD-10-CM | POA: Diagnosis not present

## 2016-05-10 DIAGNOSIS — Z981 Arthrodesis status: Secondary | ICD-10-CM | POA: Diagnosis not present

## 2016-05-10 DIAGNOSIS — M545 Low back pain: Secondary | ICD-10-CM | POA: Diagnosis present

## 2016-05-10 DIAGNOSIS — Z419 Encounter for procedure for purposes other than remedying health state, unspecified: Secondary | ICD-10-CM

## 2016-05-10 DIAGNOSIS — Z79899 Other long term (current) drug therapy: Secondary | ICD-10-CM | POA: Diagnosis not present

## 2016-05-10 DIAGNOSIS — M549 Dorsalgia, unspecified: Secondary | ICD-10-CM | POA: Diagnosis not present

## 2016-05-10 DIAGNOSIS — M4326 Fusion of spine, lumbar region: Secondary | ICD-10-CM | POA: Diagnosis not present

## 2016-05-10 LAB — GLUCOSE, CAPILLARY
GLUCOSE-CAPILLARY: 123 mg/dL — AB (ref 65–99)
GLUCOSE-CAPILLARY: 138 mg/dL — AB (ref 65–99)
GLUCOSE-CAPILLARY: 199 mg/dL — AB (ref 65–99)
Glucose-Capillary: 133 mg/dL — ABNORMAL HIGH (ref 65–99)

## 2016-05-10 LAB — CBC
HCT: 31.6 % — ABNORMAL LOW (ref 36.0–46.0)
HEMOGLOBIN: 9.9 g/dL — AB (ref 12.0–15.0)
MCH: 23.5 pg — ABNORMAL LOW (ref 26.0–34.0)
MCHC: 31.3 g/dL (ref 30.0–36.0)
MCV: 75.1 fL — AB (ref 78.0–100.0)
Platelets: 275 10*3/uL (ref 150–400)
RBC: 4.21 MIL/uL (ref 3.87–5.11)
RDW: 15.7 % — ABNORMAL HIGH (ref 11.5–15.5)
WBC: 8.8 10*3/uL (ref 4.0–10.5)

## 2016-05-10 LAB — CREATININE, SERUM
Creatinine, Ser: 0.85 mg/dL (ref 0.44–1.00)
GFR calc non Af Amer: >60 mL/min (ref >60)

## 2016-05-10 SURGERY — POSTERIOR LUMBAR FUSION 1 LEVEL
Anesthesia: General

## 2016-05-10 MED ORDER — HYDROCHLOROTHIAZIDE 25 MG PO TABS
25.0000 mg | ORAL_TABLET | Freq: Every day | ORAL | Status: DC
  Administered 2016-05-12 – 2016-05-14 (×3): 25 mg via ORAL
  Filled 2016-05-10 (×4): qty 1

## 2016-05-10 MED ORDER — VITAMIN D 1000 UNITS PO TABS
1000.0000 [IU] | ORAL_TABLET | Freq: Every day | ORAL | Status: DC
  Administered 2016-05-11 – 2016-05-14 (×4): 1000 [IU] via ORAL
  Filled 2016-05-10 (×4): qty 1

## 2016-05-10 MED ORDER — LIDOCAINE HCL (CARDIAC) 20 MG/ML IV SOLN
INTRAVENOUS | Status: DC | PRN
  Administered 2016-05-10: 60 mg via INTRAVENOUS

## 2016-05-10 MED ORDER — RAMIPRIL 5 MG PO CAPS
10.0000 mg | ORAL_CAPSULE | Freq: Every day | ORAL | Status: DC
  Administered 2016-05-12 – 2016-05-14 (×3): 10 mg via ORAL
  Filled 2016-05-10 (×4): qty 2

## 2016-05-10 MED ORDER — BUPIVACAINE HCL 0.5 % IJ SOLN
INTRAMUSCULAR | Status: DC | PRN
  Administered 2016-05-10: 30 mL

## 2016-05-10 MED ORDER — ONDANSETRON HCL 4 MG/2ML IJ SOLN: 4.0000 mg | INTRAMUSCULAR | Status: DC | PRN

## 2016-05-10 MED ORDER — ONDANSETRON HCL 4 MG/2ML IJ SOLN
INTRAMUSCULAR | Status: AC
  Filled 2016-05-10: qty 2

## 2016-05-10 MED ORDER — SUGAMMADEX SODIUM 200 MG/2ML IV SOLN
INTRAVENOUS | Status: AC
  Filled 2016-05-10: qty 2

## 2016-05-10 MED ORDER — PROPOFOL 10 MG/ML IV BOLUS
INTRAVENOUS | Status: AC
  Filled 2016-05-10: qty 20

## 2016-05-10 MED ORDER — ACETAMINOPHEN 650 MG RE SUPP: 650.0000 mg | RECTAL | Status: DC | PRN

## 2016-05-10 MED ORDER — ONDANSETRON HCL 4 MG/2ML IJ SOLN
INTRAMUSCULAR | Status: DC | PRN
  Administered 2016-05-10: 4 mg via INTRAVENOUS

## 2016-05-10 MED ORDER — 0.9 % SODIUM CHLORIDE (POUR BTL) OPTIME
TOPICAL | Status: DC | PRN
  Administered 2016-05-10: 1000 mL

## 2016-05-10 MED ORDER — THROMBIN 20000 UNITS EX SOLR
CUTANEOUS | Status: AC
  Filled 2016-05-10: qty 20000

## 2016-05-10 MED ORDER — OXYCODONE HCL 5 MG PO TABS
ORAL_TABLET | ORAL | Status: AC
  Filled 2016-05-10: qty 1

## 2016-05-10 MED ORDER — DORZOLAMIDE HCL 2 % OP SOLN
1.0000 [drp] | Freq: Two times a day (BID) | OPHTHALMIC | Status: DC
  Administered 2016-05-10 – 2016-05-14 (×8): 1 [drp] via OPHTHALMIC
  Filled 2016-05-10: qty 10

## 2016-05-10 MED ORDER — BUPIVACAINE HCL (PF) 0.5 % IJ SOLN
INTRAMUSCULAR | Status: AC
  Filled 2016-05-10: qty 30

## 2016-05-10 MED ORDER — ASPIRIN EC 81 MG PO TBEC
81.0000 mg | DELAYED_RELEASE_TABLET | Freq: Every day | ORAL | Status: DC
  Administered 2016-05-11 – 2016-05-14 (×4): 81 mg via ORAL
  Filled 2016-05-10 (×4): qty 1

## 2016-05-10 MED ORDER — SODIUM CHLORIDE 0.9% FLUSH
3.0000 mL | Freq: Two times a day (BID) | INTRAVENOUS | Status: DC
  Administered 2016-05-10 – 2016-05-14 (×7): 3 mL via INTRAVENOUS

## 2016-05-10 MED ORDER — DEXAMETHASONE SODIUM PHOSPHATE 10 MG/ML IJ SOLN
INTRAMUSCULAR | Status: DC | PRN
  Administered 2016-05-10: 10 mg via INTRAVENOUS

## 2016-05-10 MED ORDER — ADULT MULTIVITAMIN W/MINERALS CH
1.0000 | ORAL_TABLET | Freq: Every day | ORAL | Status: DC
  Administered 2016-05-11 – 2016-05-14 (×4): 1 via ORAL
  Filled 2016-05-10 (×4): qty 1

## 2016-05-10 MED ORDER — LIDOCAINE 2% (20 MG/ML) 5 ML SYRINGE
INTRAMUSCULAR | Status: AC
Start: 2016-05-10 — End: 2016-05-10
  Filled 2016-05-10: qty 5

## 2016-05-10 MED ORDER — CALCIUM CARBONATE 1250 (500 CA) MG PO TABS
1250.0000 mg | ORAL_TABLET | Freq: Every day | ORAL | Status: DC
  Administered 2016-05-11 – 2016-05-14 (×4): 1250 mg via ORAL
  Filled 2016-05-10 (×4): qty 1

## 2016-05-10 MED ORDER — THROMBIN 20000 UNITS EX SOLR
CUTANEOUS | Status: DC | PRN
  Administered 2016-05-10: 09:00:00 via TOPICAL

## 2016-05-10 MED ORDER — HYDROMORPHONE HCL 1 MG/ML IJ SOLN
INTRAMUSCULAR | Status: AC
  Filled 2016-05-10: qty 0.5

## 2016-05-10 MED ORDER — HYDROCODONE-ACETAMINOPHEN 5-325 MG PO TABS
1.0000 | ORAL_TABLET | ORAL | Status: DC | PRN
  Administered 2016-05-13: 2 via ORAL
  Filled 2016-05-10: qty 2

## 2016-05-10 MED ORDER — BRIMONIDINE TARTRATE 0.2 % OP SOLN
1.0000 [drp] | Freq: Two times a day (BID) | OPHTHALMIC | Status: DC
  Administered 2016-05-10 – 2016-05-14 (×6): 1 [drp] via OPHTHALMIC
  Filled 2016-05-10: qty 5

## 2016-05-10 MED ORDER — SENNOSIDES-DOCUSATE SODIUM 8.6-50 MG PO TABS: 1.0000 | ORAL_TABLET | Freq: Every evening | ORAL | Status: DC | PRN

## 2016-05-10 MED ORDER — PHENYLEPHRINE HCL 10 MG/ML IJ SOLN
INTRAMUSCULAR | Status: DC | PRN
  Administered 2016-05-10: 25 ug/min via INTRAVENOUS

## 2016-05-10 MED ORDER — LATANOPROST 0.005 % OP SOLN
1.0000 [drp] | Freq: Every day | OPHTHALMIC | Status: DC
  Administered 2016-05-10 – 2016-05-13 (×4): 1 [drp] via OPHTHALMIC
  Filled 2016-05-10: qty 2.5

## 2016-05-10 MED ORDER — PROPOFOL 10 MG/ML IV BOLUS
INTRAVENOUS | Status: DC | PRN
  Administered 2016-05-10: 120 mg via INTRAVENOUS

## 2016-05-10 MED ORDER — METFORMIN HCL 500 MG PO TABS
500.0000 mg | ORAL_TABLET | Freq: Two times a day (BID) | ORAL | Status: DC
  Administered 2016-05-11 – 2016-05-14 (×8): 500 mg via ORAL
  Filled 2016-05-10 (×8): qty 1

## 2016-05-10 MED ORDER — MAGNESIUM CITRATE PO SOLN
1.0000 | Freq: Once | ORAL | Status: AC | PRN
  Administered 2016-05-14: 1 via ORAL
  Filled 2016-05-10: qty 296

## 2016-05-10 MED ORDER — FENTANYL CITRATE (PF) 100 MCG/2ML IJ SOLN
INTRAMUSCULAR | Status: AC
  Filled 2016-05-10: qty 4

## 2016-05-10 MED ORDER — INSULIN ASPART 100 UNIT/ML ~~LOC~~ SOLN
0.0000 [IU] | Freq: Three times a day (TID) | SUBCUTANEOUS | Status: DC
  Administered 2016-05-11: 4 [IU] via SUBCUTANEOUS

## 2016-05-10 MED ORDER — LIDOCAINE-EPINEPHRINE (PF) 2 %-1:200000 IJ SOLN
INTRAMUSCULAR | Status: AC
  Filled 2016-05-10: qty 20

## 2016-05-10 MED ORDER — ACETAMINOPHEN 325 MG PO TABS: 650.0000 mg | ORAL_TABLET | ORAL | Status: DC | PRN

## 2016-05-10 MED ORDER — ZOLPIDEM TARTRATE 5 MG PO TABS: 5.0000 mg | ORAL_TABLET | Freq: Every evening | ORAL | Status: DC | PRN

## 2016-05-10 MED ORDER — GABAPENTIN 400 MG PO CAPS
1200.0000 mg | ORAL_CAPSULE | Freq: Three times a day (TID) | ORAL | Status: DC
  Administered 2016-05-10 – 2016-05-14 (×12): 1200 mg via ORAL
  Filled 2016-05-10 (×13): qty 3

## 2016-05-10 MED ORDER — BRIMONIDINE TARTRATE-TIMOLOL 0.2-0.5 % OP SOLN: 1.0000 [drp] | Freq: Two times a day (BID) | OPHTHALMIC | Status: DC

## 2016-05-10 MED ORDER — TIMOLOL MALEATE 0.5 % OP SOLN
1.0000 [drp] | Freq: Two times a day (BID) | OPHTHALMIC | Status: DC
  Administered 2016-05-10 – 2016-05-14 (×8): 1 [drp] via OPHTHALMIC
  Filled 2016-05-10: qty 5

## 2016-05-10 MED ORDER — HYDROMORPHONE HCL 1 MG/ML IJ SOLN
0.2500 mg | INTRAMUSCULAR | Status: DC | PRN
  Administered 2016-05-10 (×4): 0.5 mg via INTRAVENOUS

## 2016-05-10 MED ORDER — LIDOCAINE-EPINEPHRINE (PF) 2 %-1:200000 IJ SOLN
INTRAMUSCULAR | Status: DC | PRN
  Administered 2016-05-10: 20 mL via INTRADERMAL

## 2016-05-10 MED ORDER — POTASSIUM CHLORIDE IN NACL 20-0.9 MEQ/L-% IV SOLN
INTRAVENOUS | Status: DC
  Administered 2016-05-10: 80 mL/h via INTRAVENOUS
  Filled 2016-05-10 (×2): qty 1000

## 2016-05-10 MED ORDER — LACTATED RINGERS IV SOLN
INTRAVENOUS | Status: DC
  Administered 2016-05-10 (×2): via INTRAVENOUS
  Administered 2016-05-10: 50 mL/h via INTRAVENOUS

## 2016-05-10 MED ORDER — CEFAZOLIN SODIUM-DEXTROSE 2-4 GM/100ML-% IV SOLN
2.0000 g | INTRAVENOUS | Status: AC
  Administered 2016-05-10: 2 g via INTRAVENOUS
  Filled 2016-05-10: qty 100

## 2016-05-10 MED ORDER — MIDAZOLAM HCL 2 MG/2ML IJ SOLN
INTRAMUSCULAR | Status: AC
  Filled 2016-05-10: qty 2

## 2016-05-10 MED ORDER — SENNA 8.6 MG PO TABS
1.0000 | ORAL_TABLET | Freq: Two times a day (BID) | ORAL | Status: DC
  Administered 2016-05-10 – 2016-05-14 (×8): 8.6 mg via ORAL
  Filled 2016-05-10 (×8): qty 1

## 2016-05-10 MED ORDER — ROCURONIUM BROMIDE 100 MG/10ML IV SOLN
INTRAVENOUS | Status: DC | PRN
  Administered 2016-05-10: 50 mg via INTRAVENOUS

## 2016-05-10 MED ORDER — OXYCODONE HCL 5 MG/5ML PO SOLN: 5.0000 mg | Freq: Once | ORAL | Status: AC | PRN

## 2016-05-10 MED ORDER — DIAZEPAM 5 MG PO TABS
ORAL_TABLET | ORAL | Status: AC
  Filled 2016-05-10: qty 1

## 2016-05-10 MED ORDER — ATORVASTATIN CALCIUM 40 MG PO TABS
40.0000 mg | ORAL_TABLET | Freq: Every day | ORAL | Status: DC
  Administered 2016-05-11 – 2016-05-14 (×4): 40 mg via ORAL
  Filled 2016-05-10 (×4): qty 1

## 2016-05-10 MED ORDER — BISACODYL 5 MG PO TBEC
5.0000 mg | DELAYED_RELEASE_TABLET | Freq: Every day | ORAL | Status: DC | PRN
  Administered 2016-05-13: 5 mg via ORAL
  Filled 2016-05-10: qty 1

## 2016-05-10 MED ORDER — OXYCODONE HCL 5 MG PO TABS
5.0000 mg | ORAL_TABLET | Freq: Once | ORAL | Status: AC | PRN
  Administered 2016-05-10: 5 mg via ORAL

## 2016-05-10 MED ORDER — FENTANYL CITRATE (PF) 100 MCG/2ML IJ SOLN
INTRAMUSCULAR | Status: DC | PRN
  Administered 2016-05-10 (×2): 50 ug via INTRAVENOUS
  Administered 2016-05-10: 100 ug via INTRAVENOUS

## 2016-05-10 MED ORDER — OXYCODONE-ACETAMINOPHEN 5-325 MG PO TABS
1.0000 | ORAL_TABLET | ORAL | Status: DC | PRN
  Administered 2016-05-10 – 2016-05-14 (×18): 2 via ORAL
  Filled 2016-05-10 (×18): qty 2

## 2016-05-10 MED ORDER — BACLOFEN 10 MG PO TABS
10.0000 mg | ORAL_TABLET | Freq: Three times a day (TID) | ORAL | Status: DC
  Administered 2016-05-10 – 2016-05-14 (×12): 10 mg via ORAL
  Filled 2016-05-10 (×12): qty 1

## 2016-05-10 MED ORDER — SODIUM CHLORIDE 0.9 % IV SOLN: 250.0000 mL | INTRAVENOUS | Status: DC

## 2016-05-10 MED ORDER — PHENYLEPHRINE 40 MCG/ML (10ML) SYRINGE FOR IV PUSH (FOR BLOOD PRESSURE SUPPORT)
PREFILLED_SYRINGE | INTRAVENOUS | Status: AC
  Filled 2016-05-10: qty 10

## 2016-05-10 MED ORDER — MENTHOL 3 MG MT LOZG: 1.0000 | LOZENGE | OROMUCOSAL | Status: DC | PRN

## 2016-05-10 MED ORDER — MIDAZOLAM HCL 5 MG/5ML IJ SOLN
INTRAMUSCULAR | Status: DC | PRN
  Administered 2016-05-10: 1 mg via INTRAVENOUS

## 2016-05-10 MED ORDER — HEPARIN SODIUM (PORCINE) 5000 UNIT/ML IJ SOLN
5000.0000 [IU] | Freq: Three times a day (TID) | INTRAMUSCULAR | Status: DC
  Administered 2016-05-12 – 2016-05-14 (×8): 5000 [IU] via SUBCUTANEOUS
  Filled 2016-05-10 (×8): qty 1

## 2016-05-10 MED ORDER — PHENOL 1.4 % MT LIQD: 1.0000 | OROMUCOSAL | Status: DC | PRN

## 2016-05-10 MED ORDER — HYDROMORPHONE HCL 1 MG/ML IJ SOLN
0.5000 mg | INTRAMUSCULAR | Status: DC | PRN
  Administered 2016-05-10 – 2016-05-12 (×5): 1 mg via INTRAVENOUS
  Filled 2016-05-10 (×5): qty 1

## 2016-05-10 MED ORDER — DEXAMETHASONE SODIUM PHOSPHATE 10 MG/ML IJ SOLN
INTRAMUSCULAR | Status: AC
  Filled 2016-05-10: qty 1

## 2016-05-10 MED ORDER — SODIUM CHLORIDE 0.9% FLUSH: 3.0000 mL | INTRAVENOUS | Status: DC | PRN

## 2016-05-10 MED ORDER — SUGAMMADEX SODIUM 200 MG/2ML IV SOLN
INTRAVENOUS | Status: DC | PRN
  Administered 2016-05-10: 400 mg via INTRAVENOUS

## 2016-05-10 MED ORDER — DIAZEPAM 5 MG PO TABS
5.0000 mg | ORAL_TABLET | Freq: Four times a day (QID) | ORAL | Status: DC | PRN
  Administered 2016-05-10 – 2016-05-13 (×7): 5 mg via ORAL
  Filled 2016-05-10 (×6): qty 1

## 2016-05-10 MED ORDER — PANTOPRAZOLE SODIUM 40 MG PO TBEC
40.0000 mg | DELAYED_RELEASE_TABLET | Freq: Every day | ORAL | Status: DC
  Administered 2016-05-11 – 2016-05-14 (×4): 40 mg via ORAL
  Filled 2016-05-10 (×4): qty 1

## 2016-05-10 SURGICAL SUPPLY — 72 items
BASKET BONE COLLECTION (BASKET) ×2 IMPLANT
BENZOIN TINCTURE PRP APPL 2/3 (GAUZE/BANDAGES/DRESSINGS) ×2 IMPLANT
BIT DRILL PLIF MAS DISP 5.5MM (DRILL) ×1 IMPLANT
BLADE CLIPPER SURG (BLADE) IMPLANT
BUR MATCHSTICK NEURO 3.0 LAGG (BURR) ×2 IMPLANT
BUR PRECISION FLUTE 5.0 (BURR) ×2 IMPLANT
CANISTER SUCT 3000ML PPV (MISCELLANEOUS) ×2 IMPLANT
CAP RELINE MOD TULIP RMM (Cap) ×8 IMPLANT
CARTRIDGE OIL MAESTRO DRILL (MISCELLANEOUS) ×1 IMPLANT
CONT SPEC 4OZ CLIKSEAL STRL BL (MISCELLANEOUS) ×2 IMPLANT
COVER BACK TABLE 60X90IN (DRAPES) ×2 IMPLANT
DECANTER SPIKE VIAL GLASS SM (MISCELLANEOUS) ×2 IMPLANT
DERMABOND ADHESIVE PROPEN (GAUZE/BANDAGES/DRESSINGS) ×1
DERMABOND ADVANCED (GAUZE/BANDAGES/DRESSINGS) ×1
DERMABOND ADVANCED .7 DNX12 (GAUZE/BANDAGES/DRESSINGS) ×1 IMPLANT
DERMABOND ADVANCED .7 DNX6 (GAUZE/BANDAGES/DRESSINGS) ×1 IMPLANT
DIFFUSER DRILL AIR PNEUMATIC (MISCELLANEOUS) ×2 IMPLANT
DRAPE C-ARM 42X72 X-RAY (DRAPES) ×2 IMPLANT
DRAPE C-ARMOR (DRAPES) ×2 IMPLANT
DRAPE LAPAROTOMY 100X72X124 (DRAPES) ×2 IMPLANT
DRAPE POUCH INSTRU U-SHP 10X18 (DRAPES) ×2 IMPLANT
DRAPE SURG 17X23 STRL (DRAPES) ×2 IMPLANT
DRILL PLIF MAS DISP 5.5MM (DRILL) ×2
DRSG OPSITE POSTOP 4X6 (GAUZE/BANDAGES/DRESSINGS) ×2 IMPLANT
DRSG OPSITE POSTOP 4X8 (GAUZE/BANDAGES/DRESSINGS) ×2 IMPLANT
DURAPREP 26ML APPLICATOR (WOUND CARE) ×2 IMPLANT
ELECT REM PT RETURN 9FT ADLT (ELECTROSURGICAL) ×2
ELECTRODE REM PT RTRN 9FT ADLT (ELECTROSURGICAL) ×1 IMPLANT
GAUZE SPONGE 4X4 12PLY STRL (GAUZE/BANDAGES/DRESSINGS) IMPLANT
GAUZE SPONGE 4X4 16PLY XRAY LF (GAUZE/BANDAGES/DRESSINGS) IMPLANT
GLOVE BIOGEL PI IND STRL 7.5 (GLOVE) ×1 IMPLANT
GLOVE BIOGEL PI INDICATOR 7.5 (GLOVE) ×1
GLOVE ECLIPSE 6.5 STRL STRAW (GLOVE) ×4 IMPLANT
GLOVE ECLIPSE 7.0 STRL STRAW (GLOVE) ×2 IMPLANT
GLOVE ECLIPSE 7.5 STRL STRAW (GLOVE) ×4 IMPLANT
GLOVE EXAM NITRILE LRG STRL (GLOVE) IMPLANT
GLOVE EXAM NITRILE XL STR (GLOVE) IMPLANT
GLOVE EXAM NITRILE XS STR PU (GLOVE) IMPLANT
GLOVE INDICATOR 7.5 STRL GRN (GLOVE) ×6 IMPLANT
GOWN STRL REUS W/ TWL LRG LVL3 (GOWN DISPOSABLE) ×2 IMPLANT
GOWN STRL REUS W/ TWL XL LVL3 (GOWN DISPOSABLE) IMPLANT
GOWN STRL REUS W/TWL 2XL LVL3 (GOWN DISPOSABLE) IMPLANT
GOWN STRL REUS W/TWL LRG LVL3 (GOWN DISPOSABLE) ×2
GOWN STRL REUS W/TWL XL LVL3 (GOWN DISPOSABLE)
KIT BASIN OR (CUSTOM PROCEDURE TRAY) ×2 IMPLANT
KIT POSITION SURG JACKSON T1 (MISCELLANEOUS) ×2 IMPLANT
KIT ROOM TURNOVER OR (KITS) ×2 IMPLANT
MILL MEDIUM DISP (BLADE) ×2 IMPLANT
NEEDLE HYPO 25X1 1.5 SAFETY (NEEDLE) ×2 IMPLANT
NEEDLE SPNL 18GX3.5 QUINCKE PK (NEEDLE) ×2 IMPLANT
NS IRRIG 1000ML POUR BTL (IV SOLUTION) ×2 IMPLANT
OIL CARTRIDGE MAESTRO DRILL (MISCELLANEOUS) ×2
PACK LAMINECTOMY NEURO (CUSTOM PROCEDURE TRAY) ×2 IMPLANT
PAD ARMBOARD 7.5X6 YLW CONV (MISCELLANEOUS) ×4 IMPLANT
ROD RELINE COCR LORD 5X30 (Rod) ×4 IMPLANT
SCREW LOCK RSS 4.5/5.0MM (Screw) ×8 IMPLANT
SCREW SHANK RELINE MOD 5.5X35 (Screw) ×2 IMPLANT
SHANK RELINE MOD 5.5X40 (Screw) ×6 IMPLANT
SPACER OPAL REVOLVE 10MM ×4 IMPLANT
SPONGE LAP 4X18 X RAY DECT (DISPOSABLE) IMPLANT
SPONGE SURGIFOAM ABS GEL 100 (HEMOSTASIS) ×2 IMPLANT
STRIP CLOSURE SKIN 1/2X4 (GAUZE/BANDAGES/DRESSINGS) ×2 IMPLANT
SUT PROLENE 6 0 BV (SUTURE) IMPLANT
SUT VIC AB 0 CT1 18XCR BRD8 (SUTURE) ×1 IMPLANT
SUT VIC AB 0 CT1 8-18 (SUTURE) ×1
SUT VIC AB 2-0 CT1 18 (SUTURE) ×2 IMPLANT
SUT VIC AB 3-0 SH 8-18 (SUTURE) ×6 IMPLANT
TOWEL OR 17X24 6PK STRL BLUE (TOWEL DISPOSABLE) ×2 IMPLANT
TOWEL OR 17X26 10 PK STRL BLUE (TOWEL DISPOSABLE) ×2 IMPLANT
TRAY FOLEY W/METER SILVER 16FR (SET/KITS/TRAYS/PACK) ×2 IMPLANT
TUBE CONNECTING 12X1/4 (SUCTIONS) ×2 IMPLANT
WATER STERILE IRR 1000ML POUR (IV SOLUTION) ×2 IMPLANT

## 2016-05-10 NOTE — Progress Notes (Signed)
New Admission Note:  Arrival Method: Strecher Mental Orientation:  A & O $  Telemetry: n/a Assessment: Completed Skin: surgical incision IV: L wrist / 29ml/hr Pain: medicated Tubes: Safety Measures: Safety Fall Prevention Plan was given, Admission: Completed 5c18: Patient has been orientated to the room, unit and the staff. Family: visiting  Orders have been reviewed and implemented. Will continue to monitor the patient. Call light has been placed within reach and bed alarm has been activated.   Arta Silence ,RN

## 2016-05-10 NOTE — H&P (Signed)
BP 131/78   Pulse 77   Temp 98.2 F (36.8 C) (Oral)   Resp 20   SpO2 97%  [name] was sent back after injections have no longer been able to deal with the amount of pain that she is having, mainly in the right lower extremity. 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 It is much smaller, significantly weaker.  Prior to Admission medications   Medication Sig Start Date End Date Taking? Authorizing Provider  aspirin 81 MG tablet Take 1 tablet (81 mg total) by mouth daily. Patient taking differently: Take 81 mg by mouth daily after supper.  09/30/11  Yes Deneise Lever, MD  atorvastatin (LIPITOR) 40 MG tablet TAKE 1 TABLET (40 MG TOTAL) BY MOUTH DAILY. Patient taking differently: TAKE 1 TABLET (40 MG TOTAL) BY MOUTH DAILY AFTER SUPPER 06/08/15  Yes Virginia Crews, MD  baclofen (LIORESAL) 10 MG tablet TAKE 0.5 TABLETS (5 MG TOTAL) BY MOUTH 3 (THREE) TIMES DAILY AS NEEDED FOR MUSCLE SPASMS. 03/20/16  Yes Virginia Crews, MD  bimatoprost (LUMIGAN) 0.01 % SOLN Place 1 drop into both eyes at bedtime.   Yes Historical Provider, MD  brimonidine-timolol (COMBIGAN) 0.2-0.5 % ophthalmic solution Place 1 drop into both eyes every 12 (twelve) hours. 1 drop each eye every 12 hours Patient taking differently: Place 1 drop into both eyes every 12 (twelve) hours.  09/30/11  Yes Deneise Lever, MD  dorzolamide (TRUSOPT) 2 % ophthalmic solution Place 1 drop into the right eye 2 (two) times daily.   Yes Historical Provider, MD  gabapentin (NEURONTIN) 400 MG capsule Take 3 capsules (1,200 mg total) by mouth 3 (three) times daily. 03/08/16  Yes Virginia Crews, MD  hydrochlorothiazide (HYDRODIURIL) 25 MG tablet Take 1 tablet (25 mg total) by mouth daily. 08/14/15  Yes Virginia Crews, MD  metFORMIN (GLUCOPHAGE) 500 MG tablet TAKE 1 TABLET (500 MG TOTAL) BY MOUTH 2 (TWO) TIMES DAILY WITH A MEAL. 02/19/16  Yes Virginia Crews, MD  Multiple Vitamin (MULTIVITAMIN WITH  MINERALS) TABS tablet Take 1 tablet by mouth daily.   Yes Historical Provider, MD  omeprazole (PRILOSEC) 20 MG capsule Take 1 capsule (20 mg total) by mouth 2 (two) times daily. 08/14/15  Yes Virginia Crews, MD  Oxycodone HCl 10 MG TABS Take 10 mg by mouth every 6 (six) hours.   Yes Historical Provider, MD  ramipril (ALTACE) 10 MG capsule Take 1 capsule (10 mg total) by mouth daily. 08/14/15  Yes Virginia Crews, MD  calcium carbonate (OS-CAL) 600 MG TABS Take 1 tablet (600 mg total) by mouth daily. 09/30/11   Deneise Lever, MD  cholecalciferol (VITAMIN D) 1000 UNITS tablet Take 1 tablet (1,000 Units total) by mouth daily. 10/14/13   Virginia Crews, MD  oxyCODONE-acetaminophen (PERCOCET/ROXICET) 5-325 MG tablet Take 1 tablet by mouth every 6 (six) hours as needed. Patient not taking: Reported on 05/06/2016 03/08/16   Virginia Crews, MD   Past Medical History:  Diagnosis Date  . Arthritis    osteoarthritis  . Carpal tunnel syndrome on both sides    Followed by Dr. Burney Gauze  . Cataracts, bilateral    immature  . Chronic back pain    stenosis  . DDD (degenerative disc disease)    Lumbar Spondylosis  . Diabetes mellitus    takes Metformin daily  . GERD (gastroesophageal reflux disease)    takes Omeprazole daily  . Glaucoma   . Glaucoma   .  Hyperlipidemia    takes Atorvastatin daily  . Hypertension    takes Ramipril daily as well as HCTZ  . Muscle spasm    takes Baclofen as needed  . Neuromuscular disorder ([place])    Post polio syndrome- polio as a child  . Peripheral neuropathy ([facility])    takes Gabapentin daily  . 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 Vitamin D deficiency    takes Vit D daily   Past Surgical History:  Procedure Laterality Date  . ABDOMINAL HYSTERECTOMY     partial  . BREAST BIOPSY     left-benign  . CHOLECYSTECTOMY    . COLONOSCOPY    . HAND SURGERY     Right wrist, s/p plate removal in Feb 2012  . JOINT REPLACEMENT  2006   left knee  .  left hand surgery     Family History  Problem Relation Age of Onset  . Heart disease Father   . Heart disease Mother   . Heart disease Sister   . Hyperlipidemia Sister   . Hypertension Sister   . Kidney disease Brother    Social History   Social History  . Marital status: Single    Spouse name: N/A  . Number of children: 2  . Years of education: 12   Occupational History  . RetiredPublic librarian Disabled   Social History Main Topics  . Smoking status: Never Smoker  . Smokeless tobacco: Never Used  . Alcohol use No  . Drug use: No  . Sexual activity: No   Other Topics Concern  . Not on file   Social History Narrative   Son [name] POA:    Emergency Contact: daughter [name]  [id number], [phone]   End of Life Plan: gave patient ad pamphlet   Who lives with you: self   Any pets: none   Diet: Pt has a varied diet   Exercise: Pt does not have a regular exercise routine.   Seatbelts: Pt reports wearing seatbelt when in vehicle.   Sun Exposure/Protection: Pt denies using sun protection.   Hobbies: singing in church chior, bible study             PHYSICAL EXAMINATION: On exam she has really just 3/5 strength. She has to lift that leg with her hand and says that she has been doing it for years, at least since the 79's. She says she used to wear a brace on that leg, which many people with polio did.  She is 5 feet 3 inches, weighs 198 pounds, temperature i IMPRESSION/PLAN: My question now is whether or not the pain that she still has in the right lower extremity and very little of in the left lower extremity is actually a result of spondylytic changes seen on an MRI, which she just had done in June. And that shows severe narrowing at 4-5 on the right side, not so severe at 3-4, not quite as bad at 5-1, but 5-1 does have some also. Or is this just from the post-polio syndrome because the left side looks just as bad as the right, but she  has no significant pain in her left lower extremity. So I want that EMG and we will see it afterwards. I just really want to be sure that surgery is going to actually make any difference as opposed to me operating on imaging but not on the patient because it is the post-polio syndrome  causing her discomfort. Ms. Wanko returns today. EMG does show the post-polio factors in her right lower extremity, but that doesn't preclude the fact that the constriction of the nerve root might be causing some pain. Thus I will go ahead and offer her a posterior lumbar interbody arthrodesis so I can truly get that nerve free and clean out the facet and the neural foramen.    IMPRESSION/PLAN: She and I spoke at great length about just doing a simple laminectomy and decompression, but she does not really like the idea of possibly having to do another surgery if that one were ineffective. The risks and benefits, bleeding, infection, nerve release, fusion failure, hardware failure, adjacent segment syndrome were discussed. Bowel and bladder dysfunction was discussed. She does understand and she is willing to proceed. She would like to wait until after the holidays, and we will do that. We will comply with her wishes. We spent approximately 18 minutes face-to-face going over this today.

## 2016-05-10 NOTE — Op Note (Signed)
05/10/2016  1:12 PM  PATIENT:  [name]  71 y.o. 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 Lumbar radiculopathy  PROCEDURE:  Procedure(s):Lumbar laminectomy for decompression of the L4, and L5 roots, beyond the necessary exposure for a PLIF Lumbar four-five Posterior lumbar interbody fusion, synthes cages , morsels of auto and allograft Non segmental pedicle screw fixation L4-5, nuvasive mas plif instrumentation  SURGEON:  Surgeon(s): Ashok Pall, MD Consuella Lose, MD  ASSISTANTS:Nundkumar, neelesh  ANESTHESIA:   general  EBL:  Total I/O In: 1000 [I.V.:1000] Out: 250 [Urine:250]  BLOOD ADMINISTERED:none  CELL SAVER GIVEN:none  COUNT: per nursing  DRAINS: none   SPECIMEN:  No Specimen  DICTATION: [name] is a 71 y.o. female whom was taken to the operating room intubated, and placed under a general anesthetic without difficulty. A foley catheter was placed under sterile conditions. He was positioned prone on a Jackson stable with all pressure points properly padded.  Her lumbar region was prepped and draped in a sterile manner. I infiltrated 20cc's 1/2%lidocaine/1:2000,000 strength epinephrine into the planned incision. I opened the skin with a 10 blade and took the incision down to the thoracolumbar fascia. I exposed the lamina of L4, and L5 in a subperiosteal fashion bilaterally. I confirmed my location with an intraoperative xray.  I placed self retaining retractors and started the decompression.  I decompressed the spinal canal via complete laminectomy of L4 and hemilaminectomy of L4, and L5. I performed inferior facetectomies of L4 bilaterally to expose the L4 nerve roots in the foramina. I exposed the roots and the lateral disc space well beyond the needed exposure for a Plif. I  used the Kerr[place] punches and the drill to remove the bone overlying the disc space. The L5 roots were followed around the L5 pedicles for the decompression. All of the ligament was removed at the L4/5 disc space to create sufficient space for the thecal sac.   PLIF's were performed at L4/5 in the same fashion. I opened the disc space with a 15 blade then used a variety of instruments to remove the disc and prepare the space for the arthrodesis. I used curettes, rongeurs, punches, shavers for the disc space, and rasps in the discetomy. I measured the disc space and placed 10  Peek cages(Synthes) into the disc space(s).   I  placed pedicle screws at L4, and L5, using fluoroscopic guidance. I drilled a pilot hole, then cannulated the pedicle with a drill at each site. I then tapped each pedicle, assessing each site for pedicle violations. No cutouts were appreciated. Screws Harlin Heys) were then placed at each site without difficulty. I attached rods and locking caps with the appropriate tools. The locking caps were secured with torque limited screwdrivers. Final films were performed and the final construct appeared to be in good position.  We closed the wound in a layered fashion. I approximated the thoracolumbar fascia, subcutaneous, and subcuticular planes with vicryl sutures. I used dermabond,and an occlusive for a sterile dressing.     PLAN OF CARE: Admit to inpatient   PATIENT DISPOSITION:  PACU - hemodynamically stable.   Delay start of Pharmacological VTE agent (>24hrs) due to surgical blood loss or risk of bleeding:  yes

## 2016-05-10 NOTE — Anesthesia Preprocedure Evaluation (Signed)
Anesthesia Evaluation  Patient identified by MRN, date of birth, ID band Patient awake    Reviewed: Allergy & Precautions, NPO status , Patient's Chart, lab work & pertinent test results  History of Anesthesia Complications Negative for: history of anesthetic complications  Airway Mallampati: II  TM Distance: >3 FB Neck ROM: Full    Dental  (+) Teeth Intact   Pulmonary neg pulmonary ROS,    breath sounds clear to auscultation       Cardiovascular hypertension, Pt. on medications (-) angina(-) CHF  Rhythm:Regular     Neuro/Psych  Neuromuscular disease negative psych ROS   GI/Hepatic GERD  ,  Endo/Other  diabetes, Type 2Morbid obesity  Renal/GU      Musculoskeletal  (+) Arthritis ,   Abdominal   Peds  Hematology   Anesthesia Other Findings   Reproductive/Obstetrics                             Anesthesia Physical Anesthesia Plan  ASA: II  Anesthesia Plan: General   Post-op Pain Management:    Induction: Intravenous  Airway Management Planned: Oral ETT  Additional Equipment: None  Intra-op Plan:   Post-operative Plan: Extubation in OR  Informed Consent: I have reviewed the patients History and Physical, chart, labs and discussed the procedure including the risks, benefits and alternatives for the proposed anesthesia with the patient or authorized representative who has indicated his/her understanding and acceptance.   Dental advisory given  Plan Discussed with: CRNA and Surgeon  Anesthesia Plan Comments:         Anesthesia Quick Evaluation

## 2016-05-10 NOTE — Anesthesia Postprocedure Evaluation (Addendum)
Anesthesia Post Note  Patient: [name]  Procedure(s) Performed: Procedure(s) (LRB): Lumbar four-five Posterior lumbar interbody fusion (N/A)  Patient location during evaluation: PACU Anesthesia Type: General Level of consciousness: awake and alert Pain management: pain level controlled Vital Signs Assessment: post-procedure vital signs reviewed and stable Respiratory status: spontaneous breathing, nonlabored ventilation, respiratory function stable and patient connected to nasal cannula oxygen Cardiovascular status: blood pressure returned to baseline and stable Postop Assessment: no signs of nausea or vomiting Anesthetic complications: no       Last Vitals:  Vitals:   05/10/16 1700 05/10/16 1715  BP:    Pulse: 66 66  Resp: 17 14  Temp:      Last Pain:  Vitals:   05/10/16 1430  TempSrc:   PainSc: Asleep                 Reign Dziuba

## 2016-05-10 NOTE — Anesthesia Procedure Notes (Signed)
Procedure Name: Intubation Date/Time: 05/10/2016 9:12 AM Performed by: Rebekah Chesterfield L Pre-anesthesia Checklist: Patient identified, Emergency Drugs available, Suction available and Patient being monitored Patient Re-evaluated:Patient Re-evaluated prior to inductionOxygen Delivery Method: Circle System Utilized Preoxygenation: Pre-oxygenation with 100% oxygen Intubation Type: IV induction Ventilation: Mask ventilation without difficulty Laryngoscope Size: Mac and 3 Grade View: Grade I Tube type: Oral Tube size: 7.5 mm Number of attempts: 1 Airway Equipment and Method: Stylet Placement Confirmation: ETT inserted through vocal cords under direct vision,  positive ETCO2 and breath sounds checked- equal and bilateral Secured at: 20 cm Tube secured with: Tape Dental Injury: Teeth and Oropharynx as per pre-operative assessment

## 2016-05-10 NOTE — Transfer of Care (Signed)
Immediate Anesthesia Transfer of Care Note  Patient: [name]  Procedure(s) Performed: Procedure(s): Lumbar four-five Posterior lumbar interbody fusion (N/A)  Patient Location: PACU  Anesthesia Type:General  Level of Consciousness: awake, alert , oriented and patient cooperative  Airway & Oxygen Therapy: Patient Spontanous Breathing and Patient connected to nasal cannula oxygen  Post-op Assessment: Report given to RN, Post -op Vital signs reviewed and stable and Patient moving all extremities  Post vital signs: Reviewed and stable  Last Vitals:  Vitals:   05/10/16 0709  BP: 131/78  Pulse: 77  Resp: 20  Temp: 36.8 C    Last Pain:  Vitals:   05/10/16 0716  TempSrc:   PainSc: 0-No pain      Patients Stated Pain Goal: 4 ([id number])  Complications: No apparent anesthesia complications

## 2016-05-11 LAB — GLUCOSE, CAPILLARY
GLUCOSE-CAPILLARY: 106 mg/dL — AB (ref 65–99)
GLUCOSE-CAPILLARY: 156 mg/dL — AB (ref 65–99)
GLUCOSE-CAPILLARY: 96 mg/dL (ref 65–99)
Glucose-Capillary: 112 mg/dL — ABNORMAL HIGH (ref 65–99)

## 2016-05-11 LAB — HEMOGLOBIN A1C
Hgb A1c MFr Bld: 6.6 % — ABNORMAL HIGH (ref 4.8–5.6)
MEAN PLASMA GLUCOSE: 143 mg/dL

## 2016-05-11 NOTE — Progress Notes (Signed)
Physical Therapy Evaluation Patient Details Name: [name] MRN: [id number] DOB: January 18, 1946 Today's Date: 05/11/2016   History of Present Illness  71 y.o. female with pain in the lower extremities, stenosis and foraminal narrowing.  Post PLIF L4-5 on 05/10/16.  PMHx includes: post polio R LE  Clinical Impression  Pleasant woman with family present on evaluation.  Has heel lift in shoe due to polio, prefers to wear shoes for treatment.  Handout provided and reviewed for safety and precautions.  Emphasis on log-roll technique for bed mobility and don/doff of back brace.  MOD assist bed mobility, MIN assist transfers, MIN Guard for gait on evaluation.  Pain is limiting factor still, but motivated and performed well.  Patient is appropriate for continued PT services, anticipate rapid recovery.    Follow Up Recommendations Home health PT;Supervision for mobility/OOB    Equipment Recommendations  Rolling walker with 5" wheels (Patient reports her walker is "very old" is 5'2" tall.)    Recommendations for Other Services OT consult     Precautions / Restrictions Precautions Precautions: Back Precaution Booklet Issued: Yes (comment) Required Braces or Orthoses: Spinal Brace Spinal Brace: Lumbar corset;Applied in sitting position Restrictions Weight Bearing Restrictions: No      Mobility  Bed Mobility Overal bed mobility: Needs Assistance Bed Mobility: Rolling;Sidelying to Sit;Sit to Sidelying Rolling: Min assist Sidelying to sit: Mod assist     Sit to sidelying: Mod assist General bed mobility comments: Cues for log roll technique, bed flat.  Transfers Overall transfer level: Needs assistance Equipment used: Rolling walker (2 wheeled) Transfers: Sit to/from Stand Sit to Stand: Min assist         General transfer comment: cues for hand placement technique  Ambulation/Gait Ambulation/Gait assistance: Min guard;Supervision Ambulation Distance (Feet): 50 Feet Assistive  device: Rolling walker (2 wheeled) Gait Pattern/deviations: Decreased stride length;Antalgic;Wide base of support Gait velocity: slower pace Gait velocity interpretation: Below normal speed for age/gender    Stairs            Wheelchair Mobility    Modified Rankin (Stroke Patients Only)       Balance Overall balance assessment: Needs assistance   Sitting balance-Leahy Scale: Fair     Standing balance support: Bilateral upper extremity supported Standing balance-Leahy Scale: Poor                               Pertinent Vitals/Pain Pain Assessment: 0-10 Pain Score: 6  Pain Location: Low back Pain Descriptors / Indicators: Sore;Aching Pain Intervention(s): Premedicated before session;Monitored during session    Home Living Family/patient expects to be discharged to:: Private residence Living Arrangements: Alone Available Help at Discharge: Family   Home Access: Level entry     Home Layout: One level Home Equipment: Cane - single point;Walker - 2 wheels Additional Comments: States her walker is "very old"    Prior Function Level of Independence: Independent with assistive device(s)         Comments: Right LE, post polio weakness.     Hand Dominance        Extremity/Trunk Assessment   Upper Extremity Assessment Upper Extremity Assessment: Overall WFL for tasks assessed    Lower Extremity Assessment Lower Extremity Assessment: Overall WFL for tasks assessed (RLE visibly atrophied vs LLE - post polio)       Communication   Communication: No difficulties  Cognition Arousal/Alertness: Awake/alert Behavior During Therapy: WFL for tasks assessed/performed Overall Cognitive Status: Within Functional  Limits for tasks assessed                      General Comments      Exercises General Exercises - Lower Extremity Ankle Circles/Pumps: AROM;Both;10 reps;Supine Heel Slides: AAROM;Both;10 reps;Supine Hip ABduction/ADduction:  AAROM;Both;5 reps;Supine   Assessment/Plan    PT Assessment Patient needs continued PT services  PT Problem List Decreased strength;Decreased activity tolerance;Decreased balance;Decreased mobility;Decreased knowledge of use of DME;Decreased knowledge of precautions;Pain          PT Treatment Interventions DME instruction;Gait training;Functional mobility training;Therapeutic activities;Therapeutic exercise;Balance training;Patient/family education    PT Goals (Current goals can be found in the Care Plan section)  Acute Rehab PT Goals Patient Stated Goal: Stop hurting, get better. PT Goal Formulation: With patient Time For Goal Achievement: 05/25/16 Potential to Achieve Goals: Good    Frequency Min 5X/week   Barriers to discharge Decreased caregiver support      Co-evaluation               End of Session Equipment Utilized During Treatment: Gait belt;Back brace Activity Tolerance: Patient tolerated treatment well;No increased pain Patient left: in bed;with call bell/phone within reach;with family/visitor present;with SCD's reapplied Nurse Communication: Mobility status         Time: [id number] PT Time Calculation (min) (ACUTE ONLY): 35 min   Charges:   PT Evaluation $PT Eval Moderate Complexity: 1 Procedure PT Treatments $Therapeutic Activity: 8-22 mins   PT G Codes:        Shanine Kreiger L 05/14/16, 1:48 PM

## 2016-05-11 NOTE — Progress Notes (Signed)
Orthopedic Tech Progress Note Patient Details:  [name] [birth year] [id number]  Patient ID: [name], female   DOB: [birth year], 71 y.o.   MRN: [id number]   Hildred Priest 05/11/2016, 9:25 AM Called in bio-tech brace order; spoke with Fritz Pickerel

## 2016-05-11 NOTE — Evaluation (Addendum)
Occupational Therapy Evaluation Patient Details Name: [name] MRN: [id number] DOB: 1945/07/23 Today's Date: 05/11/2016    History of Present Illness 71 y.o. female with pain in the lower extremities, stenosis and foraminal narrowing.  Post PLIF L4-5 on 05/10/16.  PMHx includes: post polio R LE   Clinical Impression   Pt admitted with above. She demonstrates the below listed deficits and will benefit from continued OT to maximize safety and independence with BADLs.  Pt demonstrates significant difficulty with LB ADLs due to Rt LE weakness.  Traditional use of adaptive equipment won't be sufficient as she requires both UEs to lift Rt LE off floor and therefore does not allow a free hand to manipulate the equipment and clothing.   She has very limited amount of support at discharge.   Will continue problem solving through adaptations and options to maximize her safety and independence, but am unsure  that she will reach a modified independent level while inpatient.  Depending on her progress next session, she may need to consider SNF level rehab.        Follow Up Recommendations  SNF;Home health OT (depending on progress)   Equipment Recommendations       Recommendations for Other Services       Precautions / Restrictions Precautions Precautions: Back Precaution Booklet Issued: Yes (comment) Precaution Comments: she requires min A to recall back precautions and mod A to don brace  Required Braces or Orthoses: Spinal Brace Spinal Brace: Lumbar corset;Applied in sitting position Restrictions Weight Bearing Restrictions: No      Mobility Bed Mobility Overal bed mobility: Needs Assistance Bed Mobility: Rolling;Sidelying to Sit;Sit to Sidelying Rolling: Min assist Sidelying to sit: Min assist       General bed mobility comments: Pt requires mod cues for log rolling, and assist to bend Rt knee as well as lift trunk   Transfers Overall transfer level: Needs  assistance Equipment used: Rolling walker (2 wheeled) Transfers: Sit to/from Stand Sit to Stand: Min assist         General transfer comment: cues for hand placement and assist to move into standing     Balance Overall balance assessment: Needs assistance   Sitting balance-Leahy Scale: Good     Standing balance support: Bilateral upper extremity supported Standing balance-Leahy Scale: Poor                              ADL Overall ADL's : Needs assistance/impaired Eating/Feeding: Independent   Grooming: Wash/dry hands;Wash/dry face;Oral care;Minimal assistance;Standing   Upper Body Bathing: Supervision/ safety;Sitting   Lower Body Bathing: Moderate assistance;Sit to/from stand   Upper Body Dressing : Supervision/safety;Sitting   Lower Body Dressing: Maximal assistance;Sit to/from stand Lower Body Dressing Details (indicate cue type and reason): Pt requires use of bil hands to lift Rt LE off floor which makes use of AE challenging since she does not have a free hand to manipulate the equipment  Toilet Transfer: Ambulation;Regular Toilet;BSC;RW;Minimal assistance   Toileting- Clothing Manipulation and Hygiene: Sit to/from stand;Minimal assistance       Functional mobility during ADLs: Min guard;Rolling walker;Minimal assistance General ADL Comments: Pt fatigues with activity      Vision     Perception     Praxis      Pertinent Vitals/Pain Pain Assessment: 0-10 Pain Score: 6  Pain Location: Low back Pain Descriptors / Indicators: Sore;Aching Pain Intervention(s): Premedicated before session;Monitored during session  Hand Dominance     Extremity/Trunk Assessment Upper Extremity Assessment Upper Extremity Assessment: Overall WFL for tasks assessed   Lower Extremity Assessment Lower Extremity Assessment: Defer to PT evaluation       Communication Communication Communication: No difficulties   Cognition Arousal/Alertness:  Awake/alert Behavior During Therapy: WFL for tasks assessed/performed Overall Cognitive Status: Within Functional Limits for tasks assessed                     General Comments       Exercises       Shoulder Instructions      Home Living Family/patient expects to be discharged to:: Private residence Living Arrangements: Alone Available Help at Discharge: Family;Available PRN/intermittently   Home Access: Level entry     Home Layout: One level     Bathroom Shower/Tub: Tub/shower unit;Curtain Shower/tub characteristics: Architectural technologist: Standard     Home Equipment: Cane - single point;Walker - 2 wheels   Additional Comments: Pt reports she will only have sporadic assist at discharge       Prior Functioning/Environment Level of Independence: Independent with assistive device(s)        Comments: Right LE, post polio weakness.        OT Problem List: Decreased strength;Decreased activity tolerance;Impaired balance (sitting and/or standing);Decreased safety awareness;Decreased knowledge of use of DME or AE;Decreased knowledge of precautions;Pain   OT Treatment/Interventions: Self-care/ADL training;DME and/or AE instruction;Therapeutic activities;Patient/family education;Balance training    OT Goals(Current goals can be found in the care plan section) Acute Rehab OT Goals Patient Stated Goal: to regain independence  OT Goal Formulation: With patient Time For Goal Achievement: 05/18/16 Potential to Achieve Goals: Good ADL Goals Pt Will Perform Grooming: with modified independence;standing Pt Will Perform Lower Body Bathing: with min assist;with adaptive equipment;sit to/from stand Pt Will Perform Lower Body Dressing: with min assist;with adaptive equipment;sit to/from stand Pt Will Transfer to Toilet: with modified independence;ambulating;bedside commode;regular height toilet;grab bars Pt Will Perform Toileting - Clothing Manipulation and hygiene:  with modified independence;with adaptive equipment;sit to/from stand Pt Will Perform Tub/Shower Transfer: Tub transfer;with min assist;ambulating;shower seat;rolling walker;3 in 1  OT Frequency: Min 2X/week   Barriers to D/C: Decreased caregiver support          Co-evaluation              End of Session Equipment Utilized During Treatment: Back brace;Rolling walker Nurse Communication: Mobility status  Activity Tolerance: Patient tolerated treatment well Patient left: in chair;with call bell/phone within reach;with chair alarm set   Time: 1703-1730 OT Time Calculation (min): 27 min Charges:  OT General Charges $OT Visit: 1 Procedure OT Evaluation $OT Eval Moderate Complexity: 1 Procedure OT Treatments $Self Care/Home Management : 8-22 mins G-Codes:    Hettie Roselli M 06/06/2016, 6:31 PM

## 2016-05-11 NOTE — Progress Notes (Signed)
Subjective: S/P Lumbar laminectomy for decompression of L4 and L5 nerve roots 05/10/2016 No issues overnight Reports pain is manageable with current meds No radicular symptoms Reports no neurological deficits Chronic RLE weakness from polio She is not ready to go home  Objective: Vital signs in last 24 hours: Temp:  [96 F (35.6 C)-98.2 F (36.8 C)] 98.2 F (36.8 C) (02/03 0904) Pulse Rate:  [57-78] 68 (02/03 0904) Resp:  [7-23] 16 (02/03 0904) BP: (108-161)/(50-91) 116/50 (02/03 0905) SpO2:  [97 %-100 %] 98 % (02/03 0904) Weight:  [75.4 kg (166 lb 3.2 oz)-83.3 kg (183 lb 11.2 oz)] 83.3 kg (183 lb 11.2 oz) (02/02 2057)  Intake/Output from previous day: 02/02 0701 - 02/03 0700 In: 3012.7 [P.O.:480; I.V.:2532.7] Out: 2150 [Urine:2000; Blood:150] Intake/Output this shift: Total I/O In: 240 [P.O.:240] Out: -   Physical exam  Awake, alert, oriented  Speech fluent, appropriate  CN grossly intact  Sensation normal 5/5 BUE/LLE - RLE weak as expected due to polio. Wound without evidence of purulent discharge or CSF leak.   Lab Results:  Recent Labs  05/10/16 2059  WBC 8.8  HGB 9.9*  HCT 31.6*  PLT 275   BMET  Recent Labs  05/10/16 2059  CREATININE 0.85    Studies/Results: Dg Lumbar Spine 2-3 Views  Result Date: 05/10/2016 CLINICAL DATA:  Intraoperative imaging for patient undergoing lumbar fusion. EXAM: LUMBAR SPINE - 2-3 VIEW; DG C-ARM 61-120 MIN COMPARISON:  MRI lumbar spine 09/11/2015. FINDINGS: We are provided with 8 fluoroscopic spot views of the lumbar spine. Images demonstrate placement of interbody spacer and pedicle screws at L4-5. No acute abnormality is identified. IMPRESSION: L4-5 discectomy and fusion in progress. Electronically Signed   By: Inge Rise M.D.   On: 05/10/2016 15:43   Dg C-arm 1-60 Min  Result Date: 05/10/2016 CLINICAL DATA:  Intraoperative imaging for patient undergoing lumbar fusion. EXAM: LUMBAR SPINE - 2-3 VIEW; DG C-ARM 61-120  MIN COMPARISON:  MRI lumbar spine 09/11/2015. FINDINGS: We are provided with 8 fluoroscopic spot views of the lumbar spine. Images demonstrate placement of interbody spacer and pedicle screws at L4-5. No acute abnormality is identified. IMPRESSION: L4-5 discectomy and fusion in progress. Electronically Signed   By: Inge Rise M.D.   On: 05/10/2016 15:43    Assessment/Plan: S/P Lumbar laminectomy for decompression of L4 and L5 nerve roots 05/10/2016 due to lumbar foraminal stenosis.  Appears to be doing well this morning Continue current management Estimate Monday for discharge    LOS: 1 day     COSTELLA, VINCENT J 05/11/2016, 9:46 AM

## 2016-05-12 LAB — GLUCOSE, CAPILLARY
GLUCOSE-CAPILLARY: 109 mg/dL — AB (ref 65–99)
Glucose-Capillary: 119 mg/dL — ABNORMAL HIGH (ref 65–99)
Glucose-Capillary: 99 mg/dL (ref 65–99)
Glucose-Capillary: 111 mg/dL — ABNORMAL HIGH (ref 65–99)

## 2016-05-12 MED ORDER — WHITE PETROLATUM GEL
Status: AC
  Filled 2016-05-12: qty 1

## 2016-05-12 NOTE — Progress Notes (Signed)
Pt seen and examined. No issues overnight. No new neurological symptoms Pain is manageable with current meds Chronic RLE weakness from polio She reports she is not ready to go home at this time.  EXAM: Temp:  [98.2 F (36.8 C)-98.7 F (37.1 C)] 98.2 F (36.8 C) (02/04 1000) Pulse Rate:  [74-93] 77 (02/04 1000) Resp:  [16-18] 18 (02/04 1000) BP: (124-162)/(65-73) 136/73 (02/04 1000) SpO2:  [97 %-99 %] 98 % (02/04 1000) Intake/Output      02/03 0701 - 02/04 0700 02/04 0701 - 02/05 0700   P.O. 240 240   I.V. (mL/kg)     Total Intake(mL/kg) 240 (2.9) 240 (2.9)   Urine (mL/kg/hr)     Blood     Total Output       Net +240 +240        Urine Occurrence 3 x 2 x   Stool Occurrence 1 x 0 x    Physical exam  Awake, alert, oriented  Speech fluent, appropriate  CN grossly intact  Sensation normal 5/5 BUE/LLE - RLE weak as expected due to polio. Wound without evidence of purulent discharge or CSF leak.   Assessment/Plan: S/P Lumbar laminectomy for decompression of L4 and L5 nerve roots 05/10/2016 due to lumbar foraminal stenosis.  Appears to be doing well this morning Continue current management Not ready for discharge Estimate tomorrow for discharge

## 2016-05-12 NOTE — Progress Notes (Signed)
Physical Therapy Treatment Patient Details Name: [name] MRN: [id number] DOB: [birth year] Today's Date: 05/12/2016    History of Present Illness 71 y.o. female with pain in the lower extremities, stenosis and foraminal narrowing.  Post PLIF L4-5 on 05/10/16.  PMHx includes: post polio R LE    PT Comments    Pt is making slow, steady progress toward goals. Does continue to need assistance with bed mobility and ADL's (OT addressing this). Pt reports she is now unsure how much assistance she will have at home after discharge. Due to this pt may need ST-SNF for rehab to maximize her independence prior to discharge home. Will continue to monitor this and follow pt's progress.   Follow Up Recommendations  Home health PT;Supervision for mobility/OOB (? need for snf due to decreased assit at home post discharge, TBD)     Equipment Recommendations  Rolling walker with 5" wheels    Recommendations for Other Services OT consult     Precautions / Restrictions Precautions Precautions: Back Precaution Comments: pt able to recall 3/3 back precautions today without cues or assistance Required Braces or Orthoses: Spinal Brace Spinal Brace: Lumbar corset;Applied in sitting position (supervision to don brace at EOB today) Restrictions Weight Bearing Restrictions: No    Mobility  Bed Mobility Overal bed mobility: Needs Assistance Bed Mobility: Rolling;Sidelying to Sit Rolling: Min guard Sidelying to sit: Min assist       General bed mobility comments: with bed flat and no rail used. cues on sequencing and technique. min assist for trunk elevation into seated position at edge of bed. increased time needed.  Transfers Overall transfer level: Needs assistance Equipment used: Rolling walker (2 wheeled) Transfers: Sit to/from Stand Sit to Stand: Min guard;Supervision         General transfer comment: cues for hand placement with standing from bed/elevated toilet in bathroom, demo'd  safe hand placement with sitting to elevated toilet/recliner. increased time needed  Ambulation/Gait Ambulation/Gait assistance: Min guard;Supervision Ambulation Distance (Feet): 120 Feet Assistive device: Rolling walker (2 wheeled) Gait Pattern/deviations: Step-through pattern;Decreased stride length;Antalgic;Wide base of support;Trunk flexed Gait velocity: decreased Gait velocity interpretation: Below normal speed for age/gender General Gait Details: cues for posture, step length and walker position with gait       Balance     Standing balance support: No upper extremity supported Standing balance-Leahy Scale: Fair          Cognition Arousal/Alertness: Awake/alert Behavior During Therapy: WFL for tasks assessed/performed Overall Cognitive Status: Within Functional Limits for tasks assessed           General Comments General comments (skin integrity, edema, etc.): pt supervision to stand without UE support for pericare in bathroom and at sink to wash hands with mild weight shifting in standing. cues to adhere to back precautions, particulary no twisting      Pertinent Vitals/Pain Pain Assessment: 0-10 Pain Score: 7  Pain Location: Low back Pain Descriptors / Indicators: Aching;Sore;Operative site guarding Pain Intervention(s): Limited activity within patient's tolerance;Monitored during session;Premedicated before session;Repositioned;Patient requesting pain meds-RN notified     PT Goals (current goals can now be found in the care plan section) Acute Rehab PT Goals Patient Stated Goal: to regain independence  PT Goal Formulation: With patient Time For Goal Achievement: 05/25/16 Potential to Achieve Goals: Good Progress towards PT goals: Progressing toward goals    Frequency    Min 5X/week      PT Plan Current plan remains appropriate    End of Session Equipment  Utilized During Treatment: Gait belt;Back brace Activity Tolerance: Patient tolerated  treatment well;Patient limited by fatigue;Patient limited by pain Patient left: in bed;with call bell/phone within reach;with chair alarm set     Time: 1122-1150 PT Time Calculation (min) (ACUTE ONLY): 28 min  Charges:  $Gait Training: 8-22 mins $Therapeutic Activity: 8-22 mins           Willow Ora 05/12/2016, 11:59 AM   Willow Ora, PTA, CLT Acute Rehab Services [phone] 05/12/16, 12:02 PM

## 2016-05-12 NOTE — Progress Notes (Signed)
Occupational Therapy Treatment Patient Details Name: [name] MRN: [id number] DOB: Sep 26, 1945 Today's Date: 05/12/2016    History of present illness 71 y.o. female with pain in the lower extremities, stenosis and foraminal narrowing.  Post PLIF L4-5 on 05/10/16.  PMHx includes: post polio R LE   OT comments  This 71 yo female admitted and underwent above presents to acute OT with making progress towards goals. She will continue to benefit from acute OT with follow up OT now recommended at SNF since she will be alone part of the time at home and needs to be at a Mod I to independent level.   Follow Up Recommendations  SNF    Equipment Recommendations  Tub/shower bench       Precautions / Restrictions Precautions Precautions: Back Precaution Comments: pt able to recall 3/3 back precautions today without cues or assistance Required Braces or Orthoses: Spinal Brace Spinal Brace: Lumbar corset;Applied in sitting position Restrictions Weight Bearing Restrictions: No       Mobility Bed Mobility Overal bed mobility: Needs Assistance Bed Mobility: Sit to Sidelying;Rolling Rolling: Min guard (with rail and HOB flat) Sidelying to sit: Min assist     Sit to sidelying: Mod assist (HOB flat and no rail with attempt to use leg lifter--needs to continue to practice technqiue) General bed mobility comments: with bed flat and no rail used. cues on sequencing and technique. min assist for trunk elevation into seated position at edge of bed. increased time needed.  Transfers Overall transfer level: Needs assistance Equipment used: Rolling walker (2 wheeled) Transfers: Sit to/from Stand Sit to Stand: Min guard         General transfer comment: cues for hand placement with standing from bed/elevated toilet in bathroom, demo'd safe hand placement with sitting to elevated toilet/recliner. increased time needed    Balance Overall balance assessment: Needs assistance Sitting-balance  support: No upper extremity supported;Feet unsupported Sitting balance-Leahy Scale: Good     Standing balance support: No upper extremity supported Standing balance-Leahy Scale: Fair                     ADL Overall ADL's : Needs assistance/impaired                       Lower Body Dressing Details (indicate cue type and reason): problem solved with pt to do LBD. She has to turn sideways to her right on the bed and bring her RLE up on the bed with a bent knee and the bed totally supporting the lateral side of her leg--in this position she can don underwear, pants, sock, and shoe on her right side without AE and not breaking back precautions. For her left side she then turns and faces forward and uses AE (reacher, sock aid, and long shoe horn) to don LB clothing.         Toileting - Clothing Manipulation Details (indicate cue type and reason): Introduced toilet and aid and how it works, but have not had pt practice with it yet.       General ADL Comments: Issued pt reacher, sock aid, long shoe horn, long handled sponge, toilet aid, and leg lifter.                Cognition   Behavior During Therapy: WFL for tasks assessed/performed Overall Cognitive Status: Within Functional Limits for tasks assessed  Pertinent Vitals/ Pain       Pain Assessment: 0-10 Pain Score: 8  Pain Location: back Pain Descriptors / Indicators: Aching;Sore;Operative site guarding Pain Intervention(s): Limited activity within patient's tolerance;Monitored during session;Repositioned;Patient requesting pain meds-RN notified;RN gave pain meds during session         Frequency  Min 2X/week        Progress Toward Goals  OT Goals(current goals can now be found in the care plan section)  Progress towards OT goals: Progressing toward goals  Acute Rehab OT Goals Patient Stated Goal: to regain independence   Plan Discharge plan needs to be  updated       End of Session Equipment Utilized During Treatment: Back brace;Rolling walker   Activity Tolerance Patient tolerated treatment well   Patient Left in bed;with call bell/phone within reach;with chair alarm set;with family/visitor present   Nurse Communication          Time: [id number] OT Time Calculation (min): 76 min  Charges: OT General Charges $OT Visit: 1 Procedure OT Treatments $Self Care/Home Management : 68-82 mins  Almon Register [id number] 05/12/2016, 1:48 PM

## 2016-05-13 LAB — GLUCOSE, CAPILLARY
GLUCOSE-CAPILLARY: 105 mg/dL — AB (ref 65–99)
GLUCOSE-CAPILLARY: 114 mg/dL — AB (ref 65–99)
GLUCOSE-CAPILLARY: 93 mg/dL (ref 65–99)
Glucose-Capillary: 118 mg/dL — ABNORMAL HIGH (ref 65–99)

## 2016-05-13 MED ORDER — WHITE PETROLATUM GEL
Status: AC
Start: 2016-05-13 — End: 2016-05-13
  Administered 2016-05-13: 08:00:00
  Filled 2016-05-13: qty 1

## 2016-05-13 MED ORDER — OXYCODONE-ACETAMINOPHEN 5-325 MG PO TABS: 1.0000 | ORAL_TABLET | Freq: Four times a day (QID) | ORAL | 0 refills | Status: DC | PRN

## 2016-05-13 NOTE — Progress Notes (Signed)
Physical Therapy Treatment Patient Details Name: [name] MRN: [id number] DOB: 1945-05-02 Today's Date: 05/13/2016    History of Present Illness 71 y.o. female with pain in the lower extremities, stenosis and foraminal narrowing.  Post PLIF L4-5 on 05/10/16.  PMHx includes: post polio R LE    PT Comments    Patient progressing slowly due to weakness, pain.  Still mobilizing with S to minguard, but needs total assist for shoes, still assist for brace donning and will need initial 24 hour assist due to not yet independent with ADL's.  Recommend STSNF rehab at d/c.   Follow Up Recommendations  SNF;Supervision/Assistance - 24 hour     Equipment Recommendations  Rolling walker with 5" wheels    Recommendations for Other Services       Precautions / Restrictions Precautions Precautions: Back;Fall Required Braces or Orthoses: Spinal Brace Spinal Brace: Lumbar corset;Applied in sitting position (needs set up for brace donning) Restrictions Weight Bearing Restrictions: No    Mobility  Bed Mobility Overal bed mobility: Needs Assistance Bed Mobility: Sit to Sidelying;Rolling Rolling: Supervision (with HOB flat and rail down to sidelying) Sidelying to sit: Min guard     Sit to sidelying: Min guard General bed mobility comments: increased time, some help for R LE into bed, assist for positioning in supine due to R hip pain  Transfers Overall transfer level: Needs assistance Equipment used: Rolling walker (2 wheeled) Transfers: Sit to/from Stand Sit to Stand: Min guard         General transfer comment: assist for balance, safety  Ambulation/Gait Ambulation/Gait assistance: Min guard;Supervision Ambulation Distance (Feet): 140 Feet Assistive device: Rolling walker (2 wheeled) Gait Pattern/deviations: Step-through pattern;Step-to pattern;Decreased stride length;Wide base of support;Decreased step length - left;Decreased stance time - right     General Gait Details:  longstanding weakness R LE with UE support needed for ambulation   Stairs            Wheelchair Mobility    Modified Rankin (Stroke Patients Only)       Balance Overall balance assessment: Needs assistance Sitting-balance support: No upper extremity supported;Feet supported Sitting balance-Leahy Scale: Good     Standing balance support: During functional activity;No upper extremity supported Standing balance-Leahy Scale: Fair Standing balance comment: stood for hygiene after toileting minguard to S                    Cognition Arousal/Alertness: Awake/alert Behavior During Therapy: WFL for tasks assessed/performed Overall Cognitive Status: Within Functional Limits for tasks assessed                      Exercises      General Comments        Pertinent Vitals/Pain Pain Score: 5  (increased with mobility) Pain Location: back Pain Descriptors / Indicators: Aching;Sore;Operative site guarding Pain Intervention(s): Limited activity within patient's tolerance;Monitored during session;Repositioned    Home Living                      Prior Function            PT Goals (current goals can now be found in the care plan section) Progress towards PT goals: Progressing toward goals    Frequency    Min 5X/week      PT Plan Discharge plan needs to be updated    Co-evaluation             End of Session Equipment Utilized During  Treatment: Back brace Activity Tolerance: Patient tolerated treatment well Patient left: in bed;with call bell/phone within reach;with nursing/sitter in room     Time: 1005-1027 PT Time Calculation (min) (ACUTE ONLY): 22 min  Charges:  $Gait Training: 8-22 mins                    G Codes:      [name] 2016-06-05, 10:56 AM  [name], [name] 06-05-16

## 2016-05-13 NOTE — Discharge Summary (Signed)
Physician Discharge Summary  Patient ID: [name] MRN: [id number] DOB/AGE: [birth year] 71 y.o.  Admit date: 05/10/2016 Discharge date: 05/13/2016  Admission Diagnoses:Osteoarthritis of spine with radiculopathy, lumbar region Post polio syndrome  Discharge Diagnoses: same Active Problems:   Osteoarthritis of spine with radiculopathy, lumbar region   Discharged Condition: good  Hospital Course: [name] is a 71 y.o. female Whom was admitted and taken to the operating room for an uncomplicated lumbar decompression and fusion at L4/5, due to foraminal compromise on the left. Post op she is doing better, her wound is clean, dry, and without signs of infection. She is ambulating, and voiding without difficulty. She will be sent to a SNF as she lives alone.   Treatments: surgery: Lumbar laminectomy for decompression of the L4, and L5 roots, beyond the necessary exposure for a PLIF Lumbar four-five Posterior lumbar interbody fusion, synthes cages , morsels of auto and allograft Non segmental pedicle screw fixation L4-5, nuvasive mas plif instrumentation     Discharge Exam: Blood pressure (!) 101/58, pulse 70, temperature 97.9 F (36.6 C), temperature source Oral, resp. rate 18, height 5\' 3"  (1.6 m), weight 83.3 kg (183 lb 11.2 oz), SpO2 98 %. General appearance: alert, cooperative, appears stated age and no distress Neurologic: Motor: at baseline with lower extremities, weak in right lower extremity Incision/Wound:clean, dry, no signs of infection  Disposition: 01-Home or Self Care Lumbar foraminal stenosis  Allergies as of 05/13/2016   No Known Allergies     Medication List    TAKE these medications   aspirin 81 MG tablet Take 1 tablet (81 mg total) by mouth daily. What changed:  when to take this   atorvastatin 40 MG tablet Commonly known as:  LIPITOR TAKE 1 TABLET (40 MG TOTAL) BY MOUTH DAILY. What changed:  See the new instructions.   baclofen 10 MG  tablet Commonly known as:  LIORESAL TAKE 0.5 TABLETS (5 MG TOTAL) BY MOUTH 3 (THREE) TIMES DAILY AS NEEDED FOR MUSCLE SPASMS.   bimatoprost 0.01 % Soln Commonly known as:  LUMIGAN Place 1 drop into both eyes at bedtime.   brimonidine-timolol 0.2-0.5 % ophthalmic solution Commonly known as:  COMBIGAN Place 1 drop into both eyes every 12 (twelve) hours. 1 drop each eye every 12 hours What changed:  additional instructions   calcium carbonate 600 MG Tabs tablet Commonly known as:  OS-CAL Take 1 tablet (600 mg total) by mouth daily.   cholecalciferol 1000 units tablet Commonly known as:  VITAMIN D Take 1 tablet (1,000 Units total) by mouth daily.   dorzolamide 2 % ophthalmic solution Commonly known as:  TRUSOPT Place 1 drop into the right eye 2 (two) times daily.   gabapentin 400 MG capsule Commonly known as:  NEURONTIN Take 3 capsules (1,200 mg total) by mouth 3 (three) times daily.   hydrochlorothiazide 25 MG tablet Commonly known as:  HYDRODIURIL Take 1 tablet (25 mg total) by mouth daily.   metFORMIN 500 MG tablet Commonly known as:  GLUCOPHAGE TAKE 1 TABLET (500 MG TOTAL) BY MOUTH 2 (TWO) TIMES DAILY WITH A MEAL.   multivitamin with minerals Tabs tablet Take 1 tablet by mouth daily.   omeprazole 20 MG capsule Commonly known as:  PRILOSEC Take 1 capsule (20 mg total) by mouth 2 (two) times daily.   Oxycodone HCl 10 MG Tabs Take 10 mg by mouth every 6 (six) hours.   oxyCODONE-acetaminophen 5-325 MG tablet Commonly known as:  PERCOCET/ROXICET Take 1-2 tablets by mouth every 6 (  six) hours as needed. What changed:  how much to take   ramipril 10 MG capsule Commonly known as:  ALTACE Take 1 capsule (10 mg total) by mouth daily.        Signed: Bannie Lobban L 05/13/2016, 6:21 PM

## 2016-05-13 NOTE — NC FL2 (Signed)
[place] MEDICAID FL2 LEVEL OF CARE SCREENING TOOL     IDENTIFICATION  Patient Name: [name] Birthdate: February 09, 1946 Sex: female Admission Date (Current Location): 05/10/2016  [facility] and [place] Number:  Herbalist and Address:  [facility] [place] [address], [place], [place] [address]      Provider Number: [id number]  Attending Physician Name and Address:  Ashok Pall, MD  Relative Name and Phone Number:       Current Level of Care: Hospital Recommended Level of Care: [place] Prior Approval Number:    Date Approved/Denied:   PASRR Number: [id number]  Discharge Plan: SNF    Current Diagnoses: Patient Active Problem List   Diagnosis Date Noted  . Osteoarthritis of spine with radiculopathy, lumbar region 05/10/2016  . Fall 04/22/2016  . GERD (gastroesophageal reflux disease) 10/26/2014  . Healthcare maintenance 05/31/2014  . T2DM (type 2 diabetes mellitus) ([place]) 05/31/2014  . 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 Obesity (BMI 30-39.9) 03/24/2012  . Biceps tendon tear 08/10/2010  . Shoulder pain 08/10/2010  . HTN (hypertension) 08/03/2010  . Hyperlipidemia 08/03/2010  . Glaucoma 08/03/2010  . Radicular low back pain 08/03/2010    Orientation RESPIRATION BLADDER Height & Weight     Self, Time, Situation, Place  Normal Continent Weight: 183 lb 11.2 oz (83.3 kg) Height:  5\' 3"  (160 cm)  BEHAVIORAL SYMPTOMS/MOOD NEUROLOGICAL BOWEL NUTRITION STATUS      Continent Diet (Carb Modified, Thin Liquids)  AMBULATORY STATUS COMMUNICATION OF NEEDS Skin   Limited Assist Verbally Normal, Surgical wounds                       Personal Care Assistance Level of Assistance  Dressing, Bathing, Feeding Bathing Assistance: Limited assistance Feeding assistance: Independent Dressing Assistance: Limited assistance     Functional Limitations Info  Sight, Hearing, Speech Sight Info: Adequate Hearing Info:  Adequate Speech Info: Adequate    SPECIAL CARE FACTORS FREQUENCY  PT (By licensed PT), OT (By licensed OT)     PT Frequency: 5 OT Frequency: 5            Contractures Contractures Info: Not present    Additional Factors Info  Allergies, Code Status Code Status Info: Full Code Allergies Info: No known allergies           Current Medications (05/13/2016):  This is the current hospital active medication list Current Facility-Administered Medications  Medication Dose Route Frequency Provider Last Rate Last Dose  . 0.9 %  sodium chloride infusion  250 mL Intravenous Continuous Ashok Pall, MD      . 0.9 % NaCl with KCl 20 mEq/ L  infusion   Intravenous Continuous Ashok Pall, MD 80 mL/hr at 05/10/16 1842 80 mL/hr at 05/10/16 1842  . acetaminophen (TYLENOL) tablet 650 mg  650 mg Oral Q4H PRN Ashok Pall, MD       Or  . acetaminophen (TYLENOL) suppository 650 mg  650 mg Rectal Q4H PRN Ashok Pall, MD      . aspirin EC tablet 81 mg  81 mg Oral QPC supper Ashok Pall, MD   81 mg at 05/12/16 1713  . atorvastatin (LIPITOR) tablet 40 mg  40 mg Oral q1800 Ashok Pall, MD   40 mg at 05/12/16 1713  . baclofen (LIORESAL) tablet 10 mg  10 mg Oral TID Ashok Pall, MD   10 mg at 05/13/16 1002  . bisacodyl (DULCOLAX) EC tablet 5  mg  5 mg Oral Daily PRN Ashok Pall, MD      . brimonidine (ALPHAGAN) 0.2 % ophthalmic solution 1 drop  1 drop Both Eyes BID Ashok Pall, MD   1 drop at 05/13/16 1000  . calcium carbonate (OS-CAL - dosed in mg of elemental calcium) tablet 1,250 mg  1,250 mg Oral Q breakfast Ashok Pall, MD   1,250 mg at 05/13/16 0820  . cholecalciferol (VITAMIN D) tablet 1,000 Units  1,000 Units Oral Daily Ashok Pall, MD   1,000 Units at 05/13/16 [phone]  . diazepam (VALIUM) tablet 5 mg  5 mg Oral Q6H PRN Ashok Pall, MD   5 mg at 05/13/16 0103  . dorzolamide (TRUSOPT) 2 % ophthalmic solution 1 drop  1 drop Right Eye BID Ashok Pall, MD   1 drop at 05/13/16 1000  . gabapentin  (NEURONTIN) capsule 1,200 mg  1,200 mg Oral TID Ashok Pall, MD   1,200 mg at 05/13/16 1003  . heparin injection 5,000 Units  5,000 Units Subcutaneous Q8H Ashok Pall, MD   5,000 Units at 05/13/16 0520  . hydrochlorothiazide (HYDRODIURIL) tablet 25 mg  25 mg Oral Daily Ashok Pall, MD   25 mg at 05/13/16 1001  . HYDROcodone-acetaminophen (NORCO/VICODIN) 5-325 MG per tablet 1-2 tablet  1-2 tablet Oral Q4H PRN Ashok Pall, MD   2 tablet at 05/13/16 1053  . HYDROmorphone (DILAUDID) injection 0.5-1 mg  0.5-1 mg Intravenous Q2H PRN Ashok Pall, MD   1 mg at 05/12/16 1306  . insulin aspart (novoLOG) injection 0-20 Units  0-20 Units Subcutaneous TID WC Ashok Pall, MD   4 Units at 05/11/16 1739  . latanoprost (XALATAN) 0.005 % ophthalmic solution 1 drop  1 drop Both Eyes QHS Ashok Pall, MD   1 drop at 05/12/16 2015  . magnesium citrate solution 1 Bottle  1 Bottle Oral Once PRN Ashok Pall, MD      . menthol-cetylpyridinium (CEPACOL) lozenge 3 mg  1 lozenge Oral PRN Ashok Pall, MD       Or  . phenol (CHLORASEPTIC) mouth spray 1 spray  1 spray Mouth/Throat PRN Ashok Pall, MD      . metFORMIN (GLUCOPHAGE) tablet 500 mg  500 mg Oral BID WC Ashok Pall, MD   500 mg at 05/13/16 0820  . multivitamin with minerals tablet 1 tablet  1 tablet Oral Daily Ashok Pall, MD   1 tablet at 05/13/16 1006  . ondansetron (ZOFRAN) injection 4 mg  4 mg Intravenous Q4H PRN Ashok Pall, MD      . oxyCODONE-acetaminophen (PERCOCET/ROXICET) 5-325 MG per tablet 1-2 tablet  1-2 tablet Oral Q4H PRN Ashok Pall, MD   2 tablet at 05/13/16 0935  . pantoprazole (PROTONIX) EC tablet 40 mg  40 mg Oral Daily Ashok Pall, MD   40 mg at 05/13/16 1001  . ramipril (ALTACE) capsule 10 mg  10 mg Oral Daily Ashok Pall, MD   10 mg at 05/13/16 1002  . senna (SENOKOT) tablet 8.6 mg  1 tablet Oral BID Ashok Pall, MD   8.6 mg at 05/13/16 0959  . senna-docusate (Senokot-S) tablet 1 tablet  1 tablet Oral QHS PRN Ashok Pall, MD       . sodium chloride flush (NS) 0.9 % injection 3 mL  3 mL Intravenous Q12H Ashok Pall, MD   3 mL at 05/13/16 1007  . sodium chloride flush (NS) 0.9 % injection 3 mL  3 mL Intravenous PRN Ashok Pall, MD      .  timolol (TIMOPTIC) 0.5 % ophthalmic solution 1 drop  1 drop Both Eyes BID Ashok Pall, MD   1 drop at 05/13/16 1011  . zolpidem (AMBIEN) tablet 5 mg  5 mg Oral QHS PRN Ashok Pall, MD         Discharge Medications: Please see discharge summary for a list of discharge medications.  Relevant Imaging Results:  Relevant Lab Results:   Additional Information SSN:  [id number]  Darden Dates, LCSW

## 2016-05-13 NOTE — Consult Note (Signed)
[facility] CM Primary Care Navigator  05/13/2016  [name] [birth year] [id number]  Met with patient at the bedside to identify possible discharge needs. Patient reports having progressive increased lower back pain going down to her right leg that had led to this admission/surgery.  Patient endorses Dr. Lavon Paganini with [facility] as the primary care provider.    Patient shared using [facility] to obtain medications without any problem.   Patient states managing her own medications at home using "pill box" system weekly.   She reports using [facility] transportation to bring her to doctors' appointments since she doesn't drive due to polio.  Patient lives alone. She reports going to a skilled nursing facility (not sure where yet) for rehabilitation to get strengthened before returning back home.    Patient voiced understanding to call primary care provider's office when she gets back home, for a post discharge follow-up appointment within a week or sooner if needs arise. Patient letter (with PCP's contact number) was provided as a reminder.  Patient denies any care coordination needs and disease management at this time.  She states managing DM herself for a long time- taking Metformin and with A1c at 6.2. Patient's primary care provider is helping her manage it as stated.  [facility] care management contact information provided for any futureneeds that may arise.   For additional questions please contact:  Edwena Felty A. Bode Pieper, BSN, RN-BC [facility] PRIMARY CARE Navigator Cell: [phone]

## 2016-05-13 NOTE — Care Management Note (Addendum)
Case Management Note  Patient Details  Name: [name] MRN: [id number] Date of Birth: 03-Sep-1945  Subjective/Objective:       Pt is s/p lumbar laminectomy          Action/Plan:  PTA from home alone.  CM assessed pt and pt stated she was unable to develop a plan with the 24 hour supervision as recommended.  Pt is in agreement for SNF placement.  CSW consulted over the weekend for placement.     Expected Discharge Date:  05/14/16               Expected Discharge Plan:  Skilled Nursing Facility  In-House Referral:  Clinical Social Work  Discharge planning Services     Post Acute Care Choice:    Choice offered to:     DME Arranged:    DME Agency:     HH Arranged:    HH Agency:     Status of Service:  In process, will continue to follow  If discussed at Long Length of Stay Meetings, dates discussed:    Additional Comments:  05/13/2016  CM contacted attending service once in the am and then again during 1400 hour in regards to discharge consideration Maryclare Labrador, RN 05/13/2016, 9:48 AM

## 2016-05-14 DIAGNOSIS — M549 Dorsalgia, unspecified: Secondary | ICD-10-CM | POA: Diagnosis not present

## 2016-05-14 DIAGNOSIS — M9963 Osseous and subluxation stenosis of intervertebral foramina of lumbar region: Secondary | ICD-10-CM | POA: Diagnosis not present

## 2016-05-14 DIAGNOSIS — E785 Hyperlipidemia, unspecified: Secondary | ICD-10-CM | POA: Diagnosis not present

## 2016-05-14 DIAGNOSIS — M6281 Muscle weakness (generalized): Secondary | ICD-10-CM | POA: Diagnosis not present

## 2016-05-14 DIAGNOSIS — R2681 Unsteadiness on feet: Secondary | ICD-10-CM | POA: Diagnosis not present

## 2016-05-14 DIAGNOSIS — M4326 Fusion of spine, lumbar region: Secondary | ICD-10-CM | POA: Diagnosis not present

## 2016-05-14 DIAGNOSIS — E1121 Type 2 diabetes mellitus with diabetic nephropathy: Secondary | ICD-10-CM | POA: Diagnosis not present

## 2016-05-14 DIAGNOSIS — H409 Unspecified glaucoma: Secondary | ICD-10-CM | POA: Diagnosis not present

## 2016-05-14 DIAGNOSIS — R531 Weakness: Secondary | ICD-10-CM | POA: Diagnosis not present

## 2016-05-14 DIAGNOSIS — K219 Gastro-esophageal reflux disease without esophagitis: Secondary | ICD-10-CM | POA: Diagnosis not present

## 2016-05-14 DIAGNOSIS — E119 Type 2 diabetes mellitus without complications: Secondary | ICD-10-CM | POA: Diagnosis not present

## 2016-05-14 DIAGNOSIS — I10 Essential (primary) hypertension: Secondary | ICD-10-CM | POA: Diagnosis not present

## 2016-05-14 DIAGNOSIS — M4726 Other spondylosis with radiculopathy, lumbar region: Secondary | ICD-10-CM | POA: Diagnosis not present

## 2016-05-14 LAB — GLUCOSE, CAPILLARY
GLUCOSE-CAPILLARY: 102 mg/dL — AB (ref 65–99)
Glucose-Capillary: 103 mg/dL — ABNORMAL HIGH (ref 65–99)
Glucose-Capillary: 116 mg/dL — ABNORMAL HIGH (ref 65–99)
Glucose-Capillary: 86 mg/dL (ref 65–99)

## 2016-05-14 NOTE — Care Management Note (Signed)
Case Management Note  Patient Details  Name: [name] MRN: [id number] Date of Birth: 1945/10/09  Subjective/Objective:                    Action/Plan: Pt discharging to [facility] today. No further needs per CM.   Expected Discharge Date:  05/14/16               Expected Discharge Plan:  Skilled Nursing Facility  In-House Referral:  Clinical Social Work  Discharge planning Services     Post Acute Care Choice:    Choice offered to:     DME Arranged:    DME Agency:     HH Arranged:    [place] Agency:     Status of Service:  Completed, signed off  If discussed at [employer], dates discussed:    Additional Comments:  Pollie Friar, RN 05/14/2016, 4:47 PM

## 2016-05-14 NOTE — Clinical Social Work Placement (Signed)
   CLINICAL SOCIAL WORK PLACEMENT  NOTE  Date:  05/14/2016  Patient Details  Name: [name] MRN: [id number] Date of Birth: 01/29/1946  Clinical Social Work is seeking post-discharge placement for this patient at the [place] level of care (*CSW will initial, date and re-position this form in  chart as items are completed):  Yes   Patient/family provided with [facility] Work Department's list of facilities offering this level of care within the geographic area requested by the patient (or if unable, by the patient's family).  Yes   Patient/family informed of their freedom to choose among providers that offer the needed level of care, that participate in Medicare, Medicaid or managed care program needed by the patient, have an available bed and are willing to accept the patient.  Yes   Patient/family informed of [place]'s ownership interest in [facility] and [facility], as well as of the fact that they are under no obligation to receive care at these facilities.  PASRR submitted to EDS on       PASRR number received on       Existing PASRR number confirmed on 05/13/16     FL2 transmitted to all facilities in geographic area requested by pt/family on 05/13/16     FL2 transmitted to all facilities within larger geographic area on       Patient informed that his/her managed care company has contracts with or will negotiate with certain facilities, including the following:        Yes   Patient/family informed of bed offers received.  Patient chooses bed at [facility]     Physician recommends and patient chooses bed at      Patient to be transferred to [facility] on 05/14/16.  Patient to be transferred to facility by [facility]     Patient family notified on 05/14/16 of transfer.  Name of family member notified:        PHYSICIAN       Additional Comment:    _______________________________________________ Truitt Merle,  LCSW 05/14/2016, 5:26 PM

## 2016-05-14 NOTE — Clinical Social Work Note (Addendum)
Pt is ready for discharge and will go to [facility] today. Pt is aware and agreeable to discharge plan. CSW confirmed with [facility] and sent clinicals. CSW called [facility] for transportation. RN provided with room and report. CSW signing off as no further needs identified.    Oretha Ellis, MSW, [place] Social Worker  [phone]

## 2016-05-14 NOTE — Care Management Important Message (Signed)
Important Message  Patient Details  Name: [name] MRN: [id number] Date of Birth: [birth year]   Medicare Important Message Given:  Yes    Leondra Cullin [address] 05/14/2016, 12:56 PM

## 2016-05-14 NOTE — Progress Notes (Signed)
PT Cancellation Note  Patient Details Name: [name] MRN: [id number] DOB: May 25, 1945   Cancelled Treatment:    Reason Eval/Treat Not Completed: Other (comment); patient reports awaiting transfer to SNF this pm.  Just back in bed and up in chair this AM.  Will defer further treatment to SNF.   [name] 05/14/2016, 3:57 PM  [name], [place] 05/14/2016

## 2016-05-14 NOTE — Progress Notes (Signed)
3rd attempt to reach staff unsuccessful

## 2016-05-14 NOTE — Progress Notes (Signed)
Have reached out twice to [address] to give report. Have not made contact yet.

## 2016-05-14 NOTE — Progress Notes (Signed)
[place] to give report. Report given to [facility]. Still awaiting on PTAR for transfer.

## 2016-05-14 NOTE — Clinical Social Work Note (Signed)
Clinical Social Work Assessment  Patient Details  Name: [name] MRN: [id number] Date of Birth: 1946/04/02  Date of referral:  05/13/16               Reason for consult:  Facility Placement                Permission sought to share information with:  Family Supports Permission granted to share information::  Yes, Verbal Permission Granted  Name::     [name]  Agency::     Relationship::  Niece  Contact Information:  [phone]  Housing/Transportation Living arrangements for the past 2 months:  Apartment Source of Information:  Patient Patient Interpreter Needed:  None Criminal Activity/Legal Involvement Pertinent to Current Situation/Hospitalization:  No - Comment as needed Significant Relationships:  Other Family Members Lives with:  Self Do you feel safe going back to the place where you live?  Yes Need for family participation in patient care:  Yes (Comment)  Care giving concerns:  None   Social Worker assessment / plan:  CSW met with pt and niece, [name] [phone] to address consult for new SNF. CSW introduced herself and explained role of Education officer, museum. P/T is recommending STR at SNF. CSW explained process of discharging to SNF. Pt stated preference is [facility]. CSW will follow up on potential bed offer. CSW will continue to follow.    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  Patient/Family's Understanding of and Emotional Response to Diagnosis, Current Treatment, and Prognosis:  Pt and family understand pt will benefit from STR prior to returning home.   Emotional Assessment Appearance:  Appears stated age Attitude/Demeanor/Rapport:   (Appropriate) Affect (typically observed):   Accepting, Adaptable, Pleasant Orientation:  Oriented to Self, Oriented to Place, Oriented to Situation, Oriented to  Time Alcohol / Substance use:  Other Psych involvement (Current and /or in the community):  No (Comment)  Discharge Needs  Concerns to be addressed:  Adjustment to Illness Readmission within the last 30 days:    Current discharge risk:  Chronically ill Barriers to Discharge:  Continued Medical Work up   [employer], [place] 05/14/2016, 4:56 PM

## 2016-05-14 NOTE — Progress Notes (Signed)
PTAR transferred pt to [facility] via stretcher. Alert and oriented. Medicated with Percocet at 2020. Belongings with niece. D/C summary review and verbalized understanding.

## 2016-05-15 ENCOUNTER — Non-Acute Institutional Stay (SKILLED_NURSING_FACILITY): Payer: Medicare Other | Admitting: Adult Health

## 2016-05-15 DIAGNOSIS — E785 Hyperlipidemia, unspecified: Secondary | ICD-10-CM | POA: Diagnosis not present

## 2016-05-15 DIAGNOSIS — I10 Essential (primary) hypertension: Secondary | ICD-10-CM

## 2016-05-15 DIAGNOSIS — K219 Gastro-esophageal reflux disease without esophagitis: Secondary | ICD-10-CM | POA: Diagnosis not present

## 2016-05-15 DIAGNOSIS — M4726 Other spondylosis with radiculopathy, lumbar region: Secondary | ICD-10-CM

## 2016-05-15 DIAGNOSIS — H409 Unspecified glaucoma: Secondary | ICD-10-CM | POA: Diagnosis not present

## 2016-05-15 DIAGNOSIS — K5901 Slow transit constipation: Secondary | ICD-10-CM

## 2016-05-15 DIAGNOSIS — G629 Polyneuropathy, unspecified: Secondary | ICD-10-CM | POA: Diagnosis not present

## 2016-05-15 DIAGNOSIS — E1121 Type 2 diabetes mellitus with diabetic nephropathy: Secondary | ICD-10-CM

## 2016-05-15 DIAGNOSIS — R2681 Unsteadiness on feet: Secondary | ICD-10-CM

## 2016-05-15 DIAGNOSIS — D62 Acute posthemorrhagic anemia: Secondary | ICD-10-CM | POA: Diagnosis not present

## 2016-05-16 ENCOUNTER — Encounter: Payer: Self-pay | Admitting: Adult Health

## 2016-05-16 LAB — CBC AND DIFFERENTIAL
HCT: 33 % — AB (ref 36–46)
Hemoglobin: 10.1 g/dL — AB (ref 12.0–16.0)
NEUTROS ABS: 3 /uL
Platelets: 345 10*3/uL (ref 150–399)
WBC: 5.8 10^3/mL

## 2016-05-16 LAB — BASIC METABOLIC PANEL
BUN: 11 mg/dL (ref 4–21)
Creatinine: 0.7 mg/dL (ref 0.5–1.1)
GLUCOSE: 108 mg/dL
Potassium: 4.3 mmol/L (ref 3.4–5.3)
SODIUM: 141 mmol/L (ref 137–147)

## 2016-05-16 NOTE — Progress Notes (Signed)
DATE:  05/15/2016  MRN:  [id number]  BIRTHDAY: [birth year]  Facility:  Nursing Home Location:  [place] Room Number: 1006-A  LEVEL OF CARE:  SNF [phone])  Contact Information    Name Relation Home Work [place] Daughter [phone]  [phone]   [place] Niece [phone]  [phone]       Code Status History    Date Active Date Inactive Code Status Order ID Comments User Context   05/10/2016  6:16 PM 05/15/2016 12:51 AM Full Code [id number]  Ashok Pall, MD Inpatient       Chief Complaint  Patient presents with  . Hospitalization Follow-up    HISTORY OF PRESENT ILLNESS:  This is a 65-YO female who is for hospital follow-up.  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 She has PMH of post polio syndrome, diabetes mellitus, hyperlipidemia, peripheral neuropathy, hypertension, glaucoma, GERD, and chronic back pain.  She was seen in the room today and complained of constipation. She said that her pain is well controlled, 4/10.   PAST MEDICAL HISTORY:  Past Medical History:  Diagnosis Date  . Arthritis    osteoarthritis  . Carpal tunnel syndrome on both sides    Followed by Dr. Burney Gauze  . Cataracts, bilateral    immature  . Chronic back pain    stenosis  . DDD (degenerative disc disease)    Lumbar Spondylosis  . Diabetes mellitus    takes Metformin daily  . GERD (gastroesophageal reflux disease)    takes Omeprazole daily  . Glaucoma   . Hyperlipidemia    takes Atorvastatin daily  . Hypertension    takes Ramipril daily as well as HCTZ  . Muscle spasm    takes Baclofen as needed  . 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 Peripheral neuropathy (HCC)     takes Gabapentin daily  . 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 Vitamin D deficiency    takes Vit D daily     CURRENT MEDICATIONS: Reviewed  Patient's Medications  New Prescriptions   No medications on file  Previous Medications   ASPIRIN 81 MG TABLET    Take 1 tablet (81 mg total) by mouth daily.   ATORVASTATIN (LIPITOR) 40 MG TABLET    TAKE 1 TABLET (40 MG TOTAL) BY MOUTH DAILY.   BACLOFEN (LIORESAL) 10 MG TABLET    TAKE 0.5 TABLETS (5 MG TOTAL) BY MOUTH 3 (THREE) TIMES DAILY AS NEEDED FOR MUSCLE SPASMS.   BIMATOPROST (LUMIGAN) 0.01 % SOLN    Place 1 drop into both eyes at bedtime.    BRIMONIDINE-TIMOLOL (COMBIGAN) 0.2-0.5 % OPHTHALMIC SOLUTION    Place 1 drop into both eyes every 12 (twelve) hours. 1 drop each eye every 12 hours   CALCIUM CARBONATE (OS-CAL) 600 MG TABS    Take 1 tablet (600 mg total) by mouth daily.   CHOLECALCIFEROL (VITAMIN D) 1000 UNITS TABLET    Take 1 tablet (1,000 Units total) by mouth daily.   DORZOLAMIDE (TRUSOPT) 2 % OPHTHALMIC SOLUTION    Place 1 drop into the right eye 2 (two) times daily.   GABAPENTIN (NEURONTIN) 400 MG CAPSULE    Take 3 capsules (1,200 mg total) by mouth  3 (three) times daily.   HYDROCHLOROTHIAZIDE (HYDRODIURIL) 25 MG TABLET    Take 1 tablet (25 mg total) by mouth daily.   METFORMIN (GLUCOPHAGE) 500 MG TABLET    TAKE 1 TABLET (500 MG TOTAL) BY MOUTH 2 (TWO) TIMES DAILY WITH A MEAL.   MULTIPLE VITAMIN (MULTIVITAMIN WITH MINERALS) TABS TABLET    Take 1 tablet by mouth daily.   OMEPRAZOLE (PRILOSEC) 20 MG CAPSULE    Take 1 capsule (20 mg total) by mouth 2 (two) times daily.   OXYCODONE-ACETAMINOPHEN (PERCOCET/ROXICET) 5-325 MG TABLET    Take 1-2 tablets by mouth every 6 (six) hours as needed.   RAMIPRIL (ALTACE) 10 MG CAPSULE    Take 1 capsule (10 mg total) by mouth daily.   SENNA (SENOKOT) 8.6 MG TABS TABLET    Take 2 tablets by mouth 2 (two) times daily.  Modified Medications   No medications on file  Discontinued Medications    OXYCODONE HCL 10 MG TABS    Take 10 mg by mouth every 6 (six) hours.     No Known Allergies   REVIEW OF SYSTEMS:  GENERAL: no change in appetite, no fatigue, no weight changes, no fever, chills or weakness EYES: Denies change in vision, dry eyes, eye pain, itching or discharge EARS: Denies change in hearing, ringing in ears, or earache NOSE: Denies nasal congestion or epistaxis MOUTH and THROAT: Denies oral discomfort, gingival pain or bleeding, pain from teeth or hoarseness   RESPIRATORY: no cough, SOB, DOE, wheezing, hemoptysis CARDIAC: no chest pain, edema or palpitations GI: no abdominal pain, diarrhea, heart burn, nausea or vomiting, +constipation GU: Denies dysuria, frequency, hematuria, incontinence, or discharge PSYCHIATRIC: Denies feeling of depression or anxiety. No report of hallucinations, insomnia, paranoia, or agitation    PHYSICAL EXAMINATION  GENERAL APPEARANCE: Well nourished. In no acute distress. Obese SKIN:  Mid lower back surgical incision has honeycomb dressing, dry and no erythema HEAD: Normal in size and contour. No evidence of trauma EYES: Lids open and close normally. No blepharitis, entropion or ectropion. PERRL. Conjunctivae are clear and sclerae are white. Lenses are without opacity EARS: Pinnae are normal. Patient hears normal voice tunes of the examiner MOUTH and THROAT: Lips are without lesions. Oral mucosa is moist and without lesions. Tongue is normal in shape, size, and color and without lesions NECK: supple, trachea midline, no neck masses, no thyroid tenderness, no thyromegaly LYMPHATICS: no LAN in the neck, no supraclavicular LAN RESPIRATORY: breathing is even & unlabored, BS CTAB CARDIAC: RRR, no murmur,no extra heart sounds, no edema GI: abdomen soft, normal BS, no masses, no tenderness, no hepatomegaly, no splenomegaly EXTREMITIES:  Able to move X 4 extremities; right leg is shorter than left due to post polio syndrome PSYCHIATRIC: Alert  and oriented X 3. Affect and behavior are appropriate   LABS/RADIOLOGY: Labs reviewed: Basic Metabolic Panel:  Recent Labs  08/14/15 1059 03/08/16 0937 05/06/16 1041 05/10/16 2059  NA 139 139 138  --   K 4.2 4.1 3.7  --   CL 103 102 101  --   CO2 27 28 29   --   GLUCOSE 111* 116* 118*  --   BUN 12 11 7   --   CREATININE 0.73 0.82 0.69 0.85  CALCIUM 9.4 9.6 9.8  --    Liver Function Tests:  Recent Labs  03/08/16 0937  AST 12  ALT 13  ALKPHOS 54  BILITOT 0.6  PROT 6.5  ALBUMIN 4.0   CBC:  Recent Labs  05/06/16 1041 05/10/16 2059  WBC 5.9 8.8  HGB 11.2* 9.9*  HCT 36.2 31.6*  MCV 76.1* 75.1*  PLT 325 275   Lipid Panel:  Recent Labs  03/08/16 0937  HDL 65   CBG:  Recent Labs  05/14/16 1128 05/14/16 1605 05/14/16 2137  GLUCAP 116* 86 102*      Dg Lumbar Spine 2-3 Views  Result Date: 05/10/2016 CLINICAL DATA:  Intraoperative imaging for patient undergoing lumbar fusion. EXAM: LUMBAR SPINE - 2-3 VIEW; DG C-ARM 61-120 MIN COMPARISON:  MRI lumbar spine 09/11/2015. FINDINGS: We are provided with 8 fluoroscopic spot views of the lumbar spine. Images demonstrate placement of interbody spacer and pedicle screws at L4-5. No acute abnormality is identified. IMPRESSION: L4-5 discectomy and fusion in progress. Electronically Signed   By: Inge Rise M.D.   On: 05/10/2016 15:43   Dg C-arm 1-60 Min  Result Date: 05/10/2016 CLINICAL DATA:  Intraoperative imaging for patient undergoing lumbar fusion. EXAM: LUMBAR SPINE - 2-3 VIEW; DG C-ARM 61-120 MIN COMPARISON:  MRI lumbar spine 09/11/2015. FINDINGS: We are provided with 8 fluoroscopic spot views of the lumbar spine. Images demonstrate placement of interbody spacer and pedicle screws at L4-5. No acute abnormality is identified. IMPRESSION: L4-5 discectomy and fusion in progress. Electronically Signed   By: Inge Rise M.D.   On: 05/10/2016 15:43    ASSESSMENT/PLAN:  Unsteady gait - for rehabilitation, PT  and OT, for therapeutic strengthening exercises; fall precautions  Osteoarthritis of spine with radiculopathy, lumbar region - S/P lumbar laminectomy for decompression of the L4 and L5 roots; follow-up with Dr. Christella Noa, neurosurgeon, in 3 weeks; change to oxycodone 10 mg 1 tab by mouth every 6 hours to when necessary and continue Percocet 5/325 mg 1-2 tabs by mouth every 6 hours when necessary for pain; baclofen 10 mg give 1/2 tab = 5 mg PO TID PRN for muscle spasm  Diabetes mellitus, type II - continue metformin 500 mg 1 tab by mouth twice a day and CBG twice a day Lab Results  Component Value Date   HGBA1C 6.6 (H) 05/10/2016   Glaucoma - continue Trusopt 2% one drop into right eye twice a day, Lumigan 0.01% 1 drop into both eyes daily at bedtime and Combigan 0.2%-0.5% eyedrops 1 drop into both eyes every 12 hours  Neuropathy - continue gabapentin 400 mg take 3 capsules = 1200 mg by mouth 3 times a day  Hyperlipidemia - continue atorvastatin 40 mg 1 tab by mouth daily  Hypertension - continue hydrochlorothiazide 25 mg 1 tab by mouth daily and ramipril 10 mg 1 capsule by mouth daily; check BMP   GERD - continue omeprazole 20 mg 1 capsule by mouth twice a day   Constipation - start senna S 2 tabs by mouth twice a day  Acute blood loss anemia - hgb 9.9,  Re-check CBC     Goals of care:  Short-term rehabilitation    Shawntae Lowy C. [facility] [phone]

## 2016-05-24 ENCOUNTER — Encounter: Payer: Self-pay | Admitting: Adult Health

## 2016-05-24 ENCOUNTER — Non-Acute Institutional Stay (SKILLED_NURSING_FACILITY): Payer: Medicare Other | Admitting: Adult Health

## 2016-05-24 DIAGNOSIS — H409 Unspecified glaucoma: Secondary | ICD-10-CM

## 2016-05-24 DIAGNOSIS — G629 Polyneuropathy, unspecified: Secondary | ICD-10-CM

## 2016-05-24 DIAGNOSIS — K5901 Slow transit constipation: Secondary | ICD-10-CM

## 2016-05-24 DIAGNOSIS — D62 Acute posthemorrhagic anemia: Secondary | ICD-10-CM

## 2016-05-24 DIAGNOSIS — K219 Gastro-esophageal reflux disease without esophagitis: Secondary | ICD-10-CM

## 2016-05-24 DIAGNOSIS — E785 Hyperlipidemia, unspecified: Secondary | ICD-10-CM

## 2016-05-24 DIAGNOSIS — E1121 Type 2 diabetes mellitus with diabetic nephropathy: Secondary | ICD-10-CM | POA: Diagnosis not present

## 2016-05-24 DIAGNOSIS — R2681 Unsteadiness on feet: Secondary | ICD-10-CM | POA: Diagnosis not present

## 2016-05-24 DIAGNOSIS — I10 Essential (primary) hypertension: Secondary | ICD-10-CM | POA: Diagnosis not present

## 2016-05-24 DIAGNOSIS — M4726 Other spondylosis with radiculopathy, lumbar region: Secondary | ICD-10-CM

## 2016-05-24 NOTE — Progress Notes (Signed)
Patient ID: [name], female   DOB: June 15, 1945, 71 y.o.   MRN: [id number]    DATE:  05/24/2016  MRN:  [id number]  BIRTHDAY: 1945-05-10  Facility:  Nursing Home Location:  [place] Room Number: 1006-A  LEVEL OF CARE:  SNF [phone])  Contact Information    Name Relation Home Work [place] Daughter [phone]  [phone]   [place] Niece [phone]  [phone]       Code Status History    Date Active Date Inactive Code Status Order ID Comments User Context   05/10/2016  6:16 PM 05/15/2016 12:51 AM Full Code [id number]  Ashok Pall, MD Inpatient       Chief Complaint  Patient presents with  . Discharge Note    HISTORY OF PRESENT ILLNESS:  This is a 71-YO female who is for discharge home with Home health PT, OT, CNA, SW and Nursing.  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 She has PMH of post polio syndrome, diabetes mellitus, hyperlipidemia, peripheral neuropathy, hypertension, glaucoma, GERD, and chronic back pain.  Patient was admitted to this facility for short-term rehabilitation after the patient's recent hospitalization.  Patient has completed SNF rehabilitation and therapy has cleared the patient for discharge.   PAST MEDICAL HISTORY:  Past Medical History:  Diagnosis Date  . Arthritis    osteoarthritis  . Carpal tunnel syndrome on both sides    Followed by Dr. Burney Gauze  . Cataracts, bilateral    immature  . Chronic back pain    stenosis  . DDD (degenerative disc disease)    Lumbar Spondylosis  . Diabetes mellitus    takes Metformin daily  . GERD (gastroesophageal reflux disease)    takes Omeprazole daily  . Glaucoma   . Hyperlipidemia    takes Atorvastatin daily   . Hypertension    takes Ramipril daily as well as HCTZ  . Muscle spasm    takes Baclofen as needed  . Neuromuscular disorder ([place])    Post polio syndrome- polio as a child  . Peripheral neuropathy (HCC)    takes Gabapentin daily  . 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 Vitamin D deficiency    takes Vit D daily     CURRENT MEDICATIONS: Reviewed  Patient's Medications  New Prescriptions   No medications on file  Previous Medications   ASPIRIN 81 MG TABLET    Take 1 tablet (81 mg total) by mouth daily.   ATORVASTATIN (LIPITOR) 40 MG TABLET    TAKE 1 TABLET (40 MG TOTAL) BY MOUTH DAILY.   BACLOFEN (LIORESAL) 10 MG TABLET    TAKE 0.5 TABLETS (5 MG TOTAL) BY MOUTH 3 (THREE) TIMES DAILY AS NEEDED FOR MUSCLE SPASMS.   BRIMONIDINE-TIMOLOL (COMBIGAN) 0.2-0.5 % OPHTHALMIC SOLUTION    Place 1 drop into both eyes every 12 (twelve) hours. 1 drop each eye every 12 hours   CALCIUM CARBONATE (OS-CAL) 600 MG TABS    Take 1 tablet (600 mg total) by mouth daily.   CHOLECALCIFEROL (VITAMIN D) 1000 UNITS TABLET    Take 1 tablet (1,000 Units total) by mouth daily.   DORZOLAMIDE (TRUSOPT) 2 % OPHTHALMIC SOLUTION    Place 1 drop into the right eye 2 (two) times daily.   GABAPENTIN (NEURONTIN)  400 MG CAPSULE    Take 3 capsules (1,200 mg total) by mouth 3 (three) times daily.   HYDROCHLOROTHIAZIDE (HYDRODIURIL) 25 MG TABLET    Take 1 tablet (25 mg total) by mouth daily.   LATANOPROST (XALATAN) 0.005 % OPHTHALMIC SOLUTION    Place 1 drop into both eyes at bedtime.   LISINOPRIL (PRINIVIL,ZESTRIL) 40 MG TABLET    Take 40 mg by mouth daily.   METFORMIN (GLUCOPHAGE) 500 MG TABLET    TAKE 1 TABLET (500 MG TOTAL) BY MOUTH 2 (TWO) TIMES DAILY WITH A MEAL.   MULTIPLE VITAMIN (MULTIVITAMIN WITH MINERALS) TABS TABLET    Take 1 tablet by mouth daily.   OMEPRAZOLE (PRILOSEC) 20 MG CAPSULE    Take 1 capsule (20 mg total) by mouth 2 (two) times daily.   OXYCODONE HCL 10 MG TABS    Take 10 mg by mouth every 6 (six)  hours as needed.   OXYCODONE-ACETAMINOPHEN (PERCOCET/ROXICET) 5-325 MG TABLET    Take 1-2 tablets by mouth every 6 (six) hours as needed.   SENNOSIDES-DOCUSATE SODIUM (SENOKOT-S) 8.6-50 MG TABLET    Take 2 tablets by mouth 2 (two) times daily.  Modified Medications   No medications on file  Discontinued Medications   BIMATOPROST (LUMIGAN) 0.01 % SOLN    Place 1 drop into both eyes at bedtime.    RAMIPRIL (ALTACE) 10 MG CAPSULE    Take 1 capsule (10 mg total) by mouth daily.     No Known Allergies   REVIEW OF SYSTEMS:  GENERAL: no change in appetite, no fatigue, no weight changes, no fever, chills or weakness EYES: Denies change in vision, dry eyes, eye pain, itching or discharge EARS: Denies change in hearing, ringing in ears, or earache NOSE: Denies nasal congestion or epistaxis MOUTH and THROAT: Denies oral discomfort, gingival pain or bleeding, pain from teeth or hoarseness   RESPIRATORY: no cough, SOB, DOE, wheezing, hemoptysis CARDIAC: no chest pain, edema or palpitations GI: no abdominal pain, diarrhea, heart burn, nausea or vomiting GU: Denies dysuria, frequency, hematuria, incontinence, or discharge PSYCHIATRIC: Denies feeling of depression or anxiety. No report of hallucinations, insomnia, paranoia, or agitation    PHYSICAL EXAMINATION  GENERAL APPEARANCE: Well nourished. In no acute distress. Obese SKIN:  Mid lower back surgical incision is dry and no erythema HEAD: Normal in size and contour. No evidence of trauma EYES: Lids open and close normally. No blepharitis, entropion or ectropion. PERRL. Conjunctivae are clear and sclerae are white. Lenses are without opacity EARS: Pinnae are normal. Patient hears normal voice tunes of the examiner MOUTH and THROAT: Lips are without lesions. Oral mucosa is moist and without lesions. Tongue is normal in shape, size, and color and without lesions NECK: supple, trachea midline, no neck masses, no thyroid tenderness, no  thyromegaly LYMPHATICS: no LAN in the neck, no supraclavicular LAN RESPIRATORY: breathing is even & unlabored, BS CTAB CARDIAC: RRR, no murmur,no extra heart sounds, no edema GI: abdomen soft, normal BS, no masses, no tenderness, no hepatomegaly, no splenomegaly EXTREMITIES:  Able to move X 4 extremities; right leg is shorter than left due to post polio syndrome PSYCHIATRIC: Alert and oriented X 3. Affect and behavior are appropriate   LABS/RADIOLOGY: Labs reviewed: Basic Metabolic Panel:  Recent Labs  08/14/15 1059 03/08/16 0937 05/06/16 1041 05/10/16 2059 05/16/16  NA 139 139 138  --  141  K 4.2 4.1 3.7  --  4.3  CL 103 102 101  --   --  CO2 27 28 29   --   --   GLUCOSE 111* 116* 118*  --   --   BUN 12 11 7   --  11  CREATININE 0.73 0.82 0.69 0.85 0.7  CALCIUM 9.4 9.6 9.8  --   --    Liver Function Tests:  Recent Labs  03/08/16 0937  AST 12  ALT 13  ALKPHOS 54  BILITOT 0.6  PROT 6.5  ALBUMIN 4.0   CBC:  Recent Labs  05/06/16 1041 05/10/16 2059 05/16/16  WBC 5.9 8.8 5.8  NEUTROABS  --   --  3  HGB 11.2* 9.9* 10.1*  HCT 36.2 31.6* 33*  MCV 76.1* 75.1*  --   PLT 325 275 345   Lipid Panel:  Recent Labs  03/08/16 0937  HDL 65   CBG:  Recent Labs  05/14/16 1128 05/14/16 1605 05/14/16 2137  GLUCAP 116* 86 102*      Dg Lumbar Spine 2-3 Views  Result Date: 05/10/2016 CLINICAL DATA:  Intraoperative imaging for patient undergoing lumbar fusion. EXAM: LUMBAR SPINE - 2-3 VIEW; DG C-ARM 61-120 MIN COMPARISON:  MRI lumbar spine 09/11/2015. FINDINGS: We are provided with 8 fluoroscopic spot views of the lumbar spine. Images demonstrate placement of interbody spacer and pedicle screws at L4-5. No acute abnormality is identified. IMPRESSION: L4-5 discectomy and fusion in progress. Electronically Signed   By: Inge Rise M.D.   On: 05/10/2016 15:43   Dg C-arm 1-60 Min  Result Date: 05/10/2016 CLINICAL DATA:  Intraoperative imaging for patient undergoing  lumbar fusion. EXAM: LUMBAR SPINE - 2-3 VIEW; DG C-ARM 61-120 MIN COMPARISON:  MRI lumbar spine 09/11/2015. FINDINGS: We are provided with 8 fluoroscopic spot views of the lumbar spine. Images demonstrate placement of interbody spacer and pedicle screws at L4-5. No acute abnormality is identified. IMPRESSION: L4-5 discectomy and fusion in progress. Electronically Signed   By: Inge Rise M.D.   On: 05/10/2016 15:43    ASSESSMENT/PLAN:  Unsteady gait - for Home health PT and OT, for therapeutic strengthening exercises; fall precautions  Osteoarthritis of spine with radiculopathy, lumbar region - S/P lumbar laminectomy for decompression of the L4 and L5 roots; follow-up with Dr. Christella Noa, neurosurgeon, in 1 week; continue oxycodone 10 mg 1 tab by mouth every 6 hours  when necessary and  Percocet 5/325 mg 1-2 tabs by mouth every 6 hours when necessary for pain; baclofen 10 mg give 1/2 tab = 5 mg PO TID PRN for muscle spasm  Diabetes mellitus, type II - continue metformin 500 mg 1 tab by mouth twice a day and CBG twice a day Lab Results  Component Value Date   HGBA1C 6.6 (H) 05/10/2016   Glaucoma - continue Trusopt 2% one drop into right eye twice a day, Lumigan 0.01% 1 drop into both eyes daily at bedtime and Combigan 0.2%-0.5% eyedrops 1 drop into both eyes every 12 hours  Neuropathy - continue gabapentin 400 mg take 3 capsules = 1200 mg by mouth 3 times a day  Hyperlipidemia - continue atorvastatin 40 mg 1 tab by mouth daily  Hypertension - continue hydrochlorothiazide 25 mg 1 tab by mouth daily and ramipril 10 mg 1 capsule by mouth daily; check BMP   GERD - continue omeprazole 20 mg 1 capsule by mouth twice a day   Constipation - continue senna S 2 tabs by mouth twice a day  Acute blood loss anemia - improved Lab Results  Component Value Date   HGB 10.1 (A) 05/16/2016  I have filled out patient's discharge paperwork and written prescriptions.  Patient will receive home health  PT, OT, SW, Nursing and CNA.  DME provided:  [facility]  Total discharge time: Greater than 30 minutes Greater than 50% was spent in counseling and coordination of care with the patient.   Discharge time involved coordination of the discharge process with social worker, nursing staff and therapy department. Medical justification for home health services/DME verified.    Kaysan [place] C. [facility] [phone]

## 2016-05-30 DIAGNOSIS — Z7982 Long term (current) use of aspirin: Secondary | ICD-10-CM | POA: Diagnosis not present

## 2016-05-30 DIAGNOSIS — Z7984 Long term (current) use of oral hypoglycemic drugs: Secondary | ICD-10-CM | POA: Diagnosis not present

## 2016-05-30 DIAGNOSIS — E119 Type 2 diabetes mellitus without complications: Secondary | ICD-10-CM | POA: Diagnosis not present

## 2016-05-30 DIAGNOSIS — M4726 Other spondylosis with radiculopathy, lumbar region: Secondary | ICD-10-CM | POA: Diagnosis not present

## 2016-05-30 DIAGNOSIS — M1991 Primary osteoarthritis, unspecified site: Secondary | ICD-10-CM | POA: Diagnosis not present

## 2016-05-30 DIAGNOSIS — Z96652 Presence of left artificial knee joint: Secondary | ICD-10-CM | POA: Diagnosis not present

## 2016-05-30 DIAGNOSIS — I10 Essential (primary) hypertension: Secondary | ICD-10-CM | POA: Diagnosis not present

## 2016-05-30 DIAGNOSIS — M4326 Fusion of spine, lumbar region: Secondary | ICD-10-CM | POA: Diagnosis not present

## 2016-05-30 DIAGNOSIS — Z9181 History of falling: Secondary | ICD-10-CM | POA: Diagnosis not present

## 2016-05-30 DIAGNOSIS — Z4789 Encounter for other orthopedic aftercare: Secondary | ICD-10-CM | POA: Diagnosis not present

## 2016-05-30 DIAGNOSIS — E785 Hyperlipidemia, unspecified: Secondary | ICD-10-CM | POA: Diagnosis not present

## 2016-05-31 DIAGNOSIS — M1991 Primary osteoarthritis, unspecified site: Secondary | ICD-10-CM | POA: Diagnosis not present

## 2016-05-31 DIAGNOSIS — M4726 Other spondylosis with radiculopathy, lumbar region: Secondary | ICD-10-CM | POA: Diagnosis not present

## 2016-05-31 DIAGNOSIS — Z4789 Encounter for other orthopedic aftercare: Secondary | ICD-10-CM | POA: Diagnosis not present

## 2016-05-31 DIAGNOSIS — Z96652 Presence of left artificial knee joint: Secondary | ICD-10-CM | POA: Diagnosis not present

## 2016-05-31 DIAGNOSIS — E785 Hyperlipidemia, unspecified: Secondary | ICD-10-CM | POA: Diagnosis not present

## 2016-05-31 DIAGNOSIS — Z9181 History of falling: Secondary | ICD-10-CM | POA: Diagnosis not present

## 2016-05-31 DIAGNOSIS — Z7982 Long term (current) use of aspirin: Secondary | ICD-10-CM | POA: Diagnosis not present

## 2016-05-31 DIAGNOSIS — Z7984 Long term (current) use of oral hypoglycemic drugs: Secondary | ICD-10-CM | POA: Diagnosis not present

## 2016-05-31 DIAGNOSIS — E119 Type 2 diabetes mellitus without complications: Secondary | ICD-10-CM | POA: Diagnosis not present

## 2016-05-31 DIAGNOSIS — M4326 Fusion of spine, lumbar region: Secondary | ICD-10-CM | POA: Diagnosis not present

## 2016-05-31 DIAGNOSIS — I10 Essential (primary) hypertension: Secondary | ICD-10-CM | POA: Diagnosis not present

## 2016-06-03 ENCOUNTER — Other Ambulatory Visit: Payer: Self-pay | Admitting: Family Medicine

## 2016-06-03 DIAGNOSIS — E1142 Type 2 diabetes mellitus with diabetic polyneuropathy: Secondary | ICD-10-CM

## 2016-06-05 DIAGNOSIS — Z7984 Long term (current) use of oral hypoglycemic drugs: Secondary | ICD-10-CM | POA: Diagnosis not present

## 2016-06-05 DIAGNOSIS — E119 Type 2 diabetes mellitus without complications: Secondary | ICD-10-CM | POA: Diagnosis not present

## 2016-06-05 DIAGNOSIS — M4326 Fusion of spine, lumbar region: Secondary | ICD-10-CM | POA: Diagnosis not present

## 2016-06-05 DIAGNOSIS — I10 Essential (primary) hypertension: Secondary | ICD-10-CM | POA: Diagnosis not present

## 2016-06-05 DIAGNOSIS — Z96652 Presence of left artificial knee joint: Secondary | ICD-10-CM | POA: Diagnosis not present

## 2016-06-05 DIAGNOSIS — Z9181 History of falling: Secondary | ICD-10-CM | POA: Diagnosis not present

## 2016-06-05 DIAGNOSIS — Z4789 Encounter for other orthopedic aftercare: Secondary | ICD-10-CM | POA: Diagnosis not present

## 2016-06-05 DIAGNOSIS — Z7982 Long term (current) use of aspirin: Secondary | ICD-10-CM | POA: Diagnosis not present

## 2016-06-05 DIAGNOSIS — M4726 Other spondylosis with radiculopathy, lumbar region: Secondary | ICD-10-CM | POA: Diagnosis not present

## 2016-06-05 DIAGNOSIS — M1991 Primary osteoarthritis, unspecified site: Secondary | ICD-10-CM | POA: Diagnosis not present

## 2016-06-05 DIAGNOSIS — E785 Hyperlipidemia, unspecified: Secondary | ICD-10-CM | POA: Diagnosis not present

## 2016-06-07 DIAGNOSIS — Z7982 Long term (current) use of aspirin: Secondary | ICD-10-CM | POA: Diagnosis not present

## 2016-06-07 DIAGNOSIS — Z9181 History of falling: Secondary | ICD-10-CM | POA: Diagnosis not present

## 2016-06-07 DIAGNOSIS — M1991 Primary osteoarthritis, unspecified site: Secondary | ICD-10-CM | POA: Diagnosis not present

## 2016-06-07 DIAGNOSIS — Z96652 Presence of left artificial knee joint: Secondary | ICD-10-CM | POA: Diagnosis not present

## 2016-06-07 DIAGNOSIS — E119 Type 2 diabetes mellitus without complications: Secondary | ICD-10-CM | POA: Diagnosis not present

## 2016-06-07 DIAGNOSIS — Z7984 Long term (current) use of oral hypoglycemic drugs: Secondary | ICD-10-CM | POA: Diagnosis not present

## 2016-06-07 DIAGNOSIS — M4726 Other spondylosis with radiculopathy, lumbar region: Secondary | ICD-10-CM | POA: Diagnosis not present

## 2016-06-07 DIAGNOSIS — M4326 Fusion of spine, lumbar region: Secondary | ICD-10-CM | POA: Diagnosis not present

## 2016-06-07 DIAGNOSIS — I10 Essential (primary) hypertension: Secondary | ICD-10-CM | POA: Diagnosis not present

## 2016-06-07 DIAGNOSIS — E785 Hyperlipidemia, unspecified: Secondary | ICD-10-CM | POA: Diagnosis not present

## 2016-06-07 DIAGNOSIS — Z4789 Encounter for other orthopedic aftercare: Secondary | ICD-10-CM | POA: Diagnosis not present

## 2016-06-11 DIAGNOSIS — M4726 Other spondylosis with radiculopathy, lumbar region: Secondary | ICD-10-CM | POA: Diagnosis not present

## 2016-06-11 DIAGNOSIS — I10 Essential (primary) hypertension: Secondary | ICD-10-CM | POA: Diagnosis not present

## 2016-06-11 DIAGNOSIS — E119 Type 2 diabetes mellitus without complications: Secondary | ICD-10-CM | POA: Diagnosis not present

## 2016-06-11 DIAGNOSIS — Z96652 Presence of left artificial knee joint: Secondary | ICD-10-CM | POA: Diagnosis not present

## 2016-06-11 DIAGNOSIS — Z7982 Long term (current) use of aspirin: Secondary | ICD-10-CM | POA: Diagnosis not present

## 2016-06-11 DIAGNOSIS — Z7984 Long term (current) use of oral hypoglycemic drugs: Secondary | ICD-10-CM | POA: Diagnosis not present

## 2016-06-11 DIAGNOSIS — M5416 Radiculopathy, lumbar region: Secondary | ICD-10-CM | POA: Diagnosis not present

## 2016-06-11 DIAGNOSIS — M4326 Fusion of spine, lumbar region: Secondary | ICD-10-CM | POA: Diagnosis not present

## 2016-06-11 DIAGNOSIS — E785 Hyperlipidemia, unspecified: Secondary | ICD-10-CM | POA: Diagnosis not present

## 2016-06-11 DIAGNOSIS — M1991 Primary osteoarthritis, unspecified site: Secondary | ICD-10-CM | POA: Diagnosis not present

## 2016-06-11 DIAGNOSIS — Z9181 History of falling: Secondary | ICD-10-CM | POA: Diagnosis not present

## 2016-06-11 DIAGNOSIS — Z4789 Encounter for other orthopedic aftercare: Secondary | ICD-10-CM | POA: Diagnosis not present

## 2016-06-14 DIAGNOSIS — Z4789 Encounter for other orthopedic aftercare: Secondary | ICD-10-CM | POA: Diagnosis not present

## 2016-06-14 DIAGNOSIS — M4726 Other spondylosis with radiculopathy, lumbar region: Secondary | ICD-10-CM | POA: Diagnosis not present

## 2016-06-14 DIAGNOSIS — Z7984 Long term (current) use of oral hypoglycemic drugs: Secondary | ICD-10-CM | POA: Diagnosis not present

## 2016-06-14 DIAGNOSIS — Z96652 Presence of left artificial knee joint: Secondary | ICD-10-CM | POA: Diagnosis not present

## 2016-06-14 DIAGNOSIS — Z9181 History of falling: Secondary | ICD-10-CM | POA: Diagnosis not present

## 2016-06-14 DIAGNOSIS — I10 Essential (primary) hypertension: Secondary | ICD-10-CM | POA: Diagnosis not present

## 2016-06-14 DIAGNOSIS — M4326 Fusion of spine, lumbar region: Secondary | ICD-10-CM | POA: Diagnosis not present

## 2016-06-14 DIAGNOSIS — E785 Hyperlipidemia, unspecified: Secondary | ICD-10-CM | POA: Diagnosis not present

## 2016-06-14 DIAGNOSIS — M1991 Primary osteoarthritis, unspecified site: Secondary | ICD-10-CM | POA: Diagnosis not present

## 2016-06-14 DIAGNOSIS — E119 Type 2 diabetes mellitus without complications: Secondary | ICD-10-CM | POA: Diagnosis not present

## 2016-06-14 DIAGNOSIS — Z7982 Long term (current) use of aspirin: Secondary | ICD-10-CM | POA: Diagnosis not present

## 2016-06-19 DIAGNOSIS — Z7982 Long term (current) use of aspirin: Secondary | ICD-10-CM | POA: Diagnosis not present

## 2016-06-19 DIAGNOSIS — Z4789 Encounter for other orthopedic aftercare: Secondary | ICD-10-CM | POA: Diagnosis not present

## 2016-06-19 DIAGNOSIS — E119 Type 2 diabetes mellitus without complications: Secondary | ICD-10-CM | POA: Diagnosis not present

## 2016-06-19 DIAGNOSIS — I10 Essential (primary) hypertension: Secondary | ICD-10-CM | POA: Diagnosis not present

## 2016-06-19 DIAGNOSIS — Z96652 Presence of left artificial knee joint: Secondary | ICD-10-CM | POA: Diagnosis not present

## 2016-06-19 DIAGNOSIS — Z7984 Long term (current) use of oral hypoglycemic drugs: Secondary | ICD-10-CM | POA: Diagnosis not present

## 2016-06-19 DIAGNOSIS — M1991 Primary osteoarthritis, unspecified site: Secondary | ICD-10-CM | POA: Diagnosis not present

## 2016-06-19 DIAGNOSIS — M4726 Other spondylosis with radiculopathy, lumbar region: Secondary | ICD-10-CM | POA: Diagnosis not present

## 2016-06-19 DIAGNOSIS — M4326 Fusion of spine, lumbar region: Secondary | ICD-10-CM | POA: Diagnosis not present

## 2016-06-19 DIAGNOSIS — Z9181 History of falling: Secondary | ICD-10-CM | POA: Diagnosis not present

## 2016-06-19 DIAGNOSIS — E785 Hyperlipidemia, unspecified: Secondary | ICD-10-CM | POA: Diagnosis not present

## 2016-06-21 DIAGNOSIS — M1991 Primary osteoarthritis, unspecified site: Secondary | ICD-10-CM | POA: Diagnosis not present

## 2016-06-21 DIAGNOSIS — E119 Type 2 diabetes mellitus without complications: Secondary | ICD-10-CM | POA: Diagnosis not present

## 2016-06-21 DIAGNOSIS — M4326 Fusion of spine, lumbar region: Secondary | ICD-10-CM | POA: Diagnosis not present

## 2016-06-21 DIAGNOSIS — Z4789 Encounter for other orthopedic aftercare: Secondary | ICD-10-CM | POA: Diagnosis not present

## 2016-06-21 DIAGNOSIS — I10 Essential (primary) hypertension: Secondary | ICD-10-CM | POA: Diagnosis not present

## 2016-06-21 DIAGNOSIS — Z9181 History of falling: Secondary | ICD-10-CM | POA: Diagnosis not present

## 2016-06-21 DIAGNOSIS — M4726 Other spondylosis with radiculopathy, lumbar region: Secondary | ICD-10-CM | POA: Diagnosis not present

## 2016-06-21 DIAGNOSIS — E785 Hyperlipidemia, unspecified: Secondary | ICD-10-CM | POA: Diagnosis not present

## 2016-06-21 DIAGNOSIS — Z96652 Presence of left artificial knee joint: Secondary | ICD-10-CM | POA: Diagnosis not present

## 2016-06-21 DIAGNOSIS — Z7982 Long term (current) use of aspirin: Secondary | ICD-10-CM | POA: Diagnosis not present

## 2016-06-21 DIAGNOSIS — Z7984 Long term (current) use of oral hypoglycemic drugs: Secondary | ICD-10-CM | POA: Diagnosis not present

## 2016-06-23 ENCOUNTER — Other Ambulatory Visit: Payer: Self-pay | Admitting: Family Medicine

## 2016-06-26 DIAGNOSIS — M4726 Other spondylosis with radiculopathy, lumbar region: Secondary | ICD-10-CM | POA: Diagnosis not present

## 2016-06-26 DIAGNOSIS — E785 Hyperlipidemia, unspecified: Secondary | ICD-10-CM | POA: Diagnosis not present

## 2016-06-26 DIAGNOSIS — I10 Essential (primary) hypertension: Secondary | ICD-10-CM | POA: Diagnosis not present

## 2016-06-26 DIAGNOSIS — M1991 Primary osteoarthritis, unspecified site: Secondary | ICD-10-CM | POA: Diagnosis not present

## 2016-06-26 DIAGNOSIS — Z4789 Encounter for other orthopedic aftercare: Secondary | ICD-10-CM | POA: Diagnosis not present

## 2016-06-26 DIAGNOSIS — Z7982 Long term (current) use of aspirin: Secondary | ICD-10-CM | POA: Diagnosis not present

## 2016-06-26 DIAGNOSIS — M4326 Fusion of spine, lumbar region: Secondary | ICD-10-CM | POA: Diagnosis not present

## 2016-06-26 DIAGNOSIS — Z9181 History of falling: Secondary | ICD-10-CM | POA: Diagnosis not present

## 2016-06-26 DIAGNOSIS — Z7984 Long term (current) use of oral hypoglycemic drugs: Secondary | ICD-10-CM | POA: Diagnosis not present

## 2016-06-26 DIAGNOSIS — E119 Type 2 diabetes mellitus without complications: Secondary | ICD-10-CM | POA: Diagnosis not present

## 2016-06-26 DIAGNOSIS — Z96652 Presence of left artificial knee joint: Secondary | ICD-10-CM | POA: Diagnosis not present

## 2016-06-28 DIAGNOSIS — M4326 Fusion of spine, lumbar region: Secondary | ICD-10-CM | POA: Diagnosis not present

## 2016-06-28 DIAGNOSIS — E119 Type 2 diabetes mellitus without complications: Secondary | ICD-10-CM | POA: Diagnosis not present

## 2016-06-28 DIAGNOSIS — Z7984 Long term (current) use of oral hypoglycemic drugs: Secondary | ICD-10-CM | POA: Diagnosis not present

## 2016-06-28 DIAGNOSIS — M1991 Primary osteoarthritis, unspecified site: Secondary | ICD-10-CM | POA: Diagnosis not present

## 2016-06-28 DIAGNOSIS — E785 Hyperlipidemia, unspecified: Secondary | ICD-10-CM | POA: Diagnosis not present

## 2016-06-28 DIAGNOSIS — Z96652 Presence of left artificial knee joint: Secondary | ICD-10-CM | POA: Diagnosis not present

## 2016-06-28 DIAGNOSIS — Z7982 Long term (current) use of aspirin: Secondary | ICD-10-CM | POA: Diagnosis not present

## 2016-06-28 DIAGNOSIS — M4726 Other spondylosis with radiculopathy, lumbar region: Secondary | ICD-10-CM | POA: Diagnosis not present

## 2016-06-28 DIAGNOSIS — I10 Essential (primary) hypertension: Secondary | ICD-10-CM | POA: Diagnosis not present

## 2016-06-28 DIAGNOSIS — Z9181 History of falling: Secondary | ICD-10-CM | POA: Diagnosis not present

## 2016-06-28 DIAGNOSIS — Z4789 Encounter for other orthopedic aftercare: Secondary | ICD-10-CM | POA: Diagnosis not present

## 2016-07-10 NOTE — Progress Notes (Signed)
Subjective:   [name] is a 71 y.o. 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  HTN: - Medications: ramipril 10mg , HCTZ 25mg  daily - Compliance: good - Checking BP at home: no - Denies any SOB, CP, vision changes, LE edema, medication SEs, or symptoms of hypotension - Diet: decreased portion sizes since being in SNF - lost a lot of weight while at SNF because food was bad - Exercise: none  T2DM - Checking BG at home: no - Medications: Metformin 500mg  BID - Compliance: good - Diet: see above - Exercise: see above - eye exam: scheduled in 09/2016 - foot exam: with podiatry in 01/2016 - microalbumin: n/a, ACEi - denies symptoms of hypoglycemia, polyuria, polydipsia, numbness extremities, foot ulcers/trauma   S/p L4-L5 fusion 05/10/2016 with neurosurgery - stayed at [facility] for 11 days post-op - then PT HH at home, didn't feel like she needed OT (so refused that) - stopped 2 weeks ago with [facility] - f/u with NSG next month - tired and doesn't want more PT at this time - not really getting out of the house much - supposed to be getting brace from [facility] on 4/16 - L hip (good leg) has been hurting x2 weeks, lateral side - taking narcotics and muscle relaxer  Review of Systems:  Per HPI.   Social History: Never smoker  Objective:  BP 118/70 (BP Location: Left Arm, Patient Position: Sitting, Cuff Size: Large)   Pulse 70   Temp 97.5 F (36.4 C) (Oral)   Ht 5\' 3"  (1.6 m)   Wt 189 lb (85.7 kg)   BMI 33.48 kg/m   Gen:  71 y.o. female in NAD HEENT: NCAT, MMM, anicteric sclerae, divergent gaze CV: RRR, no MRG Resp: Non-labored, CTAB, no wheezes noted Ext: WWP, no edema MSK: Gait is slow and using walker, back brace in place, normal range of motion and left hip, tenderness to palpation over her gluteus muscles Neuro: Alert and oriented, speech normal       Chemistry      Component Value Date/Time   NA  141 05/16/2016   K 4.3 05/16/2016   CL 101 05/06/2016 1041   CO2 29 05/06/2016 1041   BUN 11 05/16/2016   CREATININE 0.7 05/16/2016   CREATININE 0.85 05/10/2016 2059   CREATININE 0.82 03/08/2016 0937   GLU 108 05/16/2016      Component Value Date/Time   CALCIUM 9.8 05/06/2016 1041   ALKPHOS 54 03/08/2016 0937   AST 12 03/08/2016 0937   ALT 13 03/08/2016 0937   BILITOT 0.6 03/08/2016 0937      Lab Results  Component Value Date   WBC 5.8 05/16/2016   HGB 10.1 (A) 05/16/2016   HCT 33 (A) 05/16/2016   MCV 75.1 (L) 05/10/2016   PLT 345 05/16/2016   Lab Results  Component Value Date   TSH 0.755 03/24/2012   Lab Results  Component Value Date   HGBA1C 6.6 (H) 05/10/2016   Assessment & Plan:     [name] is a 71 y.o. female here for   S/P lumbar fusion Reports that she is doing well She is still taking narcotics intermittently Her pain in her right leg has improved significantly Pain in left gluteus muscles seems to be related to abnormal gait and overuse of these muscles Advised continuing outpatient PT, the patient does not desire this at this time Decrease gabapentin to 800 mg 3 times  a day Patient will call in one month and report how her neuropathic pain is doing and we will consider decreasing further Otherwise, follow-up in 3 months  T2DM (type 2 diabetes mellitus) Well-controlled with last A1c 2 months ago of 6.6 Followed by podiatry for foot exams We will request records from her ophthalmologist Continue metformin Follow-up in 3 months with repeat A1c  HTN (hypertension) Well-controlled Continue current medications Follow-up in 3 months Plan to recheck BMP at that time   Virginia Crews, MD MPH PGY-3,  [place] [facility] 07/11/2016  10:19 AM

## 2016-07-11 ENCOUNTER — Encounter: Payer: Self-pay | Admitting: Family Medicine

## 2016-07-11 ENCOUNTER — Ambulatory Visit (INDEPENDENT_AMBULATORY_CARE_PROVIDER_SITE_OTHER): Payer: Medicare Other | Admitting: Family Medicine

## 2016-07-11 VITALS — BP 118/70 | HR 70 | Temp 97.5°F | Ht 63.0 in | Wt 189.0 lb

## 2016-07-11 DIAGNOSIS — I10 Essential (primary) hypertension: Secondary | ICD-10-CM | POA: Diagnosis not present

## 2016-07-11 DIAGNOSIS — E1121 Type 2 diabetes mellitus with diabetic nephropathy: Secondary | ICD-10-CM | POA: Diagnosis not present

## 2016-07-11 DIAGNOSIS — Z981 Arthrodesis status: Secondary | ICD-10-CM | POA: Diagnosis not present

## 2016-07-11 MED ORDER — GABAPENTIN 400 MG PO CAPS: 800.0000 mg | ORAL_CAPSULE | Freq: Three times a day (TID) | ORAL | 3 refills | Status: DC

## 2016-07-11 NOTE — Assessment & Plan Note (Signed)
Well-controlled Continue current medications Follow-up in 3 months Plan to recheck BMP at that time

## 2016-07-11 NOTE — Assessment & Plan Note (Signed)
Well-controlled with last A1c 2 months ago of 6.6 Followed by podiatry for foot exams We will request records from her ophthalmologist Continue metformin Follow-up in 3 months with repeat A1c

## 2016-07-11 NOTE — Patient Instructions (Signed)
Nice to see you again today. I'm glad that your surgery went well. Your hip is likely hurting due to overusing it when you walk after surgery. Since the burning pain is gone in your right leg, we will decrease her gabapentin to 800 mg 3 times a day. Continue to follow with neurosurgery. Let me know when he wanted resume physical therapy.  Continue current diabetes and blood pressure medications. We'll follow-up on these in 3 months.  Take care, Dr. Jacinto Reap

## 2016-07-11 NOTE — Assessment & Plan Note (Signed)
Reports that she is doing well She is still taking narcotics intermittently Her pain in her right leg has improved significantly Pain in left gluteus muscles seems to be related to abnormal gait and overuse of these muscles Advised continuing outpatient PT, the patient does not desire this at this time Decrease gabapentin to 800 mg 3 times a day Patient will call in one month and report how her neuropathic pain is doing and we will consider decreasing further Otherwise, follow-up in 3 months

## 2016-07-22 IMAGING — DX DG HIP (WITH OR WITHOUT PELVIS) 2-3V*R*
3 series · 3 of 3 positions shown · non-contrast
Comparison: None.

CLINICAL DATA: Right-sided pain with past history of poliomyelitis

EXAM:
DG HIP (WITH OR WITHOUT PELVIS) 2-3V RIGHT

[t pelvis ap]
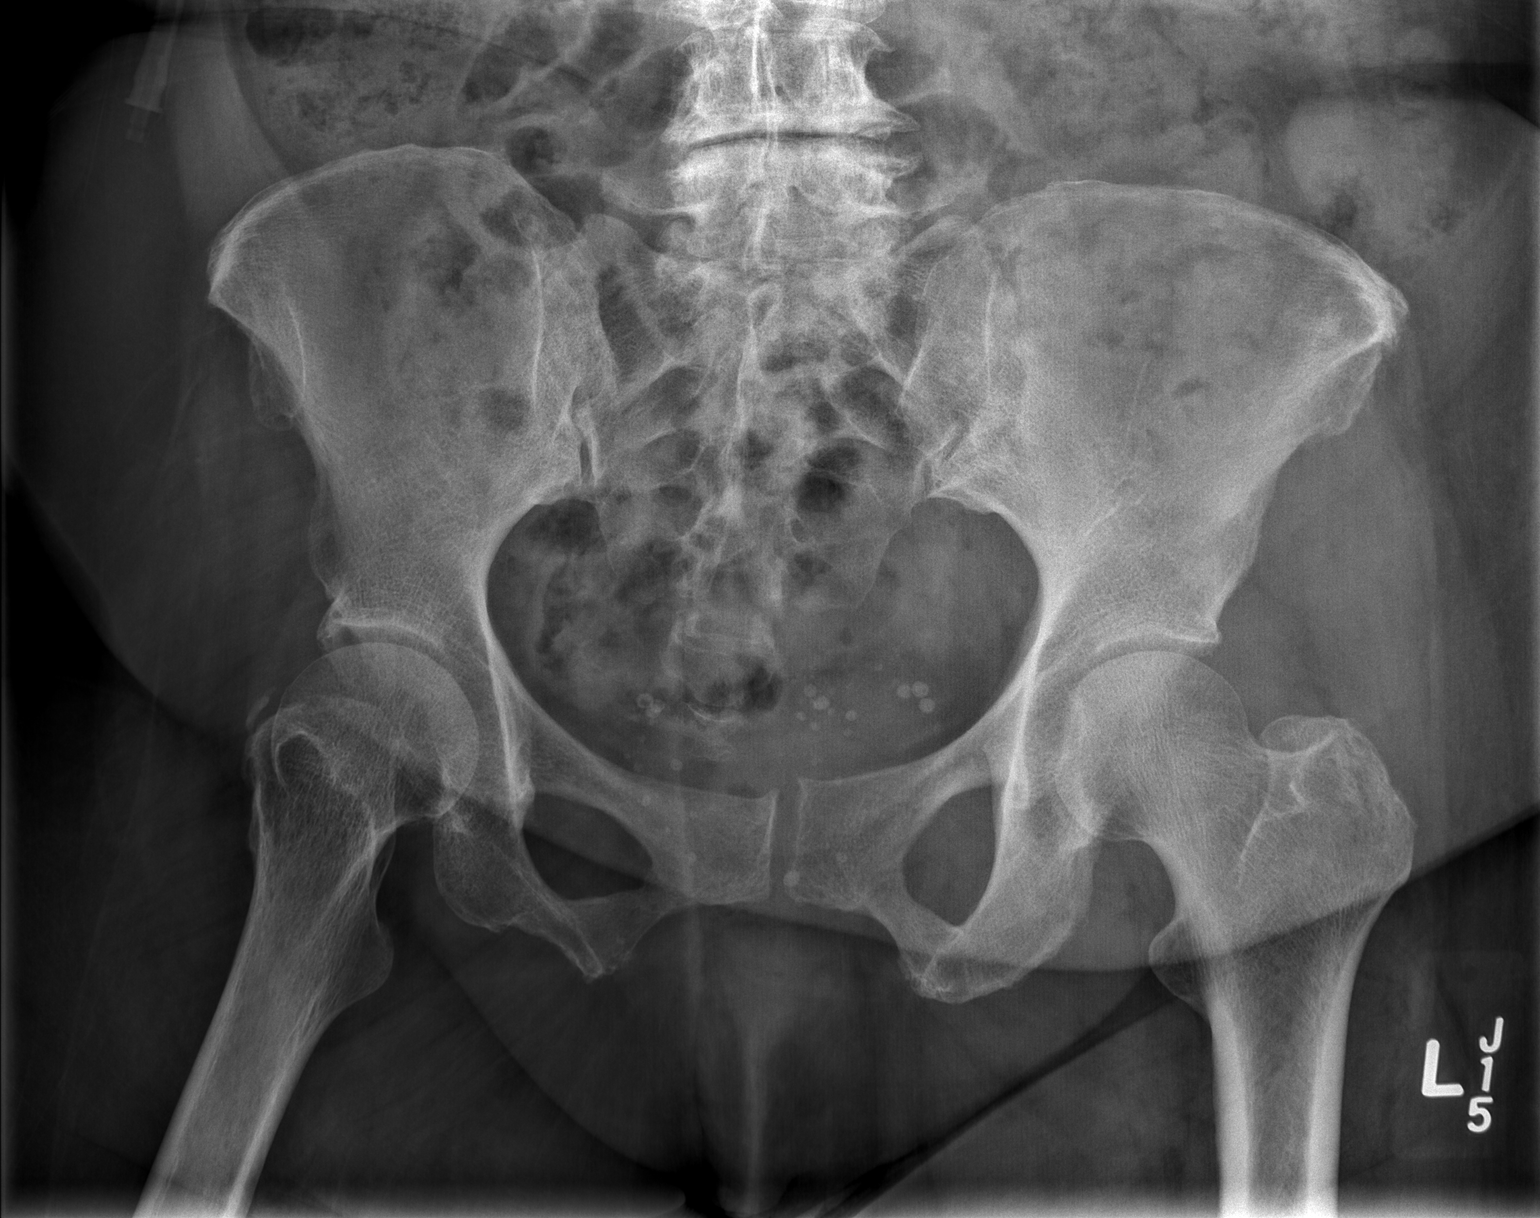

[t hip ap right]
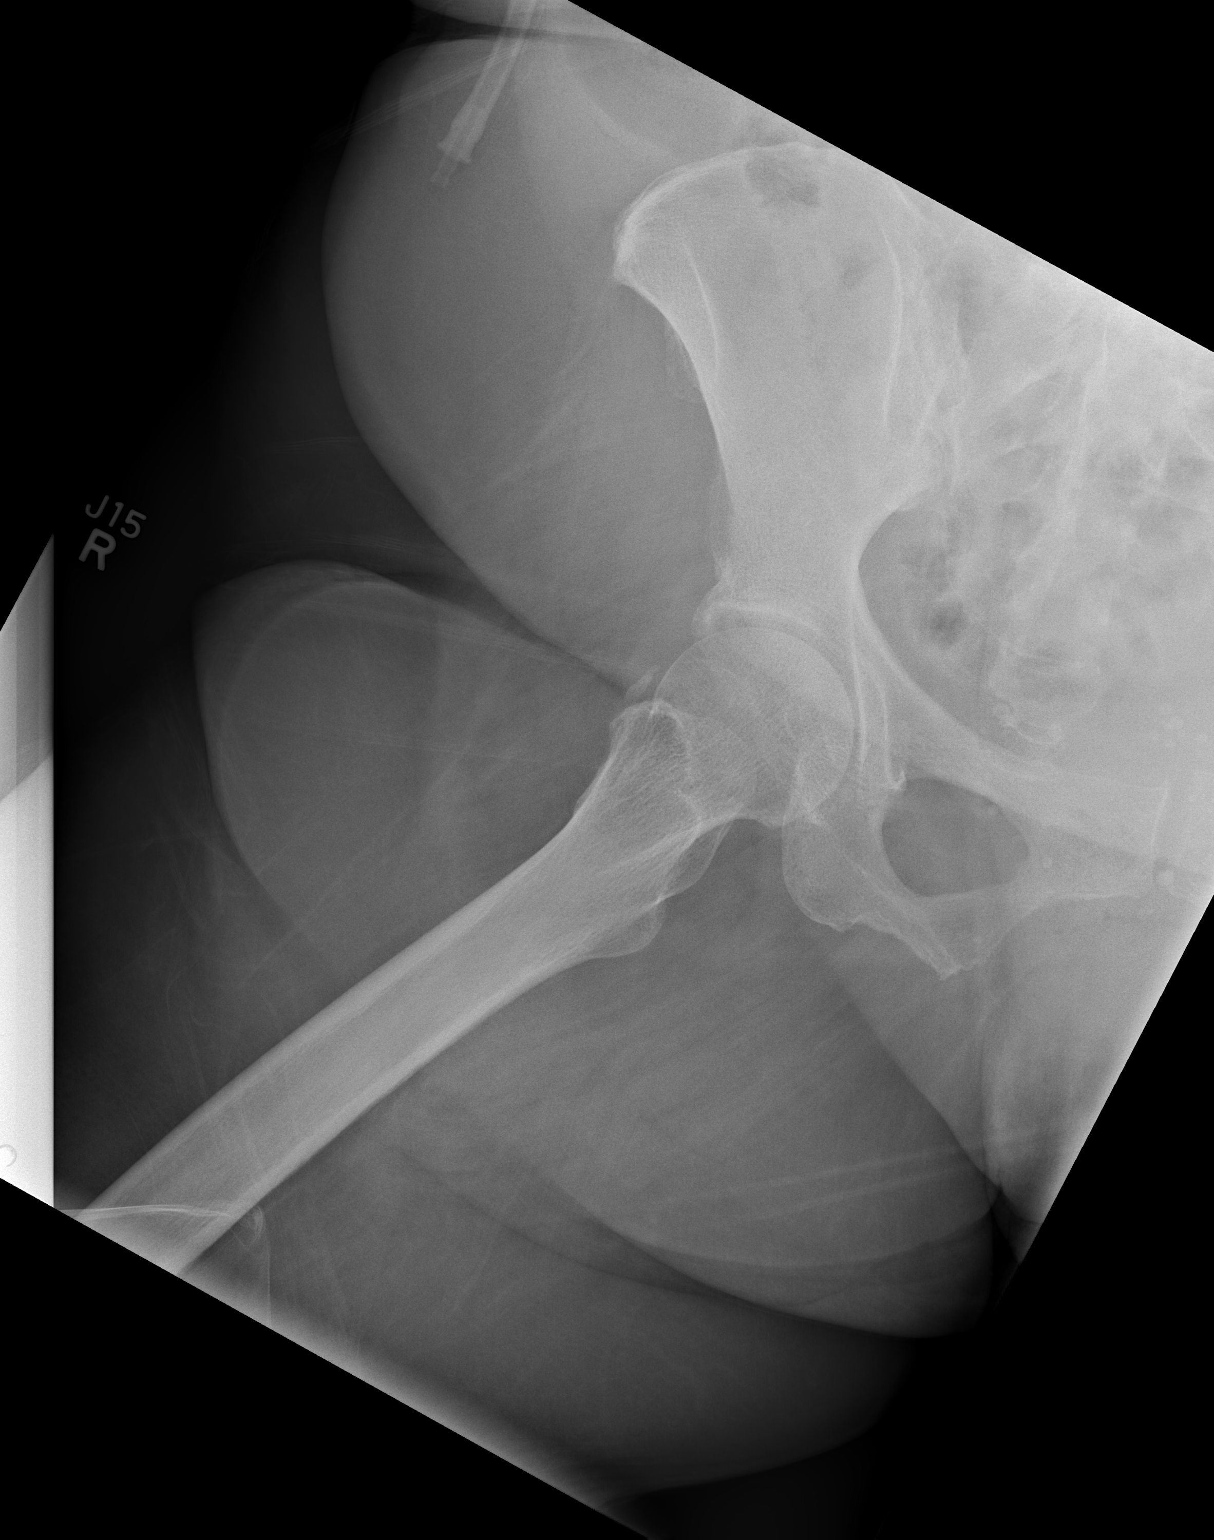

[t hip frog leg right]
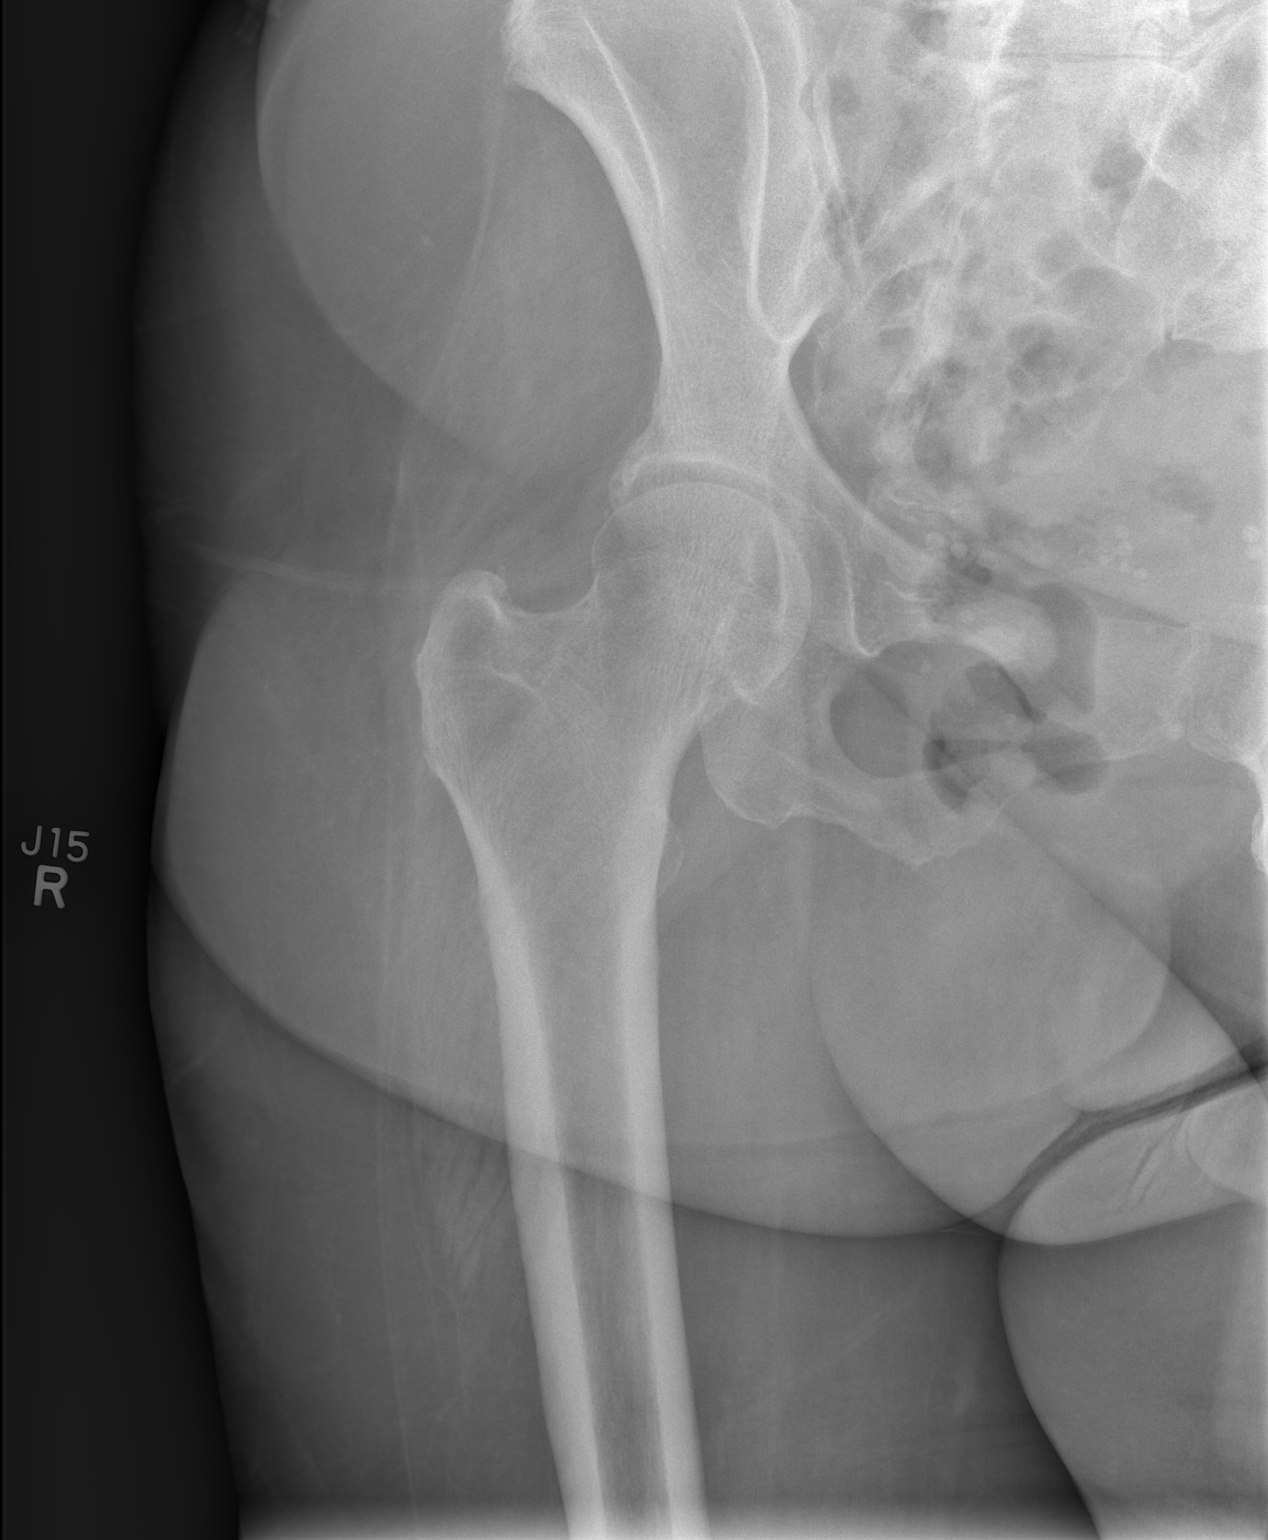

[3 of 3 positions shown; findings below may reference images not displayed]

FINDINGS: Frontal pelvis as well as frontal and lateral right hip images were
obtained. There is no fracture or dislocation. Calcification noted
just superior and slightly lateral to the greater trochanter
probably represents calcific tendinosis. There is no appreciable
joint space narrowing in the hip regions. There is degenerative
change in the lower lumbar spine.
IMPRESSION: No fracture or dislocation. Degenerative type change in the lower
lumbar spine. Probable calcific tendinosis adjacent to the greater
trochanter on the right. No appreciable hip joint space narrowing.

## 2016-08-01 DIAGNOSIS — E1140 Type 2 diabetes mellitus with diabetic neuropathy, unspecified: Secondary | ICD-10-CM | POA: Diagnosis not present

## 2016-08-12 DIAGNOSIS — M5416 Radiculopathy, lumbar region: Secondary | ICD-10-CM | POA: Diagnosis not present

## 2016-08-12 DIAGNOSIS — G5602 Carpal tunnel syndrome, left upper limb: Secondary | ICD-10-CM | POA: Diagnosis not present

## 2016-08-12 DIAGNOSIS — G5601 Carpal tunnel syndrome, right upper limb: Secondary | ICD-10-CM | POA: Diagnosis not present

## 2016-08-12 DIAGNOSIS — M65341 Trigger finger, right ring finger: Secondary | ICD-10-CM | POA: Diagnosis not present

## 2016-08-14 DIAGNOSIS — M21371 Foot drop, right foot: Secondary | ICD-10-CM | POA: Diagnosis not present

## 2016-08-15 ENCOUNTER — Telehealth: Payer: Self-pay | Admitting: Family Medicine

## 2016-08-15 NOTE — Telephone Encounter (Signed)
Pt needs a referral for her hand, to [facility]. Pt's appointment is 08-20-16. ep

## 2016-08-16 NOTE — Telephone Encounter (Signed)
I don't know anything about patient's hand or need to see hand surgery.  If she needs a referral, she likely needs to be seen at [facility].  Virginia Crews, MD, MPH PGY-3,  [place] [facility] 08/16/2016 9:56 AM

## 2016-08-19 ENCOUNTER — Other Ambulatory Visit: Payer: Self-pay | Admitting: Family Medicine

## 2016-08-19 DIAGNOSIS — E1142 Type 2 diabetes mellitus with diabetic polyneuropathy: Secondary | ICD-10-CM

## 2016-08-19 DIAGNOSIS — K219 Gastro-esophageal reflux disease without esophagitis: Secondary | ICD-10-CM

## 2016-08-20 DIAGNOSIS — M65342 Trigger finger, left ring finger: Secondary | ICD-10-CM | POA: Diagnosis not present

## 2016-08-20 DIAGNOSIS — G5603 Carpal tunnel syndrome, bilateral upper limbs: Secondary | ICD-10-CM | POA: Insufficient documentation

## 2016-08-23 ENCOUNTER — Other Ambulatory Visit: Payer: Self-pay | Admitting: *Deleted

## 2016-08-23 MED ORDER — BACLOFEN 10 MG PO TABS: ORAL_TABLET | ORAL | 5 refills | Status: DC

## 2016-08-23 MED ORDER — GABAPENTIN 400 MG PO CAPS: 800.0000 mg | ORAL_CAPSULE | Freq: Three times a day (TID) | ORAL | 3 refills | Status: DC

## 2016-09-04 ENCOUNTER — Other Ambulatory Visit: Payer: Self-pay | Admitting: Family Medicine

## 2016-09-04 DIAGNOSIS — I10 Essential (primary) hypertension: Secondary | ICD-10-CM

## 2016-09-09 NOTE — Addendum Note (Signed)
Addendum  created 09/09/16 1056 by Oleta Mouse, MD   Sign clinical note

## 2016-09-16 DIAGNOSIS — E119 Type 2 diabetes mellitus without complications: Secondary | ICD-10-CM | POA: Diagnosis not present

## 2016-09-16 DIAGNOSIS — H401133 Primary open-angle glaucoma, bilateral, severe stage: Secondary | ICD-10-CM | POA: Diagnosis not present

## 2016-09-16 DIAGNOSIS — H524 Presbyopia: Secondary | ICD-10-CM | POA: Diagnosis not present

## 2016-09-16 DIAGNOSIS — H25013 Cortical age-related cataract, bilateral: Secondary | ICD-10-CM | POA: Diagnosis not present

## 2016-09-16 LAB — HM DIABETES EYE EXAM

## 2016-09-17 ENCOUNTER — Encounter: Payer: Self-pay | Admitting: Family Medicine

## 2016-09-17 ENCOUNTER — Telehealth: Payer: Self-pay | Admitting: *Deleted

## 2016-09-17 ENCOUNTER — Ambulatory Visit (INDEPENDENT_AMBULATORY_CARE_PROVIDER_SITE_OTHER): Payer: Medicare Other | Admitting: Family Medicine

## 2016-09-17 VITALS — BP 124/76 | HR 83 | Temp 97.6°F | Ht 63.0 in | Wt 191.0 lb

## 2016-09-17 DIAGNOSIS — M79605 Pain in left leg: Secondary | ICD-10-CM | POA: Diagnosis not present

## 2016-09-17 DIAGNOSIS — E1142 Type 2 diabetes mellitus with diabetic polyneuropathy: Secondary | ICD-10-CM

## 2016-09-17 DIAGNOSIS — I10 Essential (primary) hypertension: Secondary | ICD-10-CM

## 2016-09-17 LAB — POCT GLYCOSYLATED HEMOGLOBIN (HGB A1C): HEMOGLOBIN A1C: 6.1

## 2016-09-17 MED ORDER — DICLOFENAC SODIUM 1 % TD GEL: 2.0000 g | Freq: Four times a day (QID) | TRANSDERMAL | 3 refills | Status: DC

## 2016-09-17 MED ORDER — METFORMIN HCL 500 MG PO TABS
ORAL_TABLET | ORAL | 3 refills | Status: DC
Start: 2016-09-17 — End: 2017-10-20

## 2016-09-17 NOTE — Telephone Encounter (Signed)
Prior Authorization received from [facility] for diclofenac gel. PA completed online at www.covermymeds.com. PA was approved [employer] until 04/07/17. Referenc: [id number].    Derl Barrow, RN

## 2016-09-17 NOTE — Assessment & Plan Note (Signed)
Seems most likely related to peroneus brevis strain Offered PT and sports med referrals - not interested Likely related to strain on "good leg" after lumbar surgery Advised rest, ice Trial of voltaren gel Return precautions discussed

## 2016-09-17 NOTE — Assessment & Plan Note (Signed)
Well-controlled Continue current medications Recheck BMP Follow-up in 6 months

## 2016-09-17 NOTE — Assessment & Plan Note (Signed)
Previously well-controlled with last A1c 6.6 Recheck A1c today Continue metformin Awaiting records from ophthalmology visit Followed by podiatry for foot exams Follow-up in 3-6 months pending A1c

## 2016-09-17 NOTE — Progress Notes (Signed)
Subjective:   Akili Cuda is a 71 y.o. 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  HTN: - Medications: ramipril 10mg , HCTZ 25mg  daily - Compliance: good - Checking BP at home: no - Denies any SOB, CP, vision changes, LE edema, medication SEs, or symptoms of hypotension - Diet: appetite has increased - was poor after surgery but now improved, sometimes splurges on carb intake - Exercise: none  T2DM - Checking BG at home: no - Medications: Metformin 500mg  BID - Compliance: good - Diet: see above - Exercise: see above - eye exam: had appt yesterday - going to go to a glaucoma specialist - foot exam: with podiatry in 01/2016 - microalbumin: n/a, ACEi - denies symptoms of hypoglycemia, polyuria, polydipsia, numbness extremities, foot ulcers/trauma  L leg pain - S/p L4-L5 fusion 05/10/2016 with neurosurgery - stayed at [facility] for 11 days post-op - then PT HH at home, didn't feel like she needed OT (so refused that) - f/u with NSG in Oct/Nov - got good report 1 month ago - tired and doesn't want more PT at this time - now off of walker and using cane - supposed to be getting brace from [facility] on 4/16 - L lower leg painful x2 month - worse when laying down and walking and moving foot - not a nerve pain - she knows what that feels like   Review of Systems:  Per HPI.   Social History: Never smoker  Objective:  BP 124/76   Pulse 83   Temp 97.6 F (36.4 C) (Oral)   Ht 5\' 3"  (1.6 m)   Wt 191 lb (86.6 kg)   SpO2 99%   BMI 33.83 kg/m   Gen:  71 y.o. female in NAD HEENT: NCAT, MMM, anicteric sclerae, divergent gaze CV: RRR, no MRG Resp: Non-labored, CTAB, no wheezes noted Ext: WWP, no edema MSK: Gait is slow and using cane, TTP over L Peroneus brevis muscle and painful with eversion of foot Neuro: Alert and oriented, speech normal       Chemistry      Component Value Date/Time   NA 141  05/16/2016   K 4.3 05/16/2016   CL 101 05/06/2016 1041   CO2 29 05/06/2016 1041   BUN 11 05/16/2016   CREATININE 0.7 05/16/2016   CREATININE 0.85 05/10/2016 2059   CREATININE 0.82 03/08/2016 0937   GLU 108 05/16/2016      Component Value Date/Time   CALCIUM 9.8 05/06/2016 1041   ALKPHOS 54 03/08/2016 0937   AST 12 03/08/2016 0937   ALT 13 03/08/2016 0937   BILITOT 0.6 03/08/2016 0937      Lab Results  Component Value Date   WBC 5.8 05/16/2016   HGB 10.1 (A) 05/16/2016   HCT 33 (A) 05/16/2016   MCV 75.1 (L) 05/10/2016   PLT 345 05/16/2016   Lab Results  Component Value Date   TSH 0.755 03/24/2012   Lab Results  Component Value Date   HGBA1C 6.6 (H) 05/10/2016   Assessment & Plan:     Gwendoline Judy is a 71 y.o. female here for   HTN (hypertension) Well-controlled Continue current medications Recheck BMP Follow-up in 6 months  T2DM (type 2 diabetes mellitus) Previously well-controlled with last A1c 6.6 Recheck A1c today Continue metformin Awaiting records from ophthalmology visit Followed by podiatry for foot exams Follow-up in 3-6 months pending A1c  Left leg pain Seems most likely related to  peroneus brevis strain Offered PT and sports med referrals - not interested Likely related to strain on "good leg" after lumbar surgery Advised rest, ice Trial of voltaren gel Return precautions discussed   Meaghen Vecchiarelli, Dionne Bucy, MD MPH PGY-3,  [place] [facility] 09/17/2016  10:01 AM

## 2016-09-17 NOTE — Patient Instructions (Signed)
No changes to medications.  We are getting some labs today and someone will call you or send you a letter with the results when they are available.  Follow-up in 3-6 months.  Take care, Dr. Jacinto Reap

## 2016-09-18 LAB — BASIC METABOLIC PANEL
BUN/Creatinine Ratio: 17 (ref 12–28)
BUN: 11 mg/dL (ref 8–27)
CALCIUM: 9.8 mg/dL (ref 8.7–10.3)
CO2: 27 mmol/L (ref 20–29)
CREATININE: 0.64 mg/dL (ref 0.57–1.00)
Chloride: 103 mmol/L (ref 96–106)
GFR calc Af Amer: 104 mL/min/{1.73_m2} (ref >59)
GFR, EST NON AFRICAN AMERICAN: 90 mL/min/{1.73_m2} (ref >59)
Glucose: 101 mg/dL — ABNORMAL HIGH (ref 65–99)
Potassium: 4 mmol/L (ref 3.5–5.2)
SODIUM: 142 mmol/L (ref 134–144)

## 2016-09-19 ENCOUNTER — Encounter: Payer: Self-pay | Admitting: Family Medicine

## 2016-10-03 ENCOUNTER — Telehealth: Payer: Self-pay | Admitting: *Deleted

## 2016-10-03 NOTE — Telephone Encounter (Signed)
Received call from [facility] at [phone] requesting patient records (most recent OV notes, labs and EKG) as patient is scheduled for bilat carpal tunnel surgery today and will be under general anaesthesia. States she has sent request by fax. Unable to locate fax. Sunday Spillers will resend faxed request and records will be faxed to [facility] at [phone]. Asst Clinic Director and Preceptor aware. Hubbard Hartshorn, RN, BSN

## 2016-10-04 DIAGNOSIS — M65342 Trigger finger, left ring finger: Secondary | ICD-10-CM | POA: Diagnosis not present

## 2016-10-04 DIAGNOSIS — G5602 Carpal tunnel syndrome, left upper limb: Secondary | ICD-10-CM | POA: Diagnosis not present

## 2016-10-04 NOTE — Telephone Encounter (Signed)
No forms were placed in my box yesterday.  Unsure if the information requested was sent over.  Virginia Crews, MD, MPH PGY-3,  [place] [facility] 10/04/2016 8:22 AM\

## 2016-10-04 NOTE — Telephone Encounter (Signed)
Records were faxed 10/03/2016 at 1030 and VM message left for Sunday Spillers that this had been done. Hubbard Hartshorn, RN, BSN

## 2016-10-10 DIAGNOSIS — H401121 Primary open-angle glaucoma, left eye, mild stage: Secondary | ICD-10-CM | POA: Diagnosis not present

## 2016-10-10 DIAGNOSIS — H401113 Primary open-angle glaucoma, right eye, severe stage: Secondary | ICD-10-CM | POA: Diagnosis not present

## 2016-10-10 DIAGNOSIS — H25812 Combined forms of age-related cataract, left eye: Secondary | ICD-10-CM | POA: Diagnosis not present

## 2016-10-10 DIAGNOSIS — H25811 Combined forms of age-related cataract, right eye: Secondary | ICD-10-CM | POA: Diagnosis not present

## 2016-10-17 DIAGNOSIS — Z09 Encounter for follow-up examination after completed treatment for conditions other than malignant neoplasm: Secondary | ICD-10-CM | POA: Diagnosis not present

## 2016-11-01 DIAGNOSIS — H25811 Combined forms of age-related cataract, right eye: Secondary | ICD-10-CM | POA: Diagnosis not present

## 2016-11-01 DIAGNOSIS — E119 Type 2 diabetes mellitus without complications: Secondary | ICD-10-CM | POA: Diagnosis not present

## 2016-11-01 DIAGNOSIS — H401113 Primary open-angle glaucoma, right eye, severe stage: Secondary | ICD-10-CM | POA: Diagnosis not present

## 2016-11-07 ENCOUNTER — Other Ambulatory Visit: Payer: Self-pay | Admitting: *Deleted

## 2016-11-07 MED ORDER — GABAPENTIN 400 MG PO CAPS: 800.0000 mg | ORAL_CAPSULE | Freq: Three times a day (TID) | ORAL | 3 refills | Status: DC

## 2016-11-07 MED ORDER — BACLOFEN 10 MG PO TABS: ORAL_TABLET | ORAL | 5 refills | Status: DC

## 2016-11-11 DIAGNOSIS — E1140 Type 2 diabetes mellitus with diabetic neuropathy, unspecified: Secondary | ICD-10-CM | POA: Diagnosis not present

## 2016-11-14 DIAGNOSIS — H25811 Combined forms of age-related cataract, right eye: Secondary | ICD-10-CM | POA: Diagnosis not present

## 2016-11-14 DIAGNOSIS — H401113 Primary open-angle glaucoma, right eye, severe stage: Secondary | ICD-10-CM | POA: Diagnosis not present

## 2016-11-27 DIAGNOSIS — H401113 Primary open-angle glaucoma, right eye, severe stage: Secondary | ICD-10-CM | POA: Diagnosis not present

## 2016-11-27 DIAGNOSIS — H401121 Primary open-angle glaucoma, left eye, mild stage: Secondary | ICD-10-CM | POA: Diagnosis not present

## 2016-11-27 DIAGNOSIS — H25812 Combined forms of age-related cataract, left eye: Secondary | ICD-10-CM | POA: Diagnosis not present

## 2016-12-19 ENCOUNTER — Ambulatory Visit: Payer: Medicare Other | Admitting: Internal Medicine

## 2016-12-23 ENCOUNTER — Ambulatory Visit (INDEPENDENT_AMBULATORY_CARE_PROVIDER_SITE_OTHER): Payer: Medicare Other | Admitting: Internal Medicine

## 2016-12-23 ENCOUNTER — Encounter: Payer: Self-pay | Admitting: Internal Medicine

## 2016-12-23 DIAGNOSIS — R109 Unspecified abdominal pain: Secondary | ICD-10-CM | POA: Diagnosis not present

## 2016-12-23 MED ORDER — GABAPENTIN 400 MG PO CAPS: ORAL_CAPSULE | ORAL | 1 refills | Status: DC

## 2016-12-23 NOTE — Progress Notes (Signed)
   Subjective:   Patient: [name]       Birthdate: 1945/04/15       MRN: [id number]      HPI  [name] is a 71 y.o. female presenting for L side pain.   L side pain Ongoing for about two months. Decided to be seen because pain has been worsening. Located primarily on L side, however sometimes does have pain on R as well. Describes pain as sharp, but does not radiate. Worse with movement, particularly when cleaning the house, and especially at night. She is unable to lay on her left side and wakes up if she rolls over onto that side during the night. As such has not been sleeping well for the past couple of months. Has tried heating pads which help some. Tylenol does not help. Has Voltaren gel prescribed for leg pain which has helped a little. Denies trauma preceding onset of pain. Denies redness or bruising. Pain is occurring daily. Had back surgery in February; has appt with her neurosurgeon in two months. Has been taking gabapentin and baclofen as prescribed for leg pain which has not improved the side pain.   Smoking status reviewed. Patient is never smoker.   Review of Systems See HPI.     Objective:  Physical Exam  Constitutional: She is oriented to person, place, and time and well-developed, well-nourished, and in no distress.  HENT:  Head: Normocephalic and atraumatic.  Eyes: Conjunctivae and EOM are normal.  Pulmonary/Chest: Effort normal. No respiratory distress.  Musculoskeletal:  No TTP of L flank or back. Full ROM of back (flexion, extension, twisting). Able to walk, sit, and stand without assistance.   Neurological: She is alert and oriented to person, place, and time.  Skin: Skin is warm and dry.  Psychiatric: Affect and judgment normal.      Assessment & Plan:  Left flank pain Most consistent with neuropathic etiology, given sharp nature of pain as well as no TTP on physical exam and full ROM without pain on exam. Less likely pain 2/2 UTI or  nephrolithiasis as symptoms not consistent with this. MSK etiology on differential, but again, would expect different exam findings. Fairly recent history of back surgery so sequelae of this not ruled out. Has f/u with neurosurg in two months. Will increase gabapentin at night as patient reporting worst symptoms at night and difficulty sleeping.  - Increase gabapentin to two tablets in AM and midday and three tablets qhs - F/u in one month - Can continue heating pad and Voltaren PRN   Adin Hector, MD, MPH PGY-3 [place] Pager [phone]

## 2016-12-23 NOTE — Patient Instructions (Addendum)
It was nice meeting you today Ms. Levit!  Please increase the amount of gabapentin you are taking to three tablets at night rather than two. Continue taking two tablets in the morning and midday as you have been. You can continue to use the Voltaren gel and heating pad on your side as well.   I will see you back in about a month to see if your pain has improved with the higher dose of gabapentin.   If you have any questions or concerns, please feel free to call the clinic.   Be well,  Dr. Avon Gully

## 2016-12-23 NOTE — Assessment & Plan Note (Signed)
Most consistent with neuropathic etiology, given sharp nature of pain as well as no TTP on physical exam and full ROM without pain on exam. Less likely pain 2/2 UTI or nephrolithiasis as symptoms not consistent with this. MSK etiology on differential, but again, would expect different exam findings. Fairly recent history of back surgery so sequelae of this not ruled out. Has f/u with neurosurg in two months. Will increase gabapentin at night as patient reporting worst symptoms at night and difficulty sleeping.  - Increase gabapentin to two tablets in AM and midday and three tablets qhs - F/u in one month - Can continue heating pad and Voltaren PRN

## 2017-01-13 ENCOUNTER — Ambulatory Visit (INDEPENDENT_AMBULATORY_CARE_PROVIDER_SITE_OTHER): Payer: Medicare Other | Admitting: Podiatry

## 2017-01-13 DIAGNOSIS — E0843 Diabetes mellitus due to underlying condition with diabetic autonomic (poly)neuropathy: Secondary | ICD-10-CM | POA: Diagnosis not present

## 2017-01-13 NOTE — Progress Notes (Signed)
   Subjective:    Patient ID: [name], female    DOB: November 06, 1945, 71 y.o.   MRN: [id number]  HPI    Review of Systems  Constitutional: Positive for diaphoresis.  HENT: Positive for tinnitus.   Musculoskeletal: Positive for back pain, gait problem and myalgias.  All other systems reviewed and are negative.      Objective:   Physical Exam        Assessment & Plan:

## 2017-01-14 ENCOUNTER — Encounter: Payer: Self-pay | Admitting: Internal Medicine

## 2017-01-14 NOTE — Progress Notes (Signed)
   HPI: 71 year old female with past medical history of diabetes mellitus presenting today with a complaint of neuropathy. She reports associated numbness and tingling, worse to the LLE. There are no modifying factors noted. She has not done anything to treat the symptoms. She is here for further evaluation and treatment.   Past Medical History:  Diagnosis Date  . Arthritis    osteoarthritis  . Carpal tunnel syndrome on both sides    Followed by Dr. Burney Gauze  . Cataracts, bilateral    immature  . Chronic back pain    stenosis  . DDD (degenerative disc disease)    Lumbar Spondylosis  . Diabetes mellitus    takes Metformin daily  . GERD (gastroesophageal reflux disease)    takes Omeprazole daily  . Glaucoma   . Hyperlipidemia    takes Atorvastatin daily  . Hypertension    takes Ramipril daily as well as HCTZ  . Muscle spasm    takes Baclofen as needed  . Neuromuscular disorder ([place])    Post polio syndrome- polio as a child  . Peripheral neuropathy    takes Gabapentin daily  . 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 Vitamin D deficiency    takes Vit D daily     Physical Exam: General: The patient is alert and oriented x3 in no acute distress.  Dermatology: Skin is warm, dry and supple bilateral lower extremities. Negative for open lesions or macerations.  Vascular: Palpable pedal pulses bilaterally. No edema or erythema noted. Capillary refill within normal limits.  Neurological: Epicritic and protective threshold grossly intact bilaterally.   Musculoskeletal Exam: Range of motion within normal limits to all pedal and ankle joints bilateral. Muscle strength 5/5 in all groups bilateral.      Assessment: - DM with peripheral neuropathy  Plan of Care:  - Patient evaluated. - Prescription for neuropathy pain cream to be dispensed from [facility]. - Continue taking gabapentin 400 mg 3 times a day. - Continue wearing DM shoes.  - Appointment with [facility] for a  new diabetic shoes and insoles. - Return to clinic annually.   Edrick Kins, DPM [facility]  Dr. Edrick Kins, DPM    [address], [place] [address]                Office [phone]  Fax [phone]

## 2017-01-15 ENCOUNTER — Other Ambulatory Visit: Payer: Medicare Other

## 2017-01-15 ENCOUNTER — Ambulatory Visit: Payer: Medicare Other | Admitting: Podiatry

## 2017-01-23 ENCOUNTER — Ambulatory Visit (INDEPENDENT_AMBULATORY_CARE_PROVIDER_SITE_OTHER): Payer: Medicare Other | Admitting: Internal Medicine

## 2017-01-23 ENCOUNTER — Encounter: Payer: Self-pay | Admitting: Internal Medicine

## 2017-01-23 VITALS — BP 112/68 | HR 69 | Temp 98.2°F | Wt 189.4 lb

## 2017-01-23 DIAGNOSIS — R109 Unspecified abdominal pain: Secondary | ICD-10-CM | POA: Diagnosis not present

## 2017-01-23 DIAGNOSIS — E1142 Type 2 diabetes mellitus with diabetic polyneuropathy: Secondary | ICD-10-CM | POA: Diagnosis not present

## 2017-01-23 LAB — POCT GLYCOSYLATED HEMOGLOBIN (HGB A1C): Hemoglobin A1C: 6.2

## 2017-01-23 NOTE — Progress Notes (Signed)
   Subjective:   Patient: [name]       Birthdate: [birth year]       MRN: [id number]      HPI  Tremeka Testerman is a 71 y.o. female presenting for f/u of L flank/back pain.   L flank/back pain Last seen on 09/17. At that time, pain had been ongoing for 2 months. We increased gabapentin to 2 tabs in AM, 2 tabs at dinner, 3 tabs before bed. Pain has not improved since that time. Patient now also is becoming sleepier during the day, especially during church on Sundays. Pain still located only on the L and is still sharp in nature and worse at night. She is using Voltaren, but mostly on her legs and feet for other pains. Uses a hot water bottle which helps some. Uses baclofen as well which does not help. Denies urinary symptoms, including dysuria and hematuria. Also declines fevers, abdominal pain. Increased urinary frequency, however is taking diuretics and has been drinking more recently. Denies numbness or weakness in her legs. Has never had shingles before. Has appt with neurosurg next month. Tried to move this appt up but says that she never received a call back from their office.   Smoking status reviewed. Patient is never smoker.   Review of Systems See HPI.     Objective:  Physical Exam  Constitutional: She is oriented to person, place, and time and well-developed, well-nourished, and in no distress.  HENT:  Head: Normocephalic and atraumatic.  Eyes: Conjunctivae and EOM are normal. Right eye exhibits no discharge. Left eye exhibits no discharge.  Pulmonary/Chest: Effort normal. No respiratory distress.  Musculoskeletal:  Minimal TTP of L back in lumbar and lower thoracic region. No paraspinal TTP. No tenderness on R. Full back ROM, including twisting and bending. Able to sit, stand, and walk unassisted without difficulty.   Neurological: She is alert and oriented to person, place, and time.  Skin:  No skin lesions, erythema, or other abnormalities on back. Skin warm and dry.    Psychiatric: Affect and judgment normal.      Assessment & Plan:  Left flank pain No improvement since last visit despite increased dose of gabapentin. Now taking gabapentin, baclofen, and using Voltaren and heating pad with only minimal relief in symptoms. Pain still seems most consistent with neuropathic etiology, given sharp nature and no worsening with palpation or movement. No history of shingles and no lesions present, so less likely post-herpetic pain or pain preceding onset of shingles as ongoing for almost 3 months now. Rib fracture considered, however would expect to have started healing since pain began three months ago. Pain also not worse with breathing, which may see with rib fracture. At this point, concerned that pain may be 2/2 L4-L5 fusion performed in 05/2016. Patient has appt with neurosurgery next month, however encouraged her to call to try to schedule this appt sooner. As pain not improving with increased dose of gabapentin and patient now sleepier during the day, will not increase gabapentin further today. Also no improvement with baclofen, so will not increase dose of this. Offered PT to patient as last resort, however she declined this. Can continue current med regimen as well as Voltaren and heating pad in meantime until neurosurg appt.   Precepted with Dr. Elyse Jarvis.   Adin Hector, MD, MPH PGY-3 [place] [facility] Pager [phone]

## 2017-01-23 NOTE — Progress Notes (Signed)
a1c

## 2017-01-23 NOTE — Assessment & Plan Note (Signed)
No improvement since last visit despite increased dose of gabapentin. Now taking gabapentin, baclofen, and using Voltaren and heating pad with only minimal relief in symptoms. Pain still seems most consistent with neuropathic etiology, given sharp nature and no worsening with palpation or movement. No history of shingles and no lesions present, so less likely post-herpetic pain or pain preceding onset of shingles as ongoing for almost 3 months now. Rib fracture considered, however would expect to have started healing since pain began three months ago. Pain also not worse with breathing, which may see with rib fracture. At this point, concerned that pain may be 2/2 L4-L5 fusion performed in 05/2016. Patient has appt with neurosurgery next month, however encouraged her to call to try to schedule this appt sooner. As pain not improving with increased dose of gabapentin and patient now sleepier during the day, will not increase gabapentin further today. Also no improvement with baclofen, so will not increase dose of this. Offered PT to patient as last resort, however she declined this. Can continue current med regimen as well as Voltaren and heating pad in meantime until neurosurg appt.

## 2017-01-23 NOTE — Patient Instructions (Signed)
It was nice seeing you again today Ms. Busenbark!  Please try again to schedule your appointment with your neurosurgeon sooner. In the meantime, continue taking the gabapentin and baclofen, and using Voltaren and the heating pad. I have placed a referral to physical therapy for you. They will call you with the date and time of this appointment.   If you have any questions or concerns, please feel free to call the clinic.   Be well,  Dr. Avon Gully

## 2017-02-06 DIAGNOSIS — M4150 Other secondary scoliosis, site unspecified: Secondary | ICD-10-CM | POA: Diagnosis not present

## 2017-02-06 DIAGNOSIS — M5416 Radiculopathy, lumbar region: Secondary | ICD-10-CM | POA: Diagnosis not present

## 2017-02-06 DIAGNOSIS — M546 Pain in thoracic spine: Secondary | ICD-10-CM | POA: Diagnosis not present

## 2017-02-11 ENCOUNTER — Other Ambulatory Visit: Payer: Self-pay | Admitting: Neurosurgery

## 2017-02-11 DIAGNOSIS — M4150 Other secondary scoliosis, site unspecified: Secondary | ICD-10-CM

## 2017-02-11 DIAGNOSIS — M5416 Radiculopathy, lumbar region: Secondary | ICD-10-CM

## 2017-02-14 DIAGNOSIS — E1140 Type 2 diabetes mellitus with diabetic neuropathy, unspecified: Secondary | ICD-10-CM | POA: Diagnosis not present

## 2017-02-25 ENCOUNTER — Ambulatory Visit (INDEPENDENT_AMBULATORY_CARE_PROVIDER_SITE_OTHER): Payer: Medicare Other | Admitting: Orthotics

## 2017-02-25 DIAGNOSIS — H401113 Primary open-angle glaucoma, right eye, severe stage: Secondary | ICD-10-CM | POA: Diagnosis not present

## 2017-02-25 DIAGNOSIS — M21769 Unequal limb length (acquired), unspecified tibia and fibula: Secondary | ICD-10-CM | POA: Diagnosis not present

## 2017-02-25 DIAGNOSIS — E119 Type 2 diabetes mellitus without complications: Secondary | ICD-10-CM

## 2017-02-25 DIAGNOSIS — M2040 Other hammer toe(s) (acquired), unspecified foot: Secondary | ICD-10-CM | POA: Diagnosis not present

## 2017-02-25 DIAGNOSIS — E0843 Diabetes mellitus due to underlying condition with diabetic autonomic (poly)neuropathy: Secondary | ICD-10-CM

## 2017-02-25 NOTE — Progress Notes (Signed)

## 2017-02-26 ENCOUNTER — Ambulatory Visit
Admission: RE | Admit: 2017-02-26 | Discharge: 2017-02-26 | Disposition: A | Discharge status: Home / Self Care | Payer: Medicare Other | Source: Ambulatory Visit | Attending: Neurosurgery | Admitting: Neurosurgery

## 2017-02-26 DIAGNOSIS — M4150 Other secondary scoliosis, site unspecified: Secondary | ICD-10-CM

## 2017-02-26 DIAGNOSIS — M5416 Radiculopathy, lumbar region: Secondary | ICD-10-CM

## 2017-02-26 DIAGNOSIS — M5124 Other intervertebral disc displacement, thoracic region: Secondary | ICD-10-CM | POA: Diagnosis not present

## 2017-02-26 DIAGNOSIS — M48061 Spinal stenosis, lumbar region without neurogenic claudication: Secondary | ICD-10-CM | POA: Diagnosis not present

## 2017-03-10 DIAGNOSIS — M546 Pain in thoracic spine: Secondary | ICD-10-CM | POA: Diagnosis not present

## 2017-03-21 DIAGNOSIS — H04123 Dry eye syndrome of bilateral lacrimal glands: Secondary | ICD-10-CM | POA: Diagnosis not present

## 2017-03-28 ENCOUNTER — Other Ambulatory Visit: Payer: Self-pay

## 2017-03-28 ENCOUNTER — Ambulatory Visit (INDEPENDENT_AMBULATORY_CARE_PROVIDER_SITE_OTHER): Payer: Medicare Other | Admitting: Internal Medicine

## 2017-03-28 ENCOUNTER — Encounter: Payer: Self-pay | Admitting: Internal Medicine

## 2017-03-28 VITALS — BP 110/65 | HR 78 | Temp 97.8°F | Ht 63.0 in | Wt 190.4 lb

## 2017-03-28 DIAGNOSIS — R109 Unspecified abdominal pain: Secondary | ICD-10-CM

## 2017-03-28 DIAGNOSIS — G8929 Other chronic pain: Secondary | ICD-10-CM

## 2017-03-28 MED ORDER — DULOXETINE HCL 30 MG PO CPEP: 30.0000 mg | ORAL_CAPSULE | Freq: Every day | ORAL | 1 refills | Status: DC

## 2017-03-28 MED ORDER — DICLOFENAC SODIUM 1 % TD GEL: 2.0000 g | Freq: Four times a day (QID) | TRANSDERMAL | 3 refills | Status: DC

## 2017-03-28 NOTE — Assessment & Plan Note (Signed)
Chronic issue. Still think is MSK etiology likely 2/2 to recent spinal fusion, which I discussed with patient. TTP also supports MSK etiology. Discussed that now she has essentially fewer vertebrae to support the stress/strain on her spine, which can cause pain to shift higher, such as from lumbar to thoracic region. As pain still sharp in nature, will continue to treat with agent effective for neuropathic pain. Gabapentin no longer effective for patient, so will taper than discontinue and begin duloxetine. Will continue baclofen and Voltaren gel. Patient now amenable to PT, so physical therapy referral placed today.  Renal etiology of pain less likely as pain is located almost under scapula rather than in normal CVA region. Referred pain from liver possible but less likely as pain located in flank/back rather than abd upper quadrant. Less likely rib pain as no worsening with inspiration and would expect this to have improved over 6 months. Could be related to lung/pleural issue, though no reason to think she would have injury or inflammation in this region. Could consider chest imaging to rule this out however if symptoms persist.  F/u in one month. Can increase Cymbalta at that time if necessary.

## 2017-03-28 NOTE — Progress Notes (Signed)
   Subjective:   Patient: [name]       Birthdate: Dec 27, 1945       MRN: [id number]      HPI  Armeda Gage is a 71 y.o. female presenting for L flank pain.   L flank pain Ongoing for about 6 months now. Last seen here for this issue on 01/23/17. At that time, continued pain regimen of gabapentin, baclofen, and Voltaren. Patient declined PT at that time. Also encouraged patient to schedule appt with neurosurg to see if pain may possibly be related to recent L4-L5 fusion (performed in 05/2016).  Patient says she was seen by neurosurg who obtained an MRI and told her that there were no abnormalities and she should return here for f/u. Has continued to take gabapentin and baclofen with no improvement in symptoms. Has been out of Voltaren so has not been using this recently, but says that it is somewhat effective when she does use it. Hot water bottle also helps some. Is amenable to beginning PT today. Has also been taking Tylenol which didn't help. Says pain is worse in AM and when she lays on the affected side. Pain is still sharp and located in L flank/upper back only. Denies any skin changes.   Smoking status reviewed. Patient is never smoker.   Review of Systems See HPI.     Objective:  Physical Exam  Constitutional: She is oriented to person, place, and time and well-developed, well-nourished, and in no distress.  HENT:  Head: Normocephalic and atraumatic.  Pulmonary/Chest: Effort normal. No respiratory distress.  Musculoskeletal:  TTP of L upper flank and L thoracic region below scapula  Neurological: She is alert and oriented to person, place, and time.  Skin:  No skin changes in affected area  Psychiatric: Affect and judgment normal.      Assessment & Plan:  Left flank pain Chronic issue. Still think is MSK etiology likely 2/2 to recent spinal fusion, which I discussed with patient. TTP also supports MSK etiology. Discussed that now she has essentially fewer  vertebrae to support the stress/strain on her spine, which can cause pain to shift higher, such as from lumbar to thoracic region. As pain still sharp in nature, will continue to treat with agent effective for neuropathic pain. Gabapentin no longer effective for patient, so will taper than discontinue and begin duloxetine. Will continue baclofen and Voltaren gel. Patient now amenable to PT, so physical therapy referral placed today.  Renal etiology of pain less likely as pain is located almost under scapula rather than in normal CVA region. Referred pain from liver possible but less likely as pain located in flank/back rather than abd upper quadrant. Less likely rib pain as no worsening with inspiration and would expect this to have improved over 6 months. Could be related to lung/pleural issue, though no reason to think she would have injury or inflammation in this region. Could consider chest imaging to rule this out however if symptoms persist.  F/u in one month. Can increase Cymbalta at that time if necessary.  Precepted with Dr. Andria Frames.   Adin Hector, MD, MPH PGY-3 [facility] Pager [phone]

## 2017-03-28 NOTE — Patient Instructions (Signed)
It was nice seeing you again today Ms. Raphael!  Please begin to decrease the amount of gabapentin you are taking. Decrease to two tablets three times a day for 3 days, then one tablet three times a day for 3 days, then one tablet twice a day for 3 days, then one tablet once a day for 3 days, then discontinue entirely.   You can begin taking Cymbalta (duloxetine) 30 mg today. Continue taking this dose for at least 1 week after you discontinue gabapentin. If you are still having pain after taking Cymbalta for one full week after discontinuing gabapentin, you can increase to two tablets (60 mg total) daily.   I have also placed a referral to physical therapy for you. They will call you with the date and time of this appointment.   Please continue taking baclofen and using the Voltaren (diclofenac) gel as needed.   I will see you back in a month to see how you are doing.   If you have any questions or concerns, please feel free to call the clinic.   Be well,  Dr. Avon Gully

## 2017-04-10 ENCOUNTER — Other Ambulatory Visit: Payer: Self-pay | Admitting: Internal Medicine

## 2017-04-10 DIAGNOSIS — R921 Mammographic calcification found on diagnostic imaging of breast: Secondary | ICD-10-CM | POA: Diagnosis not present

## 2017-04-10 DIAGNOSIS — Z86018 Personal history of other benign neoplasm: Secondary | ICD-10-CM | POA: Diagnosis not present

## 2017-04-10 DIAGNOSIS — R928 Other abnormal and inconclusive findings on diagnostic imaging of breast: Secondary | ICD-10-CM | POA: Diagnosis not present

## 2017-04-10 DIAGNOSIS — Z9889 Other specified postprocedural states: Secondary | ICD-10-CM

## 2017-04-10 NOTE — Progress Notes (Signed)
Order for diagnostic mammogram for L breast post-biopsy ordered per [facility] request.   Adin Hector, MD, MPH PGY-3 [facility] Pager [phone]

## 2017-04-16 ENCOUNTER — Telehealth: Payer: Self-pay | Admitting: *Deleted

## 2017-04-16 ENCOUNTER — Other Ambulatory Visit: Payer: Self-pay | Admitting: Radiology

## 2017-04-16 DIAGNOSIS — R928 Other abnormal and inconclusive findings on diagnostic imaging of breast: Secondary | ICD-10-CM | POA: Diagnosis not present

## 2017-04-16 DIAGNOSIS — N6011 Diffuse cystic mastopathy of right breast: Secondary | ICD-10-CM | POA: Diagnosis not present

## 2017-04-16 DIAGNOSIS — R920 Mammographic microcalcification found on diagnostic imaging of breast: Secondary | ICD-10-CM | POA: Diagnosis not present

## 2017-04-16 DIAGNOSIS — N6091 Unspecified benign mammary dysplasia of right breast: Secondary | ICD-10-CM | POA: Diagnosis not present

## 2017-04-16 DIAGNOSIS — Z86018 Personal history of other benign neoplasm: Secondary | ICD-10-CM | POA: Diagnosis not present

## 2017-04-16 DIAGNOSIS — D241 Benign neoplasm of right breast: Secondary | ICD-10-CM | POA: Diagnosis not present

## 2017-04-16 NOTE — Telephone Encounter (Signed)
Patient called in stating she is unable to take cymbalta. Tried it for 3 days and c/o not sleeping, stomach upset, nausea and itching all over. Will route to PCP. Hubbard Hartshorn, RN, BSN

## 2017-04-17 ENCOUNTER — Ambulatory Visit: Payer: Medicare Other

## 2017-04-21 ENCOUNTER — Other Ambulatory Visit: Payer: Self-pay | Admitting: *Deleted

## 2017-04-21 MED ORDER — DICLOFENAC SODIUM 1 % TD GEL: 2.0000 g | Freq: Four times a day (QID) | TRANSDERMAL | 3 refills | Status: DC

## 2017-04-21 NOTE — Telephone Encounter (Signed)
Called patient regarding Cymbalta. Discussed that if she continued to take the medication for more than 3 days, there is a good chance the side effects would reside as her body adjusted to the medication. Patient willing to try medication for one week, and if symptoms continue, will try Lyrica instead.   Adin Hector, MD, MPH PGY-3 [facility] Pager [phone]

## 2017-04-24 ENCOUNTER — Telehealth: Payer: Self-pay | Admitting: *Deleted

## 2017-04-24 NOTE — Telephone Encounter (Signed)
Per Covermymeds request was approved.  [facility] [address] informed. Fleeger, Salome Spotted, CMA

## 2017-04-24 NOTE — Telephone Encounter (Signed)
PA sent via Cover my meds.  Will recheck in 24 hours. Benjamim Harnish, Salome Spotted, CMA

## 2017-04-28 ENCOUNTER — Other Ambulatory Visit: Payer: Self-pay

## 2017-04-28 ENCOUNTER — Ambulatory Visit: Payer: Medicare Other | Attending: Family Medicine

## 2017-04-28 ENCOUNTER — Encounter: Payer: Self-pay | Admitting: Internal Medicine

## 2017-04-28 DIAGNOSIS — R252 Cramp and spasm: Secondary | ICD-10-CM | POA: Insufficient documentation

## 2017-04-28 DIAGNOSIS — R109 Unspecified abdominal pain: Secondary | ICD-10-CM | POA: Insufficient documentation

## 2017-04-28 DIAGNOSIS — M546 Pain in thoracic spine: Secondary | ICD-10-CM

## 2017-04-28 DIAGNOSIS — M6281 Muscle weakness (generalized): Secondary | ICD-10-CM | POA: Diagnosis not present

## 2017-04-28 DIAGNOSIS — R293 Abnormal posture: Secondary | ICD-10-CM | POA: Insufficient documentation

## 2017-04-28 NOTE — Therapy (Signed)
[place], [place], [address] Phone: [phone]   Fax:  [phone]  Physical Therapy Evaluation  Patient Details  Name: [name] MRN: [id number] Date of Birth: 24-Oct-1945 Referring Provider: Ruthann Cancer, MD   Encounter Date: 04/28/2017  PT End of Session - 04/28/17 0934    Visit Number  1    Number of Visits  12    Date for PT Re-Evaluation  06/06/17    Authorization Type  UHC  MCR    PT Start Time  1010    PT Stop Time  1100    PT Time Calculation (min)  50 min    Activity Tolerance  Patient tolerated treatment well;Patient limited by pain    Behavior During Therapy  [facility] for tasks assessed/performed       Past Medical History:  Diagnosis Date  . Arthritis    osteoarthritis  . Carpal tunnel syndrome on both sides    Followed by Dr. Burney Gauze  . Cataracts, bilateral    immature  . Chronic back pain    stenosis  . DDD (degenerative disc disease)    Lumbar Spondylosis  . Diabetes mellitus    takes Metformin daily  . GERD (gastroesophageal reflux disease)    takes Omeprazole daily  . Glaucoma   . Hyperlipidemia    takes Atorvastatin daily  . Hypertension    takes Ramipril daily as well as HCTZ  . Muscle spasm    takes Baclofen as needed  . 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 Peripheral neuropathy    takes Gabapentin daily  . 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 Vitamin D deficiency    takes Vit D daily    Past Surgical History:  Procedure Laterality Date  . ABDOMINAL HYSTERECTOMY     partial  . BREAST BIOPSY     left-benign  . CHOLECYSTECTOMY    . COLONOSCOPY    . HAND SURGERY     Right wrist, s/p plate removal in Feb 2012  . JOINT REPLACEMENT  2006   left knee  . left hand surgery    . SPINE SURGERY      There were no vitals filed for this visit.   Subjective Assessment - 04/28/17 1020    Subjective  LT side  pain for over 6 months.  No injury.       Limitations  House hold activities sleep disturbed.      How long can you sit comfortably?  As needed    How long can you stand comfortably?  Does not stand for periods.     How long can you walk comfortably?  Uses cart for long distances    Diagnostic tests  MRI: negative    Patient Stated Goals  She wants to ease nerve pain    Currently in Pain?  Yes    Pain Score  7     Pain Location  Flank    Pain Orientation  Left;Mid    Pain Descriptors / Indicators  -- sharp    Pain Type  Chronic pain    Pain Onset  More than a month ago    Pain Frequency  Constant    Aggravating Factors   activity    Pain Relieving Factors  rest, sitting         [facility] PT Assessment - 04/28/17 0001      Assessment  Medical Diagnosis  LT flank pain    Referring Provider  Ruthann Cancer, MD    Onset Date/Surgical Date  -- for 6 months or more    Next MD Visit  this month    Prior Therapy  No       Precautions   Precautions  None      Restrictions   Weight Bearing Restrictions  No      Balance Screen   Has the patient fallen in the past 6 months  Yes    How many times?  1 leg gave out without cane    Has the patient had a decrease in activity level because of a fear of falling?   No    Is the patient reluctant to leave their home because of a fear of falling?   No      Home Environment   Living Environment  Private residence    Living Arrangements  Alone    Type of Home  Apartment    Home Access  Stairs to enter    Entrance Stairs-Number of Steps  4    Entrance Stairs-Rails  Right      Prior Function   Level of Independence  Requires assistive device for independence      Cognition   Overall Cognitive Status  Within Functional Limits for tasks assessed      Observation/Other Assessments   Focus on Therapeutic Outcomes (FOTO)   53% limited      Posture/Postural Control   Posture Comments  flexed posture  with decr weight to RT leg      ROM  / Strength   AROM / PROM / Strength  AROM;Strength;PROM      AROM   AROM Assessment Site  Lumbar    Lumbar Flexion  90    Lumbar Extension  0    Lumbar - Right Side Bend  5    Lumbar - Left Side Bend  5      PROM   Overall PROM Comments  LT hip SLR 95 degrees Flexion WNL ,   Hip ER 40 , IR 20, adduction 20 degrees.         Strength   Strength Assessment Site  Knee;Hip    Right/Left Hip  Right;Left    Right Hip Flexion  2-/5    Right Hip Extension  2+/5    Right Hip External Rotation   3-/5    Right Hip Internal Rotation  2+/5    Right Hip ABduction  2+/5    Left Hip Flexion  4+/5    Left Hip Extension  4+/5    Left Hip External Rotation  5/5    Left Hip Internal Rotation  5/5    Left Hip ABduction  4/5    Right/Left Knee  Right;Left    Right Knee Flexion  2+/5    Right Knee Extension  2+/5    Left Knee Flexion  5/5    Left Knee Extension  5/5      Palpation   Palpation comment  tender LT QL and lat and paraspinal       Ambulation/Gait   Gait Comments  SPC decr weight to RT leg              Objective measurements completed on examination: See above findings.                PT Short Term Goals - 04/28/17 2831      PT  SHORT TERM GOAL #1   Title  pt will be I with initial HEP     Time  3    Period  Weeks    Status  New      PT SHORT TERM GOAL #2   Title  She will demonstrate pain to <5/10 during the day with activity    Time  3    Period  Weeks    Status  New      PT SHORT TERM GOAL #4   Title  She will increase FOTO score by > 8 points to help with increased functional capacity     Time  3    Period  Weeks    Status  New        PT Long Term Goals - 04/28/17 1000      PT LONG TERM GOAL #1   Title  pt will be I with all HEP givne throughout therapy at discharge    Time  6    Period  Weeks    Status  New      PT LONG TERM GOAL #2   Title  She will report pain in flank decr to intermittant and max 2/10    Time  6    Period  Weeks     Status  New      PT LONG TERM GOAL #3   Title  She will be able to   sleep without wake due to pain     Time  6    Period  Weeks    Status  New      PT LONG TERM GOAL #4   Title  pt will be able to verbalize and demonstrate techniques to reduce flank pain reinjury via postural awareness, correct utilization of DME, lifting and carrying mechanics, HEP     Time  6    Period  Weeks    Status  New      PT LONG TERM GOAL #5   Title  She will increase her FOTO score to > 39% limited to demonstrate improved functional capacity upon discharge     Time  6    Period  Weeks    Status  New             Plan - 04/28/17 0935    Clinical Impression Statement  Ms Laneve reports chronic LT flank pain on top of chronic LBP , core weakness and RT leg weakness due to history of polio so incr strength not likely. . Her asymetry wit walking may contribute to her flank pain     which may make elimination of pain questionable but she should make some gains.     History and Personal Factors relevant to plan of care:  polio, weakness , abnormal posture . previous lumbar surgery.     Clinical Presentation  Stable    Clinical Presentation due to:  weakness , abnormal posture    Clinical Decision Making  Low    Rehab Potential  Good    PT Frequency  2x / week    PT Duration  6 weeks    PT Treatment/Interventions  Cryotherapy;Electrical Stimulation;Iontophoresis 4mg /ml Dexamethasone;Moist Heat;Therapeutic exercise;Passive range of motion;Manual techniques;Patient/family education;Dry needling    PT Next Visit Plan  HEP , modalites, manual    PT Home Exercise Plan  sidebending and sidebend with rotation stretching to RT.     Consulted and Agree with Plan of Care  Patient  Patient will benefit from skilled therapeutic intervention in order to improve the following deficits and impairments:  Pain, Obesity, Decreased activity tolerance, Postural dysfunction, Increased muscle spasms, Decreased  range of motion  Visit Diagnosis: Left flank pain  Cramp and spasm  Acute left-sided thoracic back pain  Abnormal posture  Muscle weakness (generalized)     Problem List Patient Active Problem List   Diagnosis Date Noted  . S/P lumbar fusion 07/11/2016  . Osteoarthritis of spine with radiculopathy, lumbar region 05/10/2016  . Fall 04/22/2016  . GERD (gastroesophageal reflux disease) 10/26/2014  . Healthcare maintenance 05/31/2014  . T2DM (type 2 diabetes mellitus) ([place]) 05/31/2014  . 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 Left flank pain 06/19/2012  . Obesity (BMI 30-39.9) 03/24/2012  . Left leg pain 09/30/2010  . Biceps tendon tear 08/10/2010  . Shoulder pain 08/10/2010  . HTN (hypertension) 08/03/2010  . Hyperlipidemia 08/03/2010  . Glaucoma 08/03/2010  . Radicular low back pain 08/03/2010    Darrel Hoover  PT 04/28/2017, 11:03 AM  [facility] [address] [place], [place], [address] Phone: [phone]   Fax:  [phone]  Name: Kylieann Eagles MRN: [id number] Date of Birth: 1945-12-15

## 2017-04-29 NOTE — Telephone Encounter (Signed)
Patient called back states she tried Cymbalta for additional 4 days and still has nausea, decreased appetite, generalized itching, and difficulty sleeping. Does not want to take this anymore. Hubbard Hartshorn, RN, BSN

## 2017-04-30 ENCOUNTER — Other Ambulatory Visit: Payer: Self-pay | Admitting: *Deleted

## 2017-04-30 MED ORDER — DICLOFENAC SODIUM 1 % TD GEL: 2.0000 g | Freq: Four times a day (QID) | TRANSDERMAL | 3 refills | Status: DC

## 2017-04-30 MED ORDER — PREGABALIN 25 MG PO CAPS
25.0000 mg | ORAL_CAPSULE | Freq: Two times a day (BID) | ORAL | 1 refills | Status: DC
Start: 2017-04-30 — End: 2017-08-04

## 2017-04-30 NOTE — Telephone Encounter (Signed)
Requesting 90 day supply. Laprecious Austill Kennon Holter, CMA

## 2017-04-30 NOTE — Telephone Encounter (Signed)
Called patient regarding Cymbalta. Patient no longer wishing to take this. Can start Lyrica instead. Prescription printed and left at front desk for patient to pick up. Says she will pick it up at her earliest convenience.   Adin Hector, MD, MPH PGY-3 [facility] Pager [phone]

## 2017-04-30 NOTE — Addendum Note (Signed)
Addended by: Adin Hector on: 04/30/2017 09:51 AM   Modules accepted: Orders

## 2017-05-07 ENCOUNTER — Encounter: Payer: Self-pay | Admitting: Physical Therapy

## 2017-05-07 ENCOUNTER — Ambulatory Visit: Payer: Medicare Other | Admitting: Physical Therapy

## 2017-05-07 DIAGNOSIS — M546 Pain in thoracic spine: Secondary | ICD-10-CM

## 2017-05-07 DIAGNOSIS — R252 Cramp and spasm: Secondary | ICD-10-CM

## 2017-05-07 DIAGNOSIS — R109 Unspecified abdominal pain: Secondary | ICD-10-CM | POA: Diagnosis not present

## 2017-05-07 DIAGNOSIS — R293 Abnormal posture: Secondary | ICD-10-CM | POA: Diagnosis not present

## 2017-05-07 DIAGNOSIS — M6281 Muscle weakness (generalized): Secondary | ICD-10-CM

## 2017-05-07 NOTE — Therapy (Signed)
[place], [place], [address] Phone: [phone]   Fax:  [phone]  Physical Therapy Treatment  Patient Details  Name: [name] MRN: [id number] Date of Birth: 1945/09/05 Referring Provider: Ruthann Cancer, MD   Encounter Date: 05/07/2017  PT End of Session - 05/07/17 1222    Visit Number  2    Number of Visits  12    Date for PT Re-Evaluation  06/06/17    Authorization Type  UHC  MCR    PT Start Time  0102    PT Stop Time  1239    PT Time Calculation (min)  54 min       Past Medical History:  Diagnosis Date  . Arthritis    osteoarthritis  . Carpal tunnel syndrome on both sides    Followed by Dr. Burney Gauze  . Cataracts, bilateral    immature  . Chronic back pain    stenosis  . DDD (degenerative disc disease)    Lumbar Spondylosis  . Diabetes mellitus    takes Metformin daily  . GERD (gastroesophageal reflux disease)    takes Omeprazole daily  . Glaucoma   . Hyperlipidemia    takes Atorvastatin daily  . Hypertension    takes Ramipril daily as well as HCTZ  . Muscle spasm    takes Baclofen as needed  . 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 Peripheral neuropathy    takes Gabapentin daily  . 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 Vitamin D deficiency    takes Vit D daily    Past Surgical History:  Procedure Laterality Date  . ABDOMINAL HYSTERECTOMY     partial  . BREAST BIOPSY     left-benign  . CHOLECYSTECTOMY    . COLONOSCOPY    . HAND SURGERY     Right wrist, s/p plate removal in Feb 2012  . JOINT REPLACEMENT  2006   left knee  . left hand surgery    . SPINE SURGERY      There were no vitals filed for this visit.  Subjective Assessment - 05/07/17 1151    Subjective  I tripped over someone's foot yesterday. I fell on my right knee.     Currently in Pain?  Yes    Pain Score  7     Pain Location  Back    Pain  Orientation  Lower;Right;Left    Pain Descriptors / Indicators  -- feeling of popped knuckles     Aggravating Factors   trying to roll over, bending over in the shower    Pain Relieving Factors  rest, sitting                       [facility] Adult PT Treatment/Exercise - 05/07/17 0001      Lumbar Exercises: Stretches   Other Lumbar Stretch Exercise  side lying left lat stretch reach over head     Other Lumbar Stretch Exercise  seated side bend stretches-she is unable to recall the other HEP stretch      Lumbar Exercises: Supine   Ab Set  10 reps    AB Set Limitations  abdominal draw in with breathing     Clam  10 reps    Clam Limitations  red band     Other Supine Lumbar Exercises  adduction red band around knees with ball squeeze and abdominal draw in  Manual Therapy   Manual Therapy  Soft tissue mobilization    Soft tissue mobilization  side lying, left lat, paraspinal, QL              PT Education - 05/07/17 1301    Education provided  Yes    Education Details  HEP    Person(s) Educated  Patient    Methods  Explanation;Handout    Comprehension  Verbalized understanding       PT Short Term Goals - 04/28/17 0936      PT SHORT TERM GOAL #1   Title  pt will be I with initial HEP     Time  3    Period  Weeks    Status  New      PT SHORT TERM GOAL #2   Title  She will demonstrate pain to <5/10 during the day with activity    Time  3    Period  Weeks    Status  New      PT SHORT TERM GOAL #4   Title  She will increase FOTO score by > 8 points to help with increased functional capacity     Time  3    Period  Weeks    Status  New        PT Long Term Goals - 04/28/17 1000      PT LONG TERM GOAL #1   Title  pt will be I with all HEP givne throughout therapy at discharge    Time  6    Period  Weeks    Status  New      PT LONG TERM GOAL #2   Title  She will report pain in flank decr to intermittant and max 2/10    Time  6    Period  Weeks     Status  New      PT LONG TERM GOAL #3   Title  She will be able to   sleep without wake due to pain     Time  6    Period  Weeks    Status  New      PT LONG TERM GOAL #4   Title  pt will be able to verbalize and demonstrate techniques to reduce flank pain reinjury via postural awareness, correct utilization of DME, lifting and carrying mechanics, HEP     Time  6    Period  Weeks    Status  New      PT LONG TERM GOAL #5   Title  She will increase her FOTO score to > 39% limited to demonstrate improved functional capacity upon discharge     Time  6    Period  Weeks    Status  New            Plan - 05/07/17 1222    Clinical Impression Statement  Pt arrives reporting fall on right knee at church but doing okay. Back pain about the same. Worked on lumbar stabilization and performed manual to left flank and lumar musculature in sidelying. Reviewed seated side bend. After manual, pt reports pain decreased. HMP applied at end of session.     PT Next Visit Plan  HEP , modalites, manual, check HEP    PT Home Exercise Plan  sidebending and sidebend with rotation stretching to RT. , supine clam (red) with abdominal draw in        Patient will benefit from skilled therapeutic intervention  in order to improve the following deficits and impairments:  Pain, Obesity, Decreased activity tolerance, Postural dysfunction, Increased muscle spasms, Decreased range of motion  Visit Diagnosis: Left flank pain  Cramp and spasm  Abnormal posture  Muscle weakness (generalized)  Acute left-sided thoracic back pain     Problem List Patient Active Problem List   Diagnosis Date Noted  . S/P lumbar fusion 07/11/2016  . Osteoarthritis of spine with radiculopathy, lumbar region 05/10/2016  . Fall 04/22/2016  . GERD (gastroesophageal reflux disease) 10/26/2014  . Healthcare maintenance 05/31/2014  . T2DM (type 2 diabetes mellitus) ([place]) 05/31/2014  . Status post left knee replacement  06/08/2013  . Left flank pain 06/19/2012  . Obesity (BMI 30-39.9) 03/24/2012  . Left leg pain 09/30/2010  . Biceps tendon tear 08/10/2010  . Shoulder pain 08/10/2010  . HTN (hypertension) 08/03/2010  . Hyperlipidemia 08/03/2010  . Glaucoma 08/03/2010  . Radicular low back pain 08/03/2010    Dorene Ar, PTA 05/07/2017, 1:03 PM  [place], [place], [address] Phone: [phone]   Fax:  [phone]  Name: Brookelyn Gaynor MRN: [id number] Date of Birth: May 17, 1945

## 2017-05-07 NOTE — Patient Instructions (Signed)
External Rotation: Hip - Knees Apart (Hook-Lying)    Lie with hips and knees bent, band tied just above knees. DRAW IN ABDOMINALS AND HOLD.  Pull knees apart. Hold for __5_ seconds. Rest for _5__ seconds. Repeat _10__ times. Do __2_ times a day. DO NOT HOLD BREATH Copyright  [employer]. All rights reserved.

## 2017-05-09 ENCOUNTER — Encounter: Payer: Self-pay | Admitting: Internal Medicine

## 2017-05-09 ENCOUNTER — Ambulatory Visit: Payer: Medicare Other | Attending: Family Medicine | Admitting: Physical Therapy

## 2017-05-09 ENCOUNTER — Encounter: Payer: Self-pay | Admitting: Physical Therapy

## 2017-05-09 DIAGNOSIS — R252 Cramp and spasm: Secondary | ICD-10-CM | POA: Diagnosis not present

## 2017-05-09 DIAGNOSIS — R109 Unspecified abdominal pain: Secondary | ICD-10-CM | POA: Insufficient documentation

## 2017-05-09 DIAGNOSIS — M6281 Muscle weakness (generalized): Secondary | ICD-10-CM | POA: Diagnosis not present

## 2017-05-09 DIAGNOSIS — R293 Abnormal posture: Secondary | ICD-10-CM | POA: Insufficient documentation

## 2017-05-09 DIAGNOSIS — M546 Pain in thoracic spine: Secondary | ICD-10-CM | POA: Insufficient documentation

## 2017-05-09 NOTE — Therapy (Signed)
[facility] [place], [place], [address] Phone: [phone]   Fax:  [phone]  Physical Therapy Treatment  Patient Details  Name: [name] MRN: [id number] Date of Birth: [birth year] Referring Provider: Ruthann Cancer, MD   Encounter Date: 05/09/2017  PT End of Session - 05/09/17 0931    Visit Number  3    Number of Visits  12    Date for PT Re-Evaluation  06/06/17    Authorization Type  UHC  MCR    PT Start Time  0931    PT Stop Time  1012    PT Time Calculation (min)  41 min    Activity Tolerance  Patient tolerated treatment well    Behavior During Therapy  [facility] for tasks assessed/performed       Past Medical History:  Diagnosis Date  . Arthritis    osteoarthritis  . Carpal tunnel syndrome on both sides    Followed by Dr. Burney Gauze  . Cataracts, bilateral    immature  . Chronic back pain    stenosis  . DDD (degenerative disc disease)    Lumbar Spondylosis  . Diabetes mellitus    takes Metformin daily  . GERD (gastroesophageal reflux disease)    takes Omeprazole daily  . Glaucoma   . Hyperlipidemia    takes Atorvastatin daily  . Hypertension    takes Ramipril daily as well as HCTZ  . Muscle spasm    takes Baclofen as needed  . 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 Peripheral neuropathy    takes Gabapentin daily  . 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 Vitamin D deficiency    takes Vit D daily    Past Surgical History:  Procedure Laterality Date  . ABDOMINAL HYSTERECTOMY     partial  . BREAST BIOPSY     left-benign  . CHOLECYSTECTOMY    . COLONOSCOPY    . HAND SURGERY     Right wrist, s/p plate removal in Feb 2012  . JOINT REPLACEMENT  2006   left knee  . left hand surgery    . SPINE SURGERY      There were no vitals filed for this visit.  Subjective Assessment - 05/09/17 0931    Subjective  Feeling sore today across low back.  Knee is doing better    Patient Stated Goals  She wants to ease nerve pain    Currently in Pain?  Yes    Pain Score  6     Pain Location  Back    Pain Orientation  Right;Left;Lower    Pain Descriptors / Indicators  Sore                      [facility] Adult PT Treatment/Exercise - 05/09/17 0001      Therapeutic Activites    Therapeutic Activities  Other Therapeutic Activities    Other Therapeutic Activities  bed mobility      Exercises   Exercises  Lumbar      Lumbar Exercises: Stretches   Other Lumbar Stretch Exercise  DKTC legs over physioball; seated rotation stretch    Other Lumbar Stretch Exercise  seated sidebend to Rt      Lumbar Exercises: Aerobic   Nustep  6 min L3 UE & LE      Lumbar Exercises: Seated   Other Seated Lumbar Exercises  rows red tband  Lumbar Exercises: Supine   Clam  15 reps    Clam Limitations  red band     Large Ball Abdominal Isometric  10 reps;5 seconds    Large Ball Oblique Isometric  10 reps;5 seconds both             PT Education - 05/09/17 0948    Education provided  Yes    Education Details  alignment and shoes, exercise form/rationale    Person(s) Educated  Patient    Methods  Explanation;Demonstration;Tactile cues;Verbal cues    Comprehension  Verbalized understanding;Need further instruction;Returned demonstration;Verbal cues required;Tactile cues required       PT Short Term Goals - 04/28/17 0936      PT SHORT TERM GOAL #1   Title  pt will be I with initial HEP     Time  3    Period  Weeks    Status  New      PT SHORT TERM GOAL #2   Title  She will demonstrate pain to <5/10 during the day with activity    Time  3    Period  Weeks    Status  New      PT SHORT TERM GOAL #4   Title  She will increase FOTO score by > 8 points to help with increased functional capacity     Time  3    Period  Weeks    Status  New        PT Long Term Goals - 04/28/17 1000      PT LONG TERM GOAL #1   Title  pt  will be I with all HEP givne throughout therapy at discharge    Time  6    Period  Weeks    Status  New      PT LONG TERM GOAL #2   Title  She will report pain in flank decr to intermittant and max 2/10    Time  6    Period  Weeks    Status  New      PT LONG TERM GOAL #3   Title  She will be able to   sleep without wake due to pain     Time  6    Period  Weeks    Status  New      PT LONG TERM GOAL #4   Title  pt will be able to verbalize and demonstrate techniques to reduce flank pain reinjury via postural awareness, correct utilization of DME, lifting and carrying mechanics, HEP     Time  6    Period  Weeks    Status  New      PT LONG TERM GOAL #5   Title  She will increase her FOTO score to > 39% limited to demonstrate improved functional capacity upon discharge     Time  6    Period  Weeks    Status  New            Plan - 05/09/17 0940    Clinical Impression Statement  Pt stands with Rt hip elevated due to inability to place Rt heel on the ground. Reports Corrective shoes and lifts/AFOs in rt shoe but has not had one in left and does not wear any of these consistently. Discussed how alignment can cause discomfort in back. I asked her to bring AFO to next visit so we can assess.     PT Treatment/Interventions  Cryotherapy;Electrical Stimulation;Iontophoresis 4mg /ml Dexamethasone;Moist  Heat;Therapeutic exercise;Passive range of motion;Manual techniques;Patient/family education;Dry needling    PT Next Visit Plan  pt bringing AFO-does her heel sit flat?, consider lift in Left shoe    PT Home Exercise Plan  sidebending and sidebend with rotation stretching to RT. , supine clam (red) with abdominal draw in     Consulted and Agree with Plan of Care  Patient       Patient will benefit from skilled therapeutic intervention in order to improve the following deficits and impairments:  Pain, Obesity, Decreased activity tolerance, Postural dysfunction, Increased muscle spasms,  Decreased range of motion  Visit Diagnosis: Left flank pain  Cramp and spasm  Abnormal posture  Muscle weakness (generalized)     Problem List Patient Active Problem List   Diagnosis Date Noted  . S/P lumbar fusion 07/11/2016  . Osteoarthritis of spine with radiculopathy, lumbar region 05/10/2016  . Fall 04/22/2016  . GERD (gastroesophageal reflux disease) 10/26/2014  . Healthcare maintenance 05/31/2014  . T2DM (type 2 diabetes mellitus) ([place]) 05/31/2014  . 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 Left flank pain 06/19/2012  . Obesity (BMI 30-39.9) 03/24/2012  . Left leg pain 09/30/2010  . Biceps tendon tear 08/10/2010  . Shoulder pain 08/10/2010  . HTN (hypertension) 08/03/2010  . Hyperlipidemia 08/03/2010  . Glaucoma 08/03/2010  . Radicular low back pain 08/03/2010    Aadi Bordner C. Aicia Babinski PT, DPT 05/09/17 10:13 AM   [facility] [address] [place], [place], [address] Phone: [phone]   Fax:  [phone]  Name: [name] MRN: [id number] Date of Birth: [birth year]

## 2017-05-12 ENCOUNTER — Ambulatory Visit: Payer: Medicare Other

## 2017-05-12 DIAGNOSIS — R109 Unspecified abdominal pain: Secondary | ICD-10-CM | POA: Diagnosis not present

## 2017-05-12 DIAGNOSIS — R252 Cramp and spasm: Secondary | ICD-10-CM

## 2017-05-12 DIAGNOSIS — M6281 Muscle weakness (generalized): Secondary | ICD-10-CM

## 2017-05-12 DIAGNOSIS — R293 Abnormal posture: Secondary | ICD-10-CM | POA: Diagnosis not present

## 2017-05-12 DIAGNOSIS — M546 Pain in thoracic spine: Secondary | ICD-10-CM | POA: Diagnosis not present

## 2017-05-12 NOTE — Therapy (Signed)
[name], [place], [address] Phone: [phone]   Fax:  [phone]  Physical Therapy Treatment  Patient Details  Name: [name] MRN: [id number] Date of Birth: 1946-03-24 Referring Provider: Ruthann Cancer, MD   Encounter Date: 05/12/2017  PT End of Session - 05/12/17 0958    Visit Number  4    Number of Visits  12    Date for PT Re-Evaluation  06/06/17    Authorization Type  UHC  MCR    PT Start Time  1000    PT Stop Time  1055    PT Time Calculation (min)  55 min    Activity Tolerance  Patient tolerated treatment well;No increased pain    Behavior During Therapy  [facility] for tasks assessed/performed       Past Medical History:  Diagnosis Date  . Arthritis    osteoarthritis  . Carpal tunnel syndrome on both sides    Followed by Dr. Burney Gauze  . Cataracts, bilateral    immature  . Chronic back pain    stenosis  . DDD (degenerative disc disease)    Lumbar Spondylosis  . Diabetes mellitus    takes Metformin daily  . GERD (gastroesophageal reflux disease)    takes Omeprazole daily  . Glaucoma   . Hyperlipidemia    takes Atorvastatin daily  . Hypertension    takes Ramipril daily as well as HCTZ  . Muscle spasm    takes Baclofen as needed  . 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 Peripheral neuropathy    takes Gabapentin daily  . 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 Vitamin D deficiency    takes Vit D daily    Past Surgical History:  Procedure Laterality Date  . ABDOMINAL HYSTERECTOMY     partial  . BREAST BIOPSY     left-benign  . CHOLECYSTECTOMY    . COLONOSCOPY    . HAND SURGERY     Right wrist, s/p plate removal in Feb 2012  . JOINT REPLACEMENT  2006   left knee  . left hand surgery    . SPINE SURGERY      There were no vitals filed for this visit.  Subjective Assessment - 05/12/17 1005    Subjective  A little LT back  pain     Currently in Pain?  Yes    Pain Score  3     Pain Location  Back    Pain Orientation  Left;Lower    Pain Descriptors / Indicators  Sore    Pain Type  Chronic pain    Aggravating Factors   bending , rolling in bed    Pain Relieving Factors  sit , rest                       [facility] Adult PT Treatment/Exercise - 05/12/17 0001      Self-Care   Self-Care  Other Self-Care Comments    Other Self-Care Comments   issued 3/8 heel lift  for LT shoe and she reported she felt fine walking with extra lift in shoe . Cautioned to remove if irritated  and let us know . Also told her she may be able to  have lift added to soel to decr hassel with filling shoe.       Lumbar Exercises: Stretches   Other Lumbar Stretch Exercise  legs on red  ball LTR x 15 RT/Lt and LE felxion ext x 15      Lumbar Exercises: Aerobic   Nustep  6 min L4 UE & LE      Lumbar Exercises: Seated   Other Seated Lumbar Exercises  rows red tband x12      Lumbar Exercises: Supine   Ab Set  10 reps    AB Set Limitations  abdominal draw in with breathing     Clam  15 reps    Clam Limitations  red band     Bridge with [employer]  15 reps    Large Ball Abdominal Isometric  10 reps;5 seconds      Modalities   Modalities  Moist Heat      Moist Heat Therapy   Number Minutes Moist Heat  15 Minutes    Moist Heat Location  Lumbar Spine LT right side lye      Manual Therapy   Soft tissue mobilization  side lying, left lat, paraspinal, QL                PT Short Term Goals - 05/12/17 1008      PT SHORT TERM GOAL #1   Title  pt will be I with initial HEP     Status  On-going      PT SHORT TERM GOAL #2   Title  She will demonstrate pain to <5/10 during the day with activity    Baseline  varies    Status  On-going      PT SHORT TERM GOAL #4   Title  She will increase FOTO score by > 8 points to help with increased functional capacity     Status  Unable to assess        PT Long Term  Goals - 04/28/17 1000      PT LONG TERM GOAL #1   Title  pt will be I with all HEP givne throughout therapy at discharge    Time  6    Period  Weeks    Status  New      PT LONG TERM GOAL #2   Title  She will report pain in flank decr to intermittant and max 2/10    Time  6    Period  Weeks    Status  New      PT LONG TERM GOAL #3   Title  She will be able to   sleep without wake due to pain     Time  6    Period  Weeks    Status  New      PT LONG TERM GOAL #4   Title  pt will be able to verbalize and demonstrate techniques to reduce flank pain reinjury via postural awareness, correct utilization of DME, lifting and carrying mechanics, HEP     Time  6    Period  Weeks    Status  New      PT LONG TERM GOAL #5   Title  She will increase her FOTO score to > 39% limited to demonstrate improved functional capacity upon discharge     Time  6    Period  Weeks    Status  New            Plan - 05/12/17 0962    Clinical Impression Statement  No change in postrue with AFO in place. LT leg continue short and heel lift inserted. Will  see if this  helps decr pain.     History and Personal Factors relevant to plan of care:       PT Treatment/Interventions  Cryotherapy;Electrical Stimulation;Iontophoresis 4mg /ml Dexamethasone;Moist Heat;Therapeutic exercise;Passive range of motion;Manual techniques;Patient/family education;Dry needling    PT Next Visit Plan  Continue manual and exercises /stretching, MHP     PT Home Exercise Plan  sidebending and sidebend with rotation stretching to RT. , supine clam (red) with abdominal draw in     Consulted and Agree with Plan of Care  Patient       Patient will benefit from skilled therapeutic intervention in order to improve the following deficits and impairments:  Pain, Obesity, Decreased activity tolerance, Postural dysfunction, Increased muscle spasms, Decreased range of motion  Visit Diagnosis: Left flank pain  Cramp and spasm  Abnormal  posture  Muscle weakness (generalized)  Acute left-sided thoracic back pain     Problem List Patient Active Problem List   Diagnosis Date Noted  . S/P lumbar fusion 07/11/2016  . Osteoarthritis of spine with radiculopathy, lumbar region 05/10/2016  . Fall 04/22/2016  . GERD (gastroesophageal reflux disease) 10/26/2014  . Healthcare maintenance 05/31/2014  . T2DM (type 2 diabetes mellitus) ([place]) 05/31/2014  . 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 Left flank pain 06/19/2012  . Obesity (BMI 30-39.9) 03/24/2012  . Left leg pain 09/30/2010  . Biceps tendon tear 08/10/2010  . Shoulder pain 08/10/2010  . HTN (hypertension) 08/03/2010  . Hyperlipidemia 08/03/2010  . Glaucoma 08/03/2010  . Radicular low back pain 08/03/2010    Darrel Hoover PT 05/12/2017, 10:54 AM  [facility] [address] [place], [place], [address] Phone: [phone]   Fax:  [phone]  Name: Larcenia Holaday MRN: [id number] Date of Birth: May 31, 1945

## 2017-05-14 ENCOUNTER — Encounter: Payer: Self-pay | Admitting: Physical Therapy

## 2017-05-14 ENCOUNTER — Ambulatory Visit: Payer: Medicare Other | Admitting: Physical Therapy

## 2017-05-14 DIAGNOSIS — R252 Cramp and spasm: Secondary | ICD-10-CM | POA: Diagnosis not present

## 2017-05-14 DIAGNOSIS — R109 Unspecified abdominal pain: Secondary | ICD-10-CM

## 2017-05-14 DIAGNOSIS — M6281 Muscle weakness (generalized): Secondary | ICD-10-CM | POA: Diagnosis not present

## 2017-05-14 DIAGNOSIS — R293 Abnormal posture: Secondary | ICD-10-CM | POA: Diagnosis not present

## 2017-05-14 DIAGNOSIS — M546 Pain in thoracic spine: Secondary | ICD-10-CM | POA: Diagnosis not present

## 2017-05-14 NOTE — Therapy (Deleted)
[facility], [place], [address] Phone: [phone]   Fax:  [phone]  Physical Therapy Treatment  Patient Details  Name: [name] MRN: [id number] Date of Birth: 06-16-1945 Referring Provider: Ruthann Cancer, MD   Encounter Date: 05/14/2017  PT End of Session - 05/14/17 1156    Visit Number  5    Number of Visits  12    Date for PT Re-Evaluation  06/06/17    Authorization Type  UHC  MCR    PT Start Time  9892    PT Stop Time  1245    PT Time Calculation (min)  60 min       Past Medical History:  Diagnosis Date  . Arthritis    osteoarthritis  . Carpal tunnel syndrome on both sides    Followed by Dr. Burney Gauze  . Cataracts, bilateral    immature  . Chronic back pain    stenosis  . DDD (degenerative disc disease)    Lumbar Spondylosis  . Diabetes mellitus    takes Metformin daily  . GERD (gastroesophageal reflux disease)    takes Omeprazole daily  . Glaucoma   . Hyperlipidemia    takes Atorvastatin daily  . Hypertension    takes Ramipril daily as well as HCTZ  . Muscle spasm    takes Baclofen as needed  . 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 Peripheral neuropathy    takes Gabapentin daily  . 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 Vitamin D deficiency    takes Vit D daily    Past Surgical History:  Procedure Laterality Date  . ABDOMINAL HYSTERECTOMY     partial  . BREAST BIOPSY     left-benign  . CHOLECYSTECTOMY    . COLONOSCOPY    . HAND SURGERY     Right wrist, s/p plate removal in Feb 2012  . JOINT REPLACEMENT  2006   left knee  . left hand surgery    . SPINE SURGERY      There were no vitals filed for this visit.                          PT Short Term Goals - 05/14/17 1300      PT SHORT TERM GOAL #1   Title  pt will be I with initial HEP     Time  3    Period  Weeks    Status  Achieved       PT SHORT TERM GOAL #2   Title  She will demonstrate pain to <5/10 during the day with activity    Baseline  varies, still high levels of pain wth mopping, cleaning tub    Time  3    Period  Weeks    Status  On-going      PT SHORT TERM GOAL #4   Title  She will increase FOTO score by > 8 points to help with increased functional capacity     Time  3    Period  Weeks    Status  Unable to assess        PT Long Term Goals - 04/28/17 1000      PT LONG TERM GOAL #1   Title  pt will be I with all HEP givne throughout therapy at discharge    Time  6    Period  Weeks    Status  New      PT LONG TERM GOAL #2   Title  She will report pain in flank decr to intermittant and max 2/10    Time  6    Period  Weeks    Status  New      PT LONG TERM GOAL #3   Title  She will be able to   sleep without wake due to pain     Time  6    Period  Weeks    Status  New      PT LONG TERM GOAL #4   Title  pt will be able to verbalize and demonstrate techniques to reduce flank pain reinjury via postural awareness, correct utilization of DME, lifting and carrying mechanics, HEP     Time  6    Period  Weeks    Status  New      PT LONG TERM GOAL #5   Title  She will increase her FOTO score to > 39% limited to demonstrate improved functional capacity upon discharge     Time  6    Period  Weeks    Status  New            Plan - 05/14/17 1155    Clinical Impression Statement  Pt reprots AFO slows her down. She felt less pain after soft tissue work last visit. Her pain now is mostly with turning over in bed. Her pain is better during the day but still has pain with mopping,cleaning bath tub and changing bed sheets. Discussed modification to these ADLS but this proves difficult due to RLE weakness. Left QL still tender. Performed soft tissue in sidelying. Reviewed Sidelying stretch for home.     PT Next Visit Plan  Continue manual and exercises /stretching, MHP     PT Home Exercise Plan   sidebending and sidebend with rotation stretching to RT. , supine clam (red) with abdominal draw in     Consulted and Agree with Plan of Care  Patient       Patient will benefit from skilled therapeutic intervention in order to improve the following deficits and impairments:  Pain, Obesity, Decreased activity tolerance, Postural dysfunction, Increased muscle spasms, Decreased range of motion  Visit Diagnosis: Left flank pain  Cramp and spasm  Abnormal posture  Muscle weakness (generalized)     Problem List Patient Active Problem List   Diagnosis Date Noted  . S/P lumbar fusion 07/11/2016  . Osteoarthritis of spine with radiculopathy, lumbar region 05/10/2016  . Fall 04/22/2016  . GERD (gastroesophageal reflux disease) 10/26/2014  . Healthcare maintenance 05/31/2014  . T2DM (type 2 diabetes mellitus) ([place]) 05/31/2014  . 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 Left flank pain 06/19/2012  . Obesity (BMI 30-39.9) 03/24/2012  . Left leg pain 09/30/2010  . Biceps tendon tear 08/10/2010  . Shoulder pain 08/10/2010  . HTN (hypertension) 08/03/2010  . Hyperlipidemia 08/03/2010  . Glaucoma 08/03/2010  . Radicular low back pain 08/03/2010    Dorene Ar , PTA 05/14/2017, 1:07 PM  [facility] [address] [place], [place], [address] Phone: [phone]   Fax:  [phone]  Name: Laura Caldas MRN: [id number] Date of Birth: 08-Feb-1946

## 2017-05-14 NOTE — Therapy (Signed)
[place], [place], [address] Phone: [phone]   Fax:  [phone]  Physical Therapy Treatment  Patient Details  Name: [name] MRN: [id number] Date of Birth: [birth year] Referring Provider: Ruthann Cancer, MD   Encounter Date: 05/14/2017  PT End of Session - 05/14/17 1156    Visit Number  5    Number of Visits  12    Date for PT Re-Evaluation  06/06/17    Authorization Type  UHC  MCR    PT Start Time  7902    PT Stop Time  1245    PT Time Calculation (min)  60 min       Past Medical History:  Diagnosis Date  . Arthritis    osteoarthritis  . Carpal tunnel syndrome on both sides    Followed by Dr. Burney Gauze  . Cataracts, bilateral    immature  . Chronic back pain    stenosis  . DDD (degenerative disc disease)    Lumbar Spondylosis  . Diabetes mellitus    takes Metformin daily  . GERD (gastroesophageal reflux disease)    takes Omeprazole daily  . Glaucoma   . Hyperlipidemia    takes Atorvastatin daily  . Hypertension    takes Ramipril daily as well as HCTZ  . Muscle spasm    takes Baclofen as needed  . 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 Peripheral neuropathy    takes Gabapentin daily  . 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 Vitamin D deficiency    takes Vit D daily    Past Surgical History:  Procedure Laterality Date  . ABDOMINAL HYSTERECTOMY     partial  . BREAST BIOPSY     left-benign  . CHOLECYSTECTOMY    . COLONOSCOPY    . HAND SURGERY     Right wrist, s/p plate removal in Feb 2012  . JOINT REPLACEMENT  2006   left knee  . left hand surgery    . SPINE SURGERY      There were no vitals filed for this visit.                   [facility] Adult PT Treatment/Exercise - 05/14/17 0001      Self-Care   Other Self-Care Comments   Discussed/return demo of modifications for painful ADLS.       Lumbar  Exercises: Stretches   Other Lumbar Stretch Exercise  legs on red ball LTR and knee flex/ext     Other Lumbar Stretch Exercise  seated sde bend to the right       Lumbar Exercises: Aerobic   Nustep  5 min L3 UE & LE      Lumbar Exercises: Seated   Other Seated Lumbar Exercises  rows red tband x12      Lumbar Exercises: Supine   Clam  15 reps    Clam Limitations  blue band     Bridge  15 reps blue band press open    Bridge with [employer]  15 reps    [employer] Abdominal Isometric  5 seconds;15 reps      Moist Heat Therapy   Number Minutes Moist Heat  15 Minutes    Moist Heat Location  Lumbar Spine      Manual Therapy   Soft tissue mobilization  side lying, left lat, paraspinal, QL  added biofreeze  PT Short Term Goals - 05/14/17 1300      PT SHORT TERM GOAL #1   Title  pt will be I with initial HEP     Time  3    Period  Weeks    Status  Achieved      PT SHORT TERM GOAL #2   Title  She will demonstrate pain to <5/10 during the day with activity    Baseline  varies, still high levels of pain wth mopping, cleaning tub    Time  3    Period  Weeks    Status  On-going      PT SHORT TERM GOAL #4   Title  She will increase FOTO score by > 8 points to help with increased functional capacity     Time  3    Period  Weeks    Status  Unable to assess        PT Long Term Goals - 04/28/17 1000      PT LONG TERM GOAL #1   Title  pt will be I with all HEP givne throughout therapy at discharge    Time  6    Period  Weeks    Status  New      PT LONG TERM GOAL #2   Title  She will report pain in flank decr to intermittant and max 2/10    Time  6    Period  Weeks    Status  New      PT LONG TERM GOAL #3   Title  She will be able to   sleep without wake due to pain     Time  6    Period  Weeks    Status  New      PT LONG TERM GOAL #4   Title  pt will be able to verbalize and demonstrate techniques to reduce flank pain reinjury via postural  awareness, correct utilization of DME, lifting and carrying mechanics, HEP     Time  6    Period  Weeks    Status  New      PT LONG TERM GOAL #5   Title  She will increase her FOTO score to > 39% limited to demonstrate improved functional capacity upon discharge     Time  6    Period  Weeks    Status  New            Plan - 05/14/17 1155    Clinical Impression Statement  Pt reprots AFO slows her down. She felt less pain after soft tissue work last visit. Her pain now is mostly with turning over in bed. Her pain is better during the day but still has pain with mopping,cleaning bath tub and changing bed sheets. Discussed modification to these ADLS but this proves difficult due to RLE weakness. Left QL still tender. Performed soft tissue in sidelying. Reviewed Sidelying stretch for home.     PT Next Visit Plan  Continue manual and exercises /stretching, MHP     PT Home Exercise Plan  sidebending and sidebend with rotation stretching to RT. , supine clam (red) with abdominal draw in     Consulted and Agree with Plan of Care  Patient       Patient will benefit from skilled therapeutic intervention in order to improve the following deficits and impairments:  Pain, Obesity, Decreased activity tolerance, Postural dysfunction, Increased muscle spasms, Decreased range of motion  Visit Diagnosis:  Left flank pain  Cramp and spasm  Abnormal posture  Muscle weakness (generalized)     Problem List Patient Active Problem List   Diagnosis Date Noted  . S/P lumbar fusion 07/11/2016  . Osteoarthritis of spine with radiculopathy, lumbar region 05/10/2016  . Fall 04/22/2016  . GERD (gastroesophageal reflux disease) 10/26/2014  . Healthcare maintenance 05/31/2014  . T2DM (type 2 diabetes mellitus) ([place]) 05/31/2014  . 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 Left flank pain 06/19/2012  . Obesity (BMI 30-39.9) 03/24/2012  . Left leg pain 09/30/2010  . Biceps tendon tear 08/10/2010   . Shoulder pain 08/10/2010  . HTN (hypertension) 08/03/2010  . Hyperlipidemia 08/03/2010  . Glaucoma 08/03/2010  . Radicular low back pain 08/03/2010    Dorene Ar, PTA 05/14/2017, 1:08 PM  [place], [place], [address] Phone: [phone]   Fax:  [phone]  Name: [name] MRN: [id number] Date of Birth: 1945/04/17

## 2017-05-19 ENCOUNTER — Ambulatory Visit: Payer: Medicare Other

## 2017-05-19 ENCOUNTER — Other Ambulatory Visit: Payer: Self-pay | Admitting: Family Medicine

## 2017-05-19 DIAGNOSIS — R109 Unspecified abdominal pain: Secondary | ICD-10-CM | POA: Diagnosis not present

## 2017-05-19 DIAGNOSIS — R293 Abnormal posture: Secondary | ICD-10-CM

## 2017-05-19 DIAGNOSIS — M546 Pain in thoracic spine: Secondary | ICD-10-CM

## 2017-05-19 DIAGNOSIS — M6281 Muscle weakness (generalized): Secondary | ICD-10-CM | POA: Diagnosis not present

## 2017-05-19 DIAGNOSIS — I10 Essential (primary) hypertension: Secondary | ICD-10-CM

## 2017-05-19 DIAGNOSIS — R252 Cramp and spasm: Secondary | ICD-10-CM

## 2017-05-19 NOTE — Telephone Encounter (Addendum)
[facility] pharmacy faxed a refill request for a 90-days supply for the following medications. Thanks CC  atorvastatin (LIPITOR) 40 MG tablet   hydrochlorothiazide (HYDRODIURIL) 25 MG tablet   ramipril (ALTACE) 10 MG capsule

## 2017-05-19 NOTE — Telephone Encounter (Signed)
Will forward to PCP  B[place], Dionne Bucy, MD, MPH [facility] 05/19/2017 5:02 PM

## 2017-05-19 NOTE — Therapy (Signed)
[place], [place], [address] Phone: [phone]   Fax:  [phone]  Physical Therapy Treatment  Patient Details  Name: [name] MRN: [id number] Date of Birth: [birth year] Referring Provider: Ruthann Cancer, MD   Encounter Date: 05/19/2017  PT End of Session - 05/19/17 1046    Visit Number  6    Number of Visits  12    Date for PT Re-Evaluation  06/06/17    Authorization Type  UHC  MCR    PT Start Time  1050    PT Stop Time  3570    PT Time Calculation (min)  55 min    Activity Tolerance  Patient tolerated treatment well;No increased pain    Behavior During Therapy  WFL for tasks assessed/performed       Past Medical History:  Diagnosis Date  . Arthritis    osteoarthritis  . Carpal tunnel syndrome on both sides    Followed by Dr. Burney Gauze  . Cataracts, bilateral    immature  . Chronic back pain    stenosis  . DDD (degenerative disc disease)    Lumbar Spondylosis  . Diabetes mellitus    takes Metformin daily  . GERD (gastroesophageal reflux disease)    takes Omeprazole daily  . Glaucoma   . Hyperlipidemia    takes Atorvastatin daily  . Hypertension    takes Ramipril daily as well as HCTZ  . Muscle spasm    takes Baclofen as needed  . 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 Peripheral neuropathy    takes Gabapentin daily  . 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 Vitamin D deficiency    takes Vit D daily    Past Surgical History:  Procedure Laterality Date  . ABDOMINAL HYSTERECTOMY     partial  . BREAST BIOPSY     left-benign  . CHOLECYSTECTOMY    . COLONOSCOPY    . HAND SURGERY     Right wrist, s/p plate removal in Feb 2012  . JOINT REPLACEMENT  2006   left knee  . left hand surgery    . SPINE SURGERY      There were no vitals filed for this visit.  Subjective Assessment - 05/19/17 1053    Subjective  A little better .  No back pain now . Pain mostly at night. Meds and stretching son't seem to help    Currently in Pain?  No/denies         [facility] PT Assessment - 05/19/17 0001      Observation/Other Assessments   Focus on Therapeutic Outcomes (FOTO)   46% limited  7 points better                  [facility] Adult PT Treatment/Exercise - 05/19/17 0001      Self-Care   Other Self-Care Comments   discussed preparing to sleep with light walk /exercise to warm muscles and use heat pad alll after prep for bed as going to bed muscles may be warmer to start and may stay more relaxed  for sleep      Lumbar Exercises: Stretches   Pelvic Tilt  15 reps      Lumbar Exercises: Aerobic   Nustep  6 min L4 UE & LE      Lumbar Exercises: Supine   Clam  20 reps    Clam Limitations  blue band  Bridge  20 reps blue band    Large Ball Abdominal Isometric  15 reps;5 seconds      Moist Heat Therapy   Number Minutes Moist Heat  15 Minutes    Moist Heat Location  Lumbar Spine      Manual Therapy   Manual Therapy  Joint mobilization    Joint Mobilization  PA glides x 30-50 reps  lower lumbar to lower -mid thoracic spine Gr 3       Soft tissue mobilization  side lying, left lat, paraspinal, QL and stretching manually             PT Education - 05/19/17 1130    Education provided  Yes    Education Details  options to try and make prep for sleep ease pain with lying /sleep    Person(s) Educated  Patient    Methods  Explanation;Verbal cues    Comprehension  Verbalized understanding       PT Short Term Goals - 05/19/17 1134      PT SHORT TERM GOAL #1   Title  pt will be I with initial HEP     Status  Achieved      PT SHORT TERM GOAL #2   Title  She will demonstrate pain to <5/10 during the day with activity    Baseline  varies, still high levels of pain wth mopping, cleaning tub    Status  Partially Met      PT SHORT TERM GOAL #3   Title  pt will be able to don/doff orthosis an utilize it  correctly  to correct leg length descrepancy to assist with functional progression and safe functional mobility (12/11/2014)    Status  Achieved      PT SHORT TERM GOAL #4   Title  She will increase FOTO score by > 8 points to help with increased functional capacity     Status  Unable to assess        PT Long Term Goals - 05/19/17 1135      PT LONG TERM GOAL #1   Title  pt will be I with all HEP givne throughout therapy at discharge    Status  On-going      PT LONG TERM GOAL #2   Title  She will report pain in flank decr to intermittant and max 2/10    Baseline  intermittant , at times > 2/10    Status  Partially Met      PT LONG TERM GOAL #3   Title  She will be able to   sleep without wake due to pain     Status  On-going      PT LONG TERM GOAL #4   Title  pt will be able to verbalize and demonstrate techniques to reduce flank pain reinjury via postural awareness, correct utilization of DME, lifting and carrying mechanics, HEP     Status  On-going      PT LONG TERM GOAL #5   Title  She will increase her FOTO score to > 39% limited to demonstrate improved functional capacity upon discharge     Status  Unable to assess            Plan - 05/19/17 1046    Clinical Impression Statement  She appears to be improved except PM pain and heavier home tasks due to leg weakness contributing to increased load to spine which I would expect will continue No real tenderness with  palpation and pressure to spine.    proabbly she will  be ready for discharge in 3-4 sessions    PT Treatment/Interventions  Cryotherapy;Electrical Stimulation;Iontophoresis 18m/ml Dexamethasone;Moist Heat;Therapeutic exercise;Passive range of motion;Manual techniques;Patient/family education;Dry needling    PT Next Visit Plan  Continue manual and exercises /stretching, MHP     PT Home Exercise Plan  sidebending and sidebend with rotation stretching to RT. , supine clam (red) with abdominal draw in     Consulted  and Agree with Plan of Care  Patient       Patient will benefit from skilled therapeutic intervention in order to improve the following deficits and impairments:  Pain, Obesity, Decreased activity tolerance, Postural dysfunction, Increased muscle spasms, Decreased range of motion  Visit Diagnosis: Left flank pain  Cramp and spasm  Abnormal posture  Muscle weakness (generalized)  Acute left-sided thoracic back pain     Problem List Patient Active Problem List   Diagnosis Date Noted  . S/P lumbar fusion 07/11/2016  . Osteoarthritis of spine with radiculopathy, lumbar region 05/10/2016  . Fall 04/22/2016  . GERD (gastroesophageal reflux disease) 10/26/2014  . Healthcare maintenance 05/31/2014  . T[place]DM ([place] 2 diabetes mellitus) [place] 05/31/2014  . 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 Left flank pain 06/19/2012  . Obesity (BMI 30-39.9) 03/24/2012  . Left leg pain 09/30/2010  . Biceps tendon tear 08/10/2010  . Shoulder pain 08/10/2010  . HTN (hypertension) 08/03/2010  . Hyperlipidemia 08/03/2010  . Glaucoma 08/03/2010  . Radicular low back pain 08/03/2010    CDarrel Hoover PT 05/19/2017, 11:42 AM  [facility] [address]Phone: [phone]  Fax:  [phone] Name: ESatia WingerMRN: [id number] of Birth: 2September 20, 1947

## 2017-05-20 MED ORDER — HYDROCHLOROTHIAZIDE 25 MG PO TABS: 25.0000 mg | ORAL_TABLET | Freq: Every day | ORAL | 3 refills | Status: DC

## 2017-05-20 MED ORDER — ATORVASTATIN CALCIUM 40 MG PO TABS: ORAL_TABLET | ORAL | 3 refills | Status: DC

## 2017-05-20 MED ORDER — RAMIPRIL 10 MG PO CAPS: ORAL_CAPSULE | ORAL | 3 refills | Status: DC

## 2017-05-23 ENCOUNTER — Ambulatory Visit: Payer: Medicare Other | Admitting: Physical Therapy

## 2017-05-23 ENCOUNTER — Encounter: Payer: Self-pay | Admitting: Physical Therapy

## 2017-05-23 DIAGNOSIS — R252 Cramp and spasm: Secondary | ICD-10-CM

## 2017-05-23 DIAGNOSIS — M6281 Muscle weakness (generalized): Secondary | ICD-10-CM | POA: Diagnosis not present

## 2017-05-23 DIAGNOSIS — R109 Unspecified abdominal pain: Secondary | ICD-10-CM | POA: Diagnosis not present

## 2017-05-23 DIAGNOSIS — M546 Pain in thoracic spine: Secondary | ICD-10-CM | POA: Diagnosis not present

## 2017-05-23 DIAGNOSIS — R293 Abnormal posture: Secondary | ICD-10-CM | POA: Diagnosis not present

## 2017-05-23 NOTE — Therapy (Signed)
[place], [place], [address] Phone: [phone]   Fax:  [phone]  Physical Therapy Treatment  Patient Details  Name: [name] MRN: [id number] Date of Birth: Jan 21, 1946 Referring Provider: Ruthann Cancer, MD   Encounter Date: 05/23/2017  PT End of Session - 05/23/17 1151    Visit Number  7    Number of Visits  12    Date for PT Re-Evaluation  06/06/17    Authorization Type  UHC  MCR    PT Start Time  2353    PT Stop Time  1240    PT Time Calculation (min)  55 min       Past Medical History:  Diagnosis Date  . Arthritis    osteoarthritis  . Carpal tunnel syndrome on both sides    Followed by Dr. Burney Gauze  . Cataracts, bilateral    immature  . Chronic back pain    stenosis  . DDD (degenerative disc disease)    Lumbar Spondylosis  . Diabetes mellitus    takes Metformin daily  . GERD (gastroesophageal reflux disease)    takes Omeprazole daily  . Glaucoma   . Hyperlipidemia    takes Atorvastatin daily  . Hypertension    takes Ramipril daily as well as HCTZ  . Muscle spasm    takes Baclofen as needed  . 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 Peripheral neuropathy    takes Gabapentin daily  . 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 Vitamin D deficiency    takes Vit D daily    Past Surgical History:  Procedure Laterality Date  . ABDOMINAL HYSTERECTOMY     partial  . BREAST BIOPSY     left-benign  . CHOLECYSTECTOMY    . COLONOSCOPY    . HAND SURGERY     Right wrist, s/p plate removal in Feb 2012  . JOINT REPLACEMENT  2006   left knee  . left hand surgery    . SPINE SURGERY      There were no vitals filed for this visit.  Subjective Assessment - 05/23/17 1148    Subjective  Left side is doing pretty good. Feel it more at night rather than during the day. Today right low back pain.     Currently in Pain?  Yes    Pain Score  7      Pain Location  Back    Pain Orientation  Right                      [facility] Adult PT Treatment/Exercise - 05/23/17 0001      Lumbar Exercises: Stretches   Other Lumbar Stretch Exercise  legs on red ball knee flex/ext, LTR       Lumbar Exercises: Aerobic   Nustep  declined      Lumbar Exercises: Supine   Pelvic Tilt  15 reps    Glut Set  15 reps    Clam  20 reps    Clam Limitations  blue band     Bridge  20 reps blue band      Moist Heat Therapy   Number Minutes Moist Heat  15 Minutes    Moist Heat Location  Lumbar Spine      Manual Therapy   Soft tissue mobilization  sidelying right hip massage roller  PT Short Term Goals - 05/19/17 1134      PT SHORT TERM GOAL #1   Title  pt will be I with initial HEP     Status  Achieved      PT SHORT TERM GOAL #2   Title  She will demonstrate pain to <5/10 during the day with activity    Baseline  varies, still high levels of pain wth mopping, cleaning tub    Status  Partially Met      PT SHORT TERM GOAL #3   Title  pt will be able to don/doff orthosis an utilize it correctly  to correct leg length descrepancy to assist with functional progression and safe functional mobility (12/11/2014)    Status  Achieved      PT SHORT TERM GOAL #4   Title  She will increase FOTO score by > 8 points to help with increased functional capacity     Status  Unable to assess        PT Long Term Goals - 05/19/17 1135      PT LONG TERM GOAL #1   Title  pt will be I with all HEP givne throughout therapy at discharge    Status  On-going      PT LONG TERM GOAL #2   Title  She will report pain in flank decr to intermittant and max 2/10    Baseline  intermittant , at times > 2/10    Status  Partially Met      PT LONG TERM GOAL #3   Title  She will be able to   sleep without wake due to pain     Status  On-going      PT LONG TERM GOAL #4   Title  pt will be able to verbalize and demonstrate techniques to  reduce flank pain reinjury via postural awareness, correct utilization of DME, lifting and carrying mechanics, HEP     Status  On-going      PT LONG TERM GOAL #5   Title  She will increase her FOTO score to > 39% limited to demonstrate improved functional capacity upon discharge     Status  Unable to assess              Patient will benefit from skilled therapeutic intervention in order to improve the following deficits and impairments:     Visit Diagnosis: Left flank pain  Cramp and spasm  Abnormal posture  Acute left-sided thoracic back pain  Muscle weakness (generalized)     Problem List Patient Active Problem List   Diagnosis Date Noted  . S/P lumbar fusion 07/11/2016  . Osteoarthritis of spine with radiculopathy, lumbar region 05/10/2016  . Fall 04/22/2016  . GERD (gastroesophageal reflux disease) 10/26/2014  . Healthcare maintenance 05/31/2014  . T2DM (type 2 diabetes mellitus) ([place]) 05/31/2014  . 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 Left flank pain 06/19/2012  . Obesity (BMI 30-39.9) 03/24/2012  . Left leg pain 09/30/2010  . Biceps tendon tear 08/10/2010  . Shoulder pain 08/10/2010  . HTN (hypertension) 08/03/2010  . Hyperlipidemia 08/03/2010  . Glaucoma 08/03/2010  . Radicular low back pain 08/03/2010    Dorene Ar, [place] 05/23/2017, 12:32 PM  [facility] [address] [place], [place], [address] Phone: [phone]   Fax:  [phone]  Name: Ryelee Albee MRN: [id number] Date of Birth: [birth year]

## 2017-05-25 ENCOUNTER — Other Ambulatory Visit: Payer: Self-pay | Admitting: Internal Medicine

## 2017-05-26 ENCOUNTER — Ambulatory Visit: Payer: Medicare Other

## 2017-05-28 ENCOUNTER — Encounter: Payer: Self-pay | Admitting: Physical Therapy

## 2017-05-28 ENCOUNTER — Ambulatory Visit: Payer: Medicare Other | Admitting: Physical Therapy

## 2017-05-28 DIAGNOSIS — R252 Cramp and spasm: Secondary | ICD-10-CM | POA: Diagnosis not present

## 2017-05-28 DIAGNOSIS — R293 Abnormal posture: Secondary | ICD-10-CM | POA: Diagnosis not present

## 2017-05-28 DIAGNOSIS — M6281 Muscle weakness (generalized): Secondary | ICD-10-CM

## 2017-05-28 DIAGNOSIS — M546 Pain in thoracic spine: Secondary | ICD-10-CM | POA: Diagnosis not present

## 2017-05-28 DIAGNOSIS — R109 Unspecified abdominal pain: Secondary | ICD-10-CM

## 2017-05-28 NOTE — Therapy (Addendum)
[place], [place], [address] Phone: [phone]   Fax:  [phone]  Physical Therapy Treatment/Discharge  Patient Details  Name: [name] MRN: [id number] Date of Birth: Oct 02, 1945 Referring Provider: Ruthann Cancer, MD   Encounter Date: 05/28/2017  PT End of Session - 05/28/17 1105    Visit Number  8    Number of Visits  12    Date for PT Re-Evaluation  06/06/17    Authorization Type  UHC  MCR    PT Start Time  1100    PT Stop Time  1153    PT Time Calculation (min)  53 min       Past Medical History:  Diagnosis Date  . Arthritis    osteoarthritis  . Carpal tunnel syndrome on both sides    Followed by Dr. Burney Gauze  . Cataracts, bilateral    immature  . Chronic back pain    stenosis  . DDD (degenerative disc disease)    Lumbar Spondylosis  . Diabetes mellitus    takes Metformin daily  . GERD (gastroesophageal reflux disease)    takes Omeprazole daily  . Glaucoma   . Hyperlipidemia    takes Atorvastatin daily  . Hypertension    takes Ramipril daily as well as HCTZ  . Muscle spasm    takes Baclofen as needed  . 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 Peripheral neuropathy    takes Gabapentin daily  . 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 Vitamin D deficiency    takes Vit D daily    Past Surgical History:  Procedure Laterality Date  . ABDOMINAL HYSTERECTOMY     partial  . BREAST BIOPSY     left-benign  . CHOLECYSTECTOMY    . COLONOSCOPY    . HAND SURGERY     Right wrist, s/p plate removal in Feb 2012  . JOINT REPLACEMENT  2006   left knee  . left hand surgery    . SPINE SURGERY      There were no vitals filed for this visit.  Subjective Assessment - 05/28/17 1101    Subjective  Feeling it on left side today. Pain is not as bad.     Currently in Pain?  Yes    Pain Score  3     Pain Location  Back    Pain Orientation   Left    Pain Descriptors / Indicators  Aching    Aggravating Factors   cleaning    Pain Relieving Factors  sit, resting          [facility] PT Assessment - 05/28/17 0001      Observation/Other Assessments   Focus on Therapeutic Outcomes (FOTO)   46% limited  7 points better status taken 05/19/2017                  [facility] Adult PT Treatment/Exercise - 05/28/17 0001      Lumbar Exercises: Aerobic   Nustep  declined      Lumbar Exercises: Seated   Other Seated Lumbar Exercises  rows red tband x12    Other Seated Lumbar Exercises  Side bend stretch 10 sec x 3 each way       Lumbar Exercises: Supine   Pelvic Tilt  15 reps    Glut Set  15 reps    Clam  20 reps    Clam Limitations  blue  band     Bridge  20 reps blue band    Bridge with [employer]  15 reps    [employer] Abdominal Isometric  15 reps;5 seconds x 10 obliques    Other Supine Lumbar Exercises  large ball lower trunk rotations x 10    Other Supine Lumbar Exercises  ball squeeze with abdominal draw in       Moist Heat Therapy   Number Minutes Moist Heat  15 Minutes    Moist Heat Location  Lumbar Spine               PT Short Term Goals - 05/28/17 1117      PT SHORT TERM GOAL #1   Title  pt will be I with initial HEP     Status  Achieved      PT SHORT TERM GOAL #2   Title  She will demonstrate pain to <5/10 during the day with activity    Baseline  she reports pain levels below 5/10 unless she is cleaning     Status  Partially Met      PT SHORT TERM GOAL #3   Title  pt will be able to don/doff orthosis an utilize it correctly  to correct leg length descrepancy to assist with functional progression and safe functional mobility (12/11/2014)    Status  Achieved      PT SHORT TERM GOAL #4   Title  She will increase FOTO score by > 8 points to help with increased functional capacity     Baseline  7 point improvement     Status  Partially Met        PT Long Term Goals - 05/28/17 1119      PT LONG  TERM GOAL #1   Title  pt will be I with all HEP givne throughout therapy at discharge    Status  Achieved      PT LONG TERM GOAL #2   Title  She will report pain in flank decr to intermittant and max 2/10    Baseline  intermittant, average 3/10 , up to 8/10 with heavy cleaning     Status  Not Met      PT LONG TERM GOAL #3   Title  She will be able to   sleep without wake due to pain     Baseline  better per patient, wakes intermittently with pain.     Status  Partially Met      PT LONG TERM GOAL #4   Title  pt will be able to verbalize and demonstrate techniques to reduce flank pain reinjury via postural awareness, correct utilization of DME, lifting and carrying mechanics, HEP     Baseline  Was given body mechanics education     Status  Achieved      PT LONG TERM GOAL #5   Title  She will increase her FOTO score to > 39% limited to demonstrate improved functional capacity upon discharge     Baseline  7 point improvement 46 % limited     Status  Not Met            Plan - 05/28/17 1139    Clinical Impression Statement  Mrs. Nicoletti reports pain of 3/10 on left side of low back today. Pain has been on different sides over the last 3 visits. She reports average pain 3/10 with increases up to severe pain with heavy cleaning. She reports less pain with sleep  and decreased frequency of waking sue to pain. Most goals achiveved or partially achieved. Pt is ready for discharge to HEP.     PT Next Visit Plan  discharge today    PT Home Exercise Plan  sidebending and sidebend with rotation stretching to RT. , supine clam (red) with abdominal draw in     Consulted and Agree with Plan of Care  Patient       Patient will benefit from skilled therapeutic intervention in order to improve the following deficits and impairments:  Pain, Obesity, Decreased activity tolerance, Postural dysfunction, Increased muscle spasms, Decreased range of motion  Visit Diagnosis: Left flank pain  Cramp and  spasm  Abnormal posture  Acute left-sided thoracic back pain  Muscle weakness (generalized)     Problem List Patient Active Problem List   Diagnosis Date Noted  . S/P lumbar fusion 07/11/2016  . Osteoarthritis of spine with radiculopathy, lumbar region 05/10/2016  . Fall 04/22/2016  . GERD (gastroesophageal reflux disease) 10/26/2014  . Healthcare maintenance 05/31/2014  . T2DM (type 2 diabetes mellitus) ([place]) 05/31/2014  . 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 Left flank pain 06/19/2012  . Obesity (BMI 30-39.9) 03/24/2012  . Left leg pain 09/30/2010  . Biceps tendon tear 08/10/2010  . Shoulder pain 08/10/2010  . HTN (hypertension) 08/03/2010  . Hyperlipidemia 08/03/2010  . Glaucoma 08/03/2010  . Radicular low back pain 08/03/2010    Dorene Ar, [place] 05/28/2017, 11:46 AM  [facility] [address] [place], [place], [address] Phone: [phone]   Fax:  [phone]  Name: Allsion Nogales MRN: [id number] Date of Birth: [birth year]                                                          PHYSICAL THERAPY DISCHARGE SUMMARY  Visits from Start of Care: 8  Current functional level related to goals / functional outcomes: See above    Remaining deficits: See above   Education / Equipment: HEP Plan: Patient agrees to discharge.  Patient goals were partially met. Patient is being discharged due to                                                     ?????    MAx benefit from PT at this time                                                                                                                                    Pearson Forster PT 07/21/17

## 2017-06-09 ENCOUNTER — Encounter: Payer: Self-pay | Admitting: Internal Medicine

## 2017-06-09 ENCOUNTER — Ambulatory Visit (INDEPENDENT_AMBULATORY_CARE_PROVIDER_SITE_OTHER): Payer: Medicare Other | Admitting: Internal Medicine

## 2017-06-09 VITALS — BP 115/70 | HR 70 | Temp 98.4°F | Ht 63.0 in | Wt 186.8 lb

## 2017-06-09 DIAGNOSIS — D509 Iron deficiency anemia, unspecified: Secondary | ICD-10-CM

## 2017-06-09 DIAGNOSIS — M25511 Pain in right shoulder: Secondary | ICD-10-CM | POA: Diagnosis not present

## 2017-06-09 DIAGNOSIS — E785 Hyperlipidemia, unspecified: Secondary | ICD-10-CM | POA: Diagnosis not present

## 2017-06-09 DIAGNOSIS — M25512 Pain in left shoulder: Secondary | ICD-10-CM

## 2017-06-09 DIAGNOSIS — E119 Type 2 diabetes mellitus without complications: Secondary | ICD-10-CM

## 2017-06-09 DIAGNOSIS — I10 Essential (primary) hypertension: Secondary | ICD-10-CM | POA: Diagnosis not present

## 2017-06-09 LAB — POCT GLYCOSYLATED HEMOGLOBIN (HGB A1C): HEMOGLOBIN A1C: 6.1

## 2017-06-09 NOTE — Progress Notes (Signed)
   Subjective:   Patient: [name]       Birthdate: 08-May-1945       MRN: [id number]      HPI  [name] is a 72 y.o. female presenting for bilateral shoulder pain.   Bilateral shoulder pain Began about one month ago. Has had shoulder pain in the past (last documented in 2012) which resolved with stretching and exercises. Describes pain worse with laying down and also with heavy lifting. Describes pain as located posterolaterally in both shoulders. Describes pain as sharp. No trauma or changes in activity preceding pain. Has been taking normal daily pain meds (Lyrica, baclofen, Voltaren gel) with no improvement in symptoms.   Smoking status reviewed. Patient is never smoker.   Review of Systems See HPI.     Objective:  Physical Exam  Constitutional: She is oriented to person, place, and time and well-developed, well-nourished, and in no distress.  HENT:  Head: Normocephalic and atraumatic.  Pulmonary/Chest: Effort normal. No respiratory distress.  Musculoskeletal:  TTP of both shoulders near [facility] joint. Full ROM bilaterally. 5/5 strength upper extremities bilaterally. Full grip strength bilaterally.   Neurological: She is alert and oriented to person, place, and time.  Skin: Skin is warm and dry.  Psychiatric: Affect and judgment normal.      Assessment & Plan:  Shoulder pain Recurrence of preexisting problem. Given bilateral nature, more consistent with osteoarthritis. Denies soreness, stiffness, or weakness, making polymyalgia rheumatica less likely. No preceding trauma and would not expect simultaneous bilateral MSK injury. No weakness noted on exam and full ROM which is reassuring. Does have TTP of AC joint, so could be most affected by OA in that area. Will obtain plain films given relatively abrupt onset of pain to rule out other etiologies and to assess extent of OA if present. Could consider joint injections vs adjusting patient's current pain med regimen  depending on imaging findings - DG L/R shoulder. Will call patient when results available and discuss next steps.     Adin Hector, MD, MPH PGY-3 [facility] Pager [phone]

## 2017-06-09 NOTE — Assessment & Plan Note (Signed)
Recurrence of preexisting problem. Given bilateral nature, more consistent with osteoarthritis. Denies soreness, stiffness, or weakness, making polymyalgia rheumatica less likely. No preceding trauma and would not expect simultaneous bilateral MSK injury. No weakness noted on exam and full ROM which is reassuring. Does have TTP of AC joint, so could be most affected by OA in that area. Will obtain plain films given relatively abrupt onset of pain to rule out other etiologies and to assess extent of OA if present. Could consider joint injections vs adjusting patient's current pain med regimen depending on imaging findings - DG L/R shoulder. Will call patient when results available and discuss next steps.

## 2017-06-09 NOTE — Patient Instructions (Signed)
It was nice seeing you today [name]!  Please go to [facility] at your earliest convenience to have your shoulder xrays performed. I will call you when I have the results and we will discuss the next steps.   If you have any questions or concerns, please feel free to call the clinic.   Be well,  Dr. Avon Gully

## 2017-06-10 LAB — CBC
HEMATOCRIT: 34.7 % (ref 34.0–46.6)
HEMOGLOBIN: 11.1 g/dL (ref 11.1–15.9)
MCH: 23.8 pg — ABNORMAL LOW (ref 26.6–33.0)
MCHC: 32 g/dL (ref 31.5–35.7)
MCV: 75 fL — ABNORMAL LOW (ref 79–97)
Platelets: 303 10*3/uL (ref 150–379)
RBC: 4.66 x10E6/uL (ref 3.77–5.28)
RDW: 16.9 % — ABNORMAL HIGH (ref 12.3–15.4)
WBC: 4.4 10*3/uL (ref 3.4–10.8)

## 2017-06-10 LAB — CMP14+EGFR
A/G RATIO: 1.7 (ref 1.2–2.2)
ALK PHOS: 65 IU/L (ref 39–117)
ALT: 15 IU/L (ref 0–32)
AST: 15 IU/L (ref 0–40)
Albumin: 4.2 g/dL (ref 3.5–4.8)
BILIRUBIN TOTAL: 0.4 mg/dL (ref 0.0–1.2)
BUN/Creatinine Ratio: 13 (ref 12–28)
BUN: 8 mg/dL (ref 8–27)
CHLORIDE: 101 mmol/L (ref 96–106)
CO2: 26 mmol/L (ref 20–29)
Calcium: 9.8 mg/dL (ref 8.7–10.3)
Creatinine, Ser: 0.63 mg/dL (ref 0.57–1.00)
GFR calc non Af Amer: 90 mL/min/{1.73_m2} (ref >59)
GFR, EST AFRICAN AMERICAN: 104 mL/min/{1.73_m2} (ref >59)
GLUCOSE: 104 mg/dL — AB (ref 65–99)
Globulin, Total: 2.5 g/dL (ref 1.5–4.5)
POTASSIUM: 4.3 mmol/L (ref 3.5–5.2)
Sodium: 140 mmol/L (ref 134–144)
TOTAL PROTEIN: 6.7 g/dL (ref 6.0–8.5)

## 2017-06-10 LAB — LIPID PANEL
CHOL/HDL RATIO: 2.1 ratio (ref 0.0–4.4)
Cholesterol, Total: 130 mg/dL (ref 100–199)
HDL: 62 mg/dL (ref >39)
LDL CALC: 53 mg/dL (ref 0–99)
Triglycerides: 76 mg/dL (ref 0–149)
VLDL Cholesterol Cal: 15 mg/dL (ref 5–40)

## 2017-06-11 ENCOUNTER — Ambulatory Visit
Admission: RE | Admit: 2017-06-11 | Discharge: 2017-06-11 | Disposition: A | Discharge status: Home / Self Care | Payer: Medicare Other | Source: Ambulatory Visit | Attending: Family Medicine | Admitting: Family Medicine

## 2017-06-11 DIAGNOSIS — M19011 Primary osteoarthritis, right shoulder: Secondary | ICD-10-CM | POA: Diagnosis not present

## 2017-06-11 DIAGNOSIS — M25512 Pain in left shoulder: Principal | ICD-10-CM

## 2017-06-11 DIAGNOSIS — M19012 Primary osteoarthritis, left shoulder: Secondary | ICD-10-CM | POA: Diagnosis not present

## 2017-06-11 DIAGNOSIS — M25511 Pain in right shoulder: Secondary | ICD-10-CM

## 2017-06-12 ENCOUNTER — Telehealth: Payer: Self-pay | Admitting: Internal Medicine

## 2017-06-12 NOTE — Telephone Encounter (Signed)
Pt returned your call. Her call back number [phone] Wallace Cullens, RN

## 2017-06-12 NOTE — Telephone Encounter (Signed)
Called patient to discuss results of XR. No answer, left message. Mild OA with some spurring noted in [facility] joints bilaterally, likely explaining patient's pain as suspected. Will give patient option to be seen by sports med/ortho for possible injections, or to begin conservative management with anti-inflammatories. Will call again later.   Adin Hector, MD, MPH PGY-3 [facility] Pager [phone]

## 2017-06-12 NOTE — Telephone Encounter (Signed)
Called patient again to discuss XR results. No answer. Will call again tomorrow.   Adin Hector, MD, MPH PGY-3 [facility] Pager [phone]

## 2017-06-13 ENCOUNTER — Telehealth: Payer: Self-pay | Admitting: Internal Medicine

## 2017-06-13 NOTE — Telephone Encounter (Signed)
Returned patient's call at number requested. No answer. Will call again Monday.   Adin Hector, MD, MPH PGY-3 [facility] Pager [phone]

## 2017-06-13 NOTE — Telephone Encounter (Signed)
Pt left message on Nurse line- returning Dr. Justus Memory call. Pt call back [phone] Wallace Cullens, RN

## 2017-06-16 ENCOUNTER — Other Ambulatory Visit: Payer: Self-pay | Admitting: Internal Medicine

## 2017-06-16 DIAGNOSIS — M19012 Primary osteoarthritis, left shoulder: Principal | ICD-10-CM

## 2017-06-16 DIAGNOSIS — M19011 Primary osteoarthritis, right shoulder: Secondary | ICD-10-CM

## 2017-06-16 NOTE — Progress Notes (Signed)
Patient with bilateral AC joint OA on recent plain films. Patient say oral medications are not working for her and she would like to have joint injections. Placed referral to sports med for possible injections per patient's request.   Adin Hector, MD, MPH PGY-3 [facility] Pager [phone]

## 2017-06-20 ENCOUNTER — Encounter: Payer: Self-pay | Admitting: Family Medicine

## 2017-06-20 ENCOUNTER — Ambulatory Visit: Payer: Self-pay

## 2017-06-20 ENCOUNTER — Ambulatory Visit (INDEPENDENT_AMBULATORY_CARE_PROVIDER_SITE_OTHER): Payer: Medicare Other | Admitting: Family Medicine

## 2017-06-20 VITALS — BP 109/60 | Ht 63.0 in | Wt 186.0 lb

## 2017-06-20 DIAGNOSIS — M25511 Pain in right shoulder: Principal | ICD-10-CM

## 2017-06-20 DIAGNOSIS — M19011 Primary osteoarthritis, right shoulder: Secondary | ICD-10-CM | POA: Diagnosis not present

## 2017-06-20 DIAGNOSIS — G8929 Other chronic pain: Secondary | ICD-10-CM | POA: Diagnosis not present

## 2017-06-20 DIAGNOSIS — M25512 Pain in left shoulder: Secondary | ICD-10-CM | POA: Diagnosis not present

## 2017-06-20 MED ORDER — METHYLPREDNISOLONE ACETATE 40 MG/ML IJ SUSP
40.0000 mg | Freq: Once | INTRAMUSCULAR | Status: AC
  Administered 2017-06-20: 40 mg via INTRA_ARTICULAR

## 2017-06-20 NOTE — Patient Instructions (Signed)
Today we injected your right Acromioclavicular joint. Let me see you in 3-4 weeks  Today you received an injection with corticosteroid. This injection is usually done in response to pain and inflammation. There is some "numbing" medicine also in the shot so the injected area may be numb and feel really good for the next couple of hours. The numbing medicine usually wears off in 2-3 hours though, and then your pain level will be right back where it was before the injection.   The actually benefit from the steroid injection is usually noticed in 2-7 days. You may actually experience a small (as in 10%) INCREASE in pain in the first 24 hours---that is common.   Things to watch out for that you should contact us or a health care provider urgently would include: 1. Unusual (as in more than 10%) increase in pain 2. New fever > 101.5 3. New swelling or redness of the injected area.  4. Streaking of red lines around the area injected.

## 2017-06-20 NOTE — Progress Notes (Signed)
  [name] - 72 y.o. female MRN [id number]  Date of birth: [birth year]    SUBJECTIVE:      Chief Complaint:/ HPI:  Bilateral right greater than left shoulder pain 4-8 weeks perhaps longer.  She has some mild to moderate pain in the left shoulder but right shoulder bothers her all the time.  She has difficulty sleeping secondary to aching pain at night.  Is a cane in her left hand.  She is right-hand dominant.  She has history of polio on the right side. Shoulder pain on the right keeps her from doing many activities particularly those above shoulder height.  She has no numbness in her hand.  She is not noted any fever or unusual warmth of the shoulders.   ROS:     See HPI.  PERTINENT  PMH / PSH FH / / SH:  Past Medical, Surgical, Social, and Family History Reviewed & Updated in the EMR.  Pertinent findings include:  History of partial left tendon tear Hypertension Diabetes mellitus type 2 Status post left knee TKR BMI 30-39 Status post lumbar fusion  OBJECTIVE: BP 109/60   Ht 5\' 3"  (1.6 m)   Wt 186 lb (84.4 kg)   BMI 32.95 kg/m   Physical Exam:  Vital signs are reviewed. GENERAL: The stress Shoulders: Symmetrical.  Mild deformity of the right acromioclavicular joint.  Full passive range of motion right shoulder.  Intact strength in all planes the rotator she is limited some of the pain in abduction above 90 degrees.  Crossover test positive.  Supraspinatus testing also some pain. VASCULAR: Radial pulses 2+ bilaterally symmetrical SKIN: Area of the right shoulder is without any sign of erythema, no lesions, no unusual.  IMAGING: Bilateral shoulder x-rays are reviewed which show some mild to moderate glenohumeral joint narrowing and right greater than left moderate joint degenerative changes  ULTRASOUND: Shoulder, right.  Small amount of fluid around the biceps tendon biceps tendon appears to be intact and well seated.  She has some chronic calcifications in the subscapularis  and the supraspinatus tendon without any sign of full-thickness tear.  The acromioclavicular joint has severe degenerative arthritis with spurring, synovial, deformity.  There is no increased Doppler activity noted over the acromioclavicular joint.  The glenoid/labrum area is seen moderately well without any obvious deformity.   PROCEDURE: INJECTION: Patient was given informed consent, signed copy in the chart. Appropriate time out was taken. Area prepped and draped in usual sterile fashion. Ethyl chloride was  used for local anesthesia. A 21 gauge 1 1/2 inch needle was used.. 1 cc of methylprednisolone 40 mg/ml plus 1 cc of 1% lidocaine without epinephrine was injected into the right acromioclavicular joint using a(n) ultrasound-guided sterile approach.   The patient tolerated the procedure well. There were no complications. Post procedure instructions were given.  ASSESSMENT & PLAN:  See problem based charting & AVS for pt instructions.

## 2017-06-26 ENCOUNTER — Telehealth: Payer: Self-pay

## 2017-06-26 NOTE — Telephone Encounter (Signed)
Patient would like diabetic supplies refilled to [facility] at [phone]. There is not a form from them in [employer]. May have to call in. Danley Danker, RN [facility] RN)

## 2017-06-27 NOTE — Telephone Encounter (Signed)
Called patient to clarify need for diabetic supplies, as only taking metformin with most recent A1C 6.1. Patient says she still checks her blood sugar twice daily. When discussing that this is not necessary as her blood sugar is so far below goal for her age and she is only taking metformin, patient became defensive, stating that she needs to continue checking her blood sugar because sometimes her blood sugar is a bit off and she has had three friends/relatives requires dialysis due to diabetes. Again affirmed that it is not necessary to continue checking her blood sugar, but agreed to call [facility] for her. Called to confirm order for test strips. [place] said a form will be faxed to [facility] in about 24 hrs to be completed and faxed back. Called patient again to inform her of this. Patient voiced understanding. Also informed patient of her most recent A1C 3 times throughout the two phone calls.   Adin Hector, MD, MPH PGY-3 [facility] Pager [phone]

## 2017-07-04 DIAGNOSIS — E1140 Type 2 diabetes mellitus with diabetic neuropathy, unspecified: Secondary | ICD-10-CM | POA: Diagnosis not present

## 2017-07-04 NOTE — Telephone Encounter (Signed)
Received form today. Completed and placed in pile to be faxed.   Adin Hector, MD, MPH PGY-3 [facility] Pager [phone]

## 2017-07-04 NOTE — Telephone Encounter (Signed)
Patient calling about this again. Has fax been received by you from [place]? Danley Danker, RN [facility] RN)

## 2017-07-10 ENCOUNTER — Emergency Department (HOSPITAL_COMMUNITY)
Admission: EM | Admit: 2017-07-10 | Discharge: 2017-07-10 | Disposition: A | Discharge status: Home / Self Care | Payer: Medicare Other | Attending: Emergency Medicine | Admitting: Emergency Medicine

## 2017-07-10 ENCOUNTER — Emergency Department (HOSPITAL_COMMUNITY): Payer: Medicare Other

## 2017-07-10 ENCOUNTER — Other Ambulatory Visit: Payer: Self-pay

## 2017-07-10 ENCOUNTER — Encounter (HOSPITAL_COMMUNITY): Payer: Self-pay | Admitting: *Deleted

## 2017-07-10 DIAGNOSIS — Z5321 Procedure and treatment not carried out due to patient leaving prior to being seen by health care provider: Secondary | ICD-10-CM | POA: Insufficient documentation

## 2017-07-10 DIAGNOSIS — M25511 Pain in right shoulder: Secondary | ICD-10-CM | POA: Insufficient documentation

## 2017-07-10 DIAGNOSIS — R079 Chest pain, unspecified: Secondary | ICD-10-CM | POA: Diagnosis not present

## 2017-07-10 LAB — BASIC METABOLIC PANEL
Anion gap: 10 (ref 5–15)
BUN: 11 mg/dL (ref 6–20)
CHLORIDE: 104 mmol/L (ref 101–111)
CO2: 25 mmol/L (ref 22–32)
Calcium: 9.7 mg/dL (ref 8.9–10.3)
Creatinine, Ser: 0.81 mg/dL (ref 0.44–1.00)
GFR calc non Af Amer: >60 mL/min (ref >60)
Glucose, Bld: 112 mg/dL — ABNORMAL HIGH (ref 65–99)
Potassium: 3.9 mmol/L (ref 3.5–5.1)
SODIUM: 139 mmol/L (ref 135–145)

## 2017-07-10 LAB — CBC
HCT: 35.4 % — ABNORMAL LOW (ref 36.0–46.0)
Hemoglobin: 10.7 g/dL — ABNORMAL LOW (ref 12.0–15.0)
MCH: 23 pg — ABNORMAL LOW (ref 26.0–34.0)
MCHC: 30.2 g/dL (ref 30.0–36.0)
MCV: 76 fL — AB (ref 78.0–100.0)
Platelets: 309 10*3/uL (ref 150–400)
RBC: 4.66 MIL/uL (ref 3.87–5.11)
RDW: 16.6 % — ABNORMAL HIGH (ref 11.5–15.5)
WBC: 6.6 10*3/uL (ref 4.0–10.5)

## 2017-07-10 LAB — I-STAT TROPONIN, ED: Troponin i, poc: 0 ng/mL (ref 0.00–0.08)

## 2017-07-10 NOTE — ED Triage Notes (Signed)
Pt reports tripping and falling earlier today, landed on right side. Denies hitting head, denies loc. Has right side chest wall pain and right shoulder pain. Pain increases with movement and palpation.

## 2017-07-11 ENCOUNTER — Ambulatory Visit (INDEPENDENT_AMBULATORY_CARE_PROVIDER_SITE_OTHER): Payer: Medicare Other | Admitting: Family Medicine

## 2017-07-11 DIAGNOSIS — M25511 Pain in right shoulder: Secondary | ICD-10-CM

## 2017-07-11 MED ORDER — METHYLPREDNISOLONE ACETATE 40 MG/ML IJ SUSP
40.0000 mg | Freq: Once | INTRAMUSCULAR | Status: AC
  Administered 2017-07-11: 40 mg via INTRA_ARTICULAR

## 2017-07-11 NOTE — Progress Notes (Signed)
    CHIEF COMPLAINT / HPI: Right shoulder pain.  Gave her in acromioclavicular joint injection at last office visit she was about 50% better.  Unfortunately last night she fell onto her right shoulder feels like it is more painful today.  Department where they did a chest x-ray.  Pain is constant, 10 out of 10 in the right shoulder.  She can move all her fingers.  No numbness or tingling in her upper extremity.  REVIEW OF SYSTEMS: No fever.  PERTINENT  PMH / PSH: I have reviewed the patient's medications, allergies, past medical and surgical history, smoking status and updated in the EMR as appropriate.   OBJECTIVE: GENERAL: Well-developed female no acute distress although she is in quite a bit of pain when she moves her right shoulder. SHOULDER: Right: Lateral abduction full range of motion passively.  She has pain above 90 degrees lateral abduction and 110 degrees forward flexion.  Internal and external rotation is intact.  The bicep tendon is nontender to palpation.  He still has some tenderness to palpation of the acromioclavicular joint.  Skin: There is no bruising around the shoulder joint.  PROCEDURE: INJECTION: Patient was given informed consent, signed copy in the chart. Appropriate time out was taken. Area prepped and draped in usual sterile fashion. Ethyl chloride was  used for local anesthesia. A 21 gauge 1 1/2 inch needle was used.. 1 cc of methylprednisolone 40 mg/ml plus 4 cc of 1% lidocaine without epinephrine was injected into the right subacromial bursa using a(n).  Posterior approach.   The patient tolerated the procedure well. There were no complications. Post procedure instructions were given.   ASSESSMENT / PLAN: #1.  Shoulder pain.  Combination of acromioclavicular joint degenerative disease and recent fall.  I think she had some improvement after that acromioclavicular joint injection but it is hard to tell now that she is had a subsequent fall.  He does have quite a bit  of pain today.  I reviewed her chest x-ray that shows no fracture of the clavicle, no other gross bony pathology.  Does not appear to be dislocated.  We will give her a subacromial injection today and have her do pendulum exercises and see her back in 10-14 days.

## 2017-07-17 ENCOUNTER — Other Ambulatory Visit: Payer: Self-pay | Admitting: Internal Medicine

## 2017-07-28 DIAGNOSIS — H401121 Primary open-angle glaucoma, left eye, mild stage: Secondary | ICD-10-CM | POA: Diagnosis not present

## 2017-08-04 ENCOUNTER — Other Ambulatory Visit: Payer: Self-pay

## 2017-08-04 ENCOUNTER — Encounter: Payer: Self-pay | Admitting: Internal Medicine

## 2017-08-04 ENCOUNTER — Other Ambulatory Visit: Payer: Self-pay | Admitting: Ophthalmology

## 2017-08-04 ENCOUNTER — Ambulatory Visit (INDEPENDENT_AMBULATORY_CARE_PROVIDER_SITE_OTHER): Payer: Medicare Other | Admitting: Internal Medicine

## 2017-08-04 VITALS — BP 140/74 | HR 70 | Temp 97.7°F | Ht 63.0 in | Wt 190.4 lb

## 2017-08-04 DIAGNOSIS — R109 Unspecified abdominal pain: Secondary | ICD-10-CM

## 2017-08-04 DIAGNOSIS — H05821 Myopathy of extraocular muscles, right orbit: Secondary | ICD-10-CM

## 2017-08-04 DIAGNOSIS — H5010 Unspecified exotropia: Secondary | ICD-10-CM

## 2017-08-04 MED ORDER — GABAPENTIN 400 MG PO CAPS: ORAL_CAPSULE | ORAL | 2 refills | Status: DC

## 2017-08-04 NOTE — Assessment & Plan Note (Signed)
Chronic issue. Worse as patient has self-discontinued majority of medication regimen due to reported side effects. Patient wishing to resume gabapentin. As such, will resume dose that she was on prior to discontinuing, though discussed tapering to reduce side effects. Will gradually taper over the next few weeks back to dose of 800mg  in AM and at lunch, and 1200mg  qhs. Can continue baclofen 10mg  TID as well as Voltaren. Discontinue Cymbalta and Lyrica. F/u with Dr. Nori Riis for shoulder injection as planned in 4d.

## 2017-08-04 NOTE — Patient Instructions (Signed)
It was nice seeing you again today Ms. [name]!  Please resume gabapentin slowly. You can start by taking one pill in the morning for 3 days, then one pill at morning and at bedtime for 3 days, then one pill at morning, lunch, and bedtime for 3 days. After that, you can continue to add one pill every 3 days until you are taking two pills in the morning and at lunchtime, and three pills at bedtime.   I will call you when your lab results are available.   If you have any questions or concerns, please feel free to call the clinic.   Be well,  Dr. Avon Gully

## 2017-08-04 NOTE — Assessment & Plan Note (Signed)
Has already been evaluated by ophthalmology for this issue, who recommended further work-up for myasthenia gravis. Eye weakness is consistent with this, however does not report worsening at the end of day as would expect. Does not have any new weakness otherwise, though is reporting dysphagia, which can be seen with MG. Will obtain MG panel per ophtho request. Will call patient with results and fax to ophtho (Dr. Tama High) when available.

## 2017-08-04 NOTE — Progress Notes (Signed)
Subjective:   Patient: [name]       Birthdate: [birth year]       MRN: [id number]      HPI  Pebbles Albin is a 72 y.o. female presenting for R eye weakness and chronic pain.   R eye weakness Patient seen by her ophthalmologist Dr. Tama High last week due to R eye weakness. She says that her R eye drifts to the R. Denies blurry vision, just eye drifting. Dr. Manuella Ghazi concerned that patient may have myasthenia gravis with ocular involvement, so requested patient see her PCP to obtain MG panel. Patient says that the weakness does not seem to be worse at the end of the day. She has not noticed any new weakness elsewhere (does have weakness in R leg but this is not a new issue). Endorses some dysphagia with solids occurring every couple week. Never so severe that she chokes or gags.   Chronic pain Mostly located in L side after spinal fusion. Also has pain in shoulders for which she receives joint injections by Dr. Nori Riis (has appt for another injection in 4d). Was taking gabapentin, but said this was no longer effective, so asked for this to be discontinued. Has also taken Lyrica in the past, which she said she was unable to tolerate. Started her on Cymbalta a few months ago, which patient says caused her to itch and have difficulty sleeping at night. She self-discontinued Cymbalta after 4-5 days. Is still taking baclofen 10mg  TID which she says helps somewhat. Is also using Voltaren gel which she says helps as well. Would like to resume gabapentin at the dose she was previously on.   Smoking status reviewed. Patient is never smoker.   Review of Systems See HPI.     Objective:  Physical Exam  Constitutional: She is oriented to person, place, and time. She appears well-developed and well-nourished.  HENT:  Head: Normocephalic and atraumatic.  Eyes:  R eye with intermittent lateral drift. R eyelid with some drooping as well. No abnormalities of L eye.   Cardiovascular: Normal  rate.  Pulmonary/Chest: Effort normal. No respiratory distress.  Musculoskeletal:  Able to walk, sit, and stand without difficulty using cane.   Neurological: She is alert and oriented to person, place, and time.  Skin: Skin is warm and dry.  Psychiatric: She has a normal mood and affect. Her behavior is normal.      Assessment & Plan:  Left flank pain Chronic issue. Worse as patient has self-discontinued majority of medication regimen due to reported side effects. Patient wishing to resume gabapentin. As such, will resume dose that she was on prior to discontinuing, though discussed tapering to reduce side effects. Will gradually taper over the next few weeks back to dose of 800mg  in AM and at lunch, and 1200mg  qhs. Can continue baclofen 10mg  TID as well as Voltaren. Discontinue Cymbalta and Lyrica. F/u with Dr. Nori Riis for shoulder injection as planned in 4d.   Eye muscle weakness, right Has already been evaluated by ophthalmology for this issue, who recommended further work-up for myasthenia gravis. Eye weakness is consistent with this, however does not report worsening at the end of day as would expect. Does not have any new weakness otherwise, though is reporting dysphagia, which can be seen with MG. Will obtain MG panel per ophtho request. Will call patient with results and fax to ophtho (Dr. Tama High) when available.    Adin Hector, MD, MPH PGY-3 [facility] Pager  [phone]  

## 2017-08-05 DIAGNOSIS — M4150 Other secondary scoliosis, site unspecified: Secondary | ICD-10-CM | POA: Diagnosis not present

## 2017-08-06 ENCOUNTER — Other Ambulatory Visit: Payer: Self-pay | Admitting: Neurosurgery

## 2017-08-06 DIAGNOSIS — M4150 Other secondary scoliosis, site unspecified: Secondary | ICD-10-CM

## 2017-08-08 ENCOUNTER — Encounter: Payer: Self-pay | Admitting: Family Medicine

## 2017-08-08 ENCOUNTER — Ambulatory Visit (INDEPENDENT_AMBULATORY_CARE_PROVIDER_SITE_OTHER): Payer: Medicare Other | Admitting: Family Medicine

## 2017-08-08 VITALS — BP 120/62 | Ht 63.0 in | Wt 189.0 lb

## 2017-08-08 DIAGNOSIS — M25511 Pain in right shoulder: Secondary | ICD-10-CM | POA: Diagnosis not present

## 2017-08-08 DIAGNOSIS — G8929 Other chronic pain: Secondary | ICD-10-CM

## 2017-08-08 DIAGNOSIS — M19011 Primary osteoarthritis, right shoulder: Secondary | ICD-10-CM | POA: Diagnosis not present

## 2017-08-08 MED ORDER — TRAMADOL HCL 50 MG PO TABS: 50.0000 mg | ORAL_TABLET | Freq: Four times a day (QID) | ORAL | 1 refills | Status: DC | PRN

## 2017-08-08 NOTE — Progress Notes (Signed)
      Date of Visit: 08/08/2017   HPI:  Right Shoulder Pain:  - here for follow up. Last seen in clinic on 07/11/17. At that time she got a subacromial bursa injection due to worsening shoulder pain after a fall. Prior to this, she was seen on 06/20/17. Ultrasound at that time showed severe degenerative arthritis with spurring on the right and small fluid around the biceps tendon. She received injection at the [facility] joint.  - reports she is about 30% better - she still has difficulty with sleeping at night due to the pain. Pain does get in the way with her ADLs.   - she does pendulum exercises about twice a day  - no numbness or tingling of the right upper extremities   ROS: See HPI.  [place]:  PMH: HTN GERD DM2 Hx of R Bicep Tendon tear  S/p lumbar fusion  PHYSICAL EXAM: BP 120/62   Ht 5\' 3"  (1.6 m)   Wt 189 lb (85.7 kg)   BMI 33.48 kg/m  Gen: NAD Right Shoulder: Inspection reveals no abnormalities, atrophy or asymmetry. Palpation: pain with palpation over AC joint and bicipital groove. ROM: forward flexion and abduction to about 150 degrees. Declined to do internal rotation.  Rotator cuff strength normal throughout. Signs of  impingement with negative Neer and Hawkin's tests, empty can. Has painful arc but no drop arm sign. No apprehension sign  ASSESSMENT/PLAN:  Arthritis of right acromioclavicular joint We discussed options for therapy including surgery, repeat AC joint injection vs referral to orthopedics vs oral pain medication. Patient does not want surgery, and she would like to try pain medication. Tramadol sent to pharmacy   Smiley Houseman, MD PGY [place]

## 2017-08-08 NOTE — Progress Notes (Signed)
[facility] Attending Note: I have seen and examined this patient. I have discussed this patient with the resident and reviewed the assessment and plan as documented above. I agree with the resident's findings and plan. She is able to do many of her ADLs secondary to shoulder pain.  Does not want to do surgery.  CSI did not significantly improve.  Will try oral pain medicine.  If she is able to improve her daily function, chronic pain medication may be her best bet follow-up 4 weeks Greater than 50% of our 25 minute office visit was spent in counseling and education regarding these issues.

## 2017-08-08 NOTE — Patient Instructions (Signed)
Let's try Tramadol as needed for pain  Follow up in 4 weeks

## 2017-08-10 ENCOUNTER — Ambulatory Visit
Admission: RE | Admit: 2017-08-10 | Discharge: 2017-08-10 | Disposition: A | Discharge status: Home / Self Care | Payer: Medicare Other | Source: Ambulatory Visit | Attending: Neurosurgery | Admitting: Neurosurgery

## 2017-08-10 DIAGNOSIS — M5127 Other intervertebral disc displacement, lumbosacral region: Secondary | ICD-10-CM | POA: Diagnosis not present

## 2017-08-10 DIAGNOSIS — M4807 Spinal stenosis, lumbosacral region: Secondary | ICD-10-CM | POA: Diagnosis not present

## 2017-08-10 DIAGNOSIS — M4150 Other secondary scoliosis, site unspecified: Secondary | ICD-10-CM

## 2017-08-10 MED ORDER — GADOBENATE DIMEGLUMINE 529 MG/ML IV SOLN
18.0000 mL | Freq: Once | INTRAVENOUS | Status: AC | PRN
  Administered 2017-08-10: 18 mL via INTRAVENOUS

## 2017-08-11 ENCOUNTER — Other Ambulatory Visit: Payer: Medicare Other

## 2017-08-13 ENCOUNTER — Ambulatory Visit
Admission: RE | Admit: 2017-08-13 | Discharge: 2017-08-13 | Disposition: A | Discharge status: Home / Self Care | Payer: Medicare Other | Source: Ambulatory Visit | Attending: Ophthalmology | Admitting: Ophthalmology

## 2017-08-13 DIAGNOSIS — H5010 Unspecified exotropia: Secondary | ICD-10-CM

## 2017-08-13 DIAGNOSIS — R531 Weakness: Secondary | ICD-10-CM | POA: Diagnosis not present

## 2017-08-13 MED ORDER — GADOBENATE DIMEGLUMINE 529 MG/ML IV SOLN
20.0000 mL | Freq: Once | INTRAVENOUS | Status: AC | PRN
  Administered 2017-08-13: 20 mL via INTRAVENOUS

## 2017-08-14 LAB — MYASTHENIA GRAVIS FULL PANEL
AChR Binding Ab, Serum: <0.03 nmol/L (ref 0.00–0.24)
ANTI-STRIATION ABS: NEGATIVE
Acetylchol Block Ab: 11 % (ref 0–25)

## 2017-08-15 ENCOUNTER — Telehealth: Payer: Self-pay | Admitting: Internal Medicine

## 2017-08-15 NOTE — Telephone Encounter (Signed)
Called patient to inform her of negative test results. No answer, left voicemail. Explained in voicemail that if patient would like copy of records sent to her ophthalmologist, please call to let us know, and I will be happy to do so. Also encouraged patient to call with any questions.   Adin Hector, MD, MPH PGY-3 [facility] Pager [phone]

## 2017-08-15 NOTE — Telephone Encounter (Signed)
Patient left message to please send test results to Dr Tama High at [facility]. Fax to [phone].  Danley Danker, RN [facility] RN)

## 2017-08-15 NOTE — Telephone Encounter (Signed)
Results faxed to Dr. Manuella Ghazi at number provided per patient's request.   Adin Hector, MD, MPH PGY-3 [facility] Pager [phone]

## 2017-08-16 ENCOUNTER — Other Ambulatory Visit: Payer: Self-pay | Admitting: Family Medicine

## 2017-08-16 DIAGNOSIS — K219 Gastro-esophageal reflux disease without esophagitis: Secondary | ICD-10-CM

## 2017-08-21 DIAGNOSIS — M4150 Other secondary scoliosis, site unspecified: Secondary | ICD-10-CM | POA: Diagnosis not present

## 2017-08-21 DIAGNOSIS — M5416 Radiculopathy, lumbar region: Secondary | ICD-10-CM | POA: Diagnosis not present

## 2017-08-27 DIAGNOSIS — H8000 Otosclerosis involving oval window, nonobliterative, unspecified ear: Secondary | ICD-10-CM | POA: Diagnosis not present

## 2017-08-29 ENCOUNTER — Encounter: Payer: Self-pay | Admitting: Family Medicine

## 2017-08-29 ENCOUNTER — Ambulatory Visit: Payer: Self-pay

## 2017-08-29 ENCOUNTER — Ambulatory Visit (INDEPENDENT_AMBULATORY_CARE_PROVIDER_SITE_OTHER): Payer: Medicare Other | Admitting: Family Medicine

## 2017-08-29 VITALS — BP 114/67 | Ht 63.0 in | Wt 189.0 lb

## 2017-08-29 DIAGNOSIS — M19011 Primary osteoarthritis, right shoulder: Secondary | ICD-10-CM | POA: Diagnosis not present

## 2017-08-29 MED ORDER — METHYLPREDNISOLONE ACETATE 40 MG/ML IJ SUSP
40.0000 mg | Freq: Once | INTRAMUSCULAR | Status: AC
  Administered 2017-08-29: 40 mg via INTRA_ARTICULAR

## 2017-08-29 NOTE — Patient Instructions (Signed)
Thank you for coming!  You got an [facility] joint injection  Please follow up in 4 weeks.

## 2017-08-29 NOTE — Progress Notes (Signed)
      Date of Visit: 08/29/2017   HPI:  Right Shoulder Pain: - last seen on 5/3 for Right AC joint arthritis pain. At that time, we discussed options for management: patient chose trial of PO Tramadol. Prior to this she had a subacrominal bursa injection injection in 4/5 and an AC joint injection on 3/15. She had some relief from both of these injections, but her recovery was complicated by a fall onto her right shoulder at that time - she reports Tramadol has not made much difference with her pain. She is still doing her exercises - pain is worse at night  - she has trouble using the right arm to do daily activities - she feels like the pain is now radiating to her lower bicep and elbow region    ROS: See HPI.  [place]:  PMH: HTN GERD DM2 Hx of R Bicep Tendon tear  S/p lumbar fusion   PHYSICAL EXAM: BP 114/67   Ht 5\' 3"  (1.6 m)   Wt 189 lb (85.7 kg)   BMI 33.48 kg/m  Gen: NAD Right Shoulder:  Inspection reveals no abnormalities, atrophy or asymmetry. Palpation: pain with palpation over AC joint ROM: forward flexion and abduction to about 160 degrees. Internal rotation limited (thumb to low lumbar level) compared to the right (thumb to upper lumbar level), normal external rotation.  Rotator cuff strength normal throughout. Signs of  impingement with negative Neer, Hawkin's tests, empty can. Has painful arc but no drop arm sign.  ASSESSMENT/PLAN:  Right AC Joint Arthritis:  Unfortunately, patient has not gotten much relief from the therapies we have tried thus far. She is interested in a repeat [facility] joint injection which we completed today (note below). She is not interested in surgery. She can continue PRN Tramadol if this helps some. Follow up in 4 weeks.   PROCEDURE: INJECTION: Patient was given informed consent, signed copy in the chart. Appropriate time out was taken. Area prepped and draped in usual sterile fashion. Ethyl chloride was  used for local anesthesia. A 21  gauge 1 1/2 inch needle was used.. 1 cc of methylprednisolone 40 mg/ml plus 1 cc of 1% lidocaine without epinephrine was injected into the right acromioclavicular joint using a(n) ultrasound-guided sterile approach.   The patient tolerated the procedure well. There were no complications. Post procedure instructions were given.  Smiley Houseman, MD PGY [place]

## 2017-08-30 ENCOUNTER — Other Ambulatory Visit: Payer: Self-pay | Admitting: Internal Medicine

## 2017-08-31 ENCOUNTER — Encounter: Payer: Self-pay | Admitting: Family Medicine

## 2017-08-31 NOTE — Progress Notes (Signed)
[facility] Attending Note: I have seen and examined this patient. I have discussed this patient with the resident and reviewed the assessment and plan as documented above. I agree with the resident's findings and plan.  

## 2017-09-25 DIAGNOSIS — M5416 Radiculopathy, lumbar region: Secondary | ICD-10-CM | POA: Diagnosis not present

## 2017-09-26 ENCOUNTER — Ambulatory Visit: Payer: Medicare Other | Admitting: Family Medicine

## 2017-10-17 ENCOUNTER — Other Ambulatory Visit: Payer: Self-pay

## 2017-10-17 ENCOUNTER — Ambulatory Visit (INDEPENDENT_AMBULATORY_CARE_PROVIDER_SITE_OTHER): Payer: Medicare Other | Admitting: Family Medicine

## 2017-10-17 VITALS — BP 132/70 | HR 77 | Temp 98.2°F | Ht 63.0 in

## 2017-10-17 DIAGNOSIS — N3000 Acute cystitis without hematuria: Secondary | ICD-10-CM | POA: Diagnosis not present

## 2017-10-17 DIAGNOSIS — R399 Unspecified symptoms and signs involving the genitourinary system: Secondary | ICD-10-CM | POA: Diagnosis not present

## 2017-10-17 LAB — POCT URINALYSIS DIP (MANUAL ENTRY)
Bilirubin, UA: NEGATIVE
Blood, UA: NEGATIVE
Glucose, UA: NEGATIVE mg/dL
Ketones, POC UA: NEGATIVE mg/dL
NITRITE UA: NEGATIVE
SPEC GRAV UA: 1.02 (ref 1.010–1.025)
UROBILINOGEN UA: 0.2 U/dL
pH, UA: 8 (ref 5.0–8.0)

## 2017-10-17 LAB — POCT UA - MICROSCOPIC ONLY

## 2017-10-17 MED ORDER — CEPHALEXIN 500 MG PO CAPS: 500.0000 mg | ORAL_CAPSULE | Freq: Two times a day (BID) | ORAL | 0 refills | Status: AC

## 2017-10-17 NOTE — Patient Instructions (Signed)

## 2017-10-17 NOTE — Progress Notes (Signed)
XTK:WIOX, Enis Slipper, MD Chief Complaint  Patient presents with  . Dysuria    burning with urination x 1 week    Current Issues:  Presents with 7 days of dysuria Associated symptoms include:  none  There is no history of of similar symptoms. Sexually active:  No   No concern for STI.  Prior to Admission medications   Medication Sig Start Date End Date Taking? Authorizing Provider  aspirin 81 MG tablet Take 1 tablet (81 mg total) by mouth daily. 09/30/11   Deneise Lever, MD  atorvastatin (LIPITOR) 40 MG tablet TAKE 1 TABLET (40 MG TOTAL) BY MOUTH DAILY. 05/20/17   Verner Mould, MD  baclofen (LIORESAL) 10 MG tablet TAKE 0.5 TABLETS (5 MG TOTAL) BY MOUTH 3 (THREE) TIMES DAILY AS NEEDED FOR MUSCLE SPASMS. 05/26/17   Verner Mould, MD  brimonidine-timolol (COMBIGAN) 0.2-0.5 % ophthalmic solution Place 1 drop into both eyes every 12 (twelve) hours. 1 drop each eye every 12 hours 09/30/11   Deneise Lever, MD  calcium carbonate (OS-CAL) 600 MG TABS Take 1 tablet (600 mg total) by mouth daily. 09/30/11   Deneise Lever, MD  cholecalciferol (VITAMIN D) 1000 UNITS tablet Take 1 tablet (1,000 Units total) by mouth daily. 10/14/13   Virginia Crews, MD  diclofenac sodium (VOLTAREN) 1 % GEL Apply 2 g topically 4 (four) times daily. 04/30/17   Verner Mould, MD  dorzolamide (TRUSOPT) 2 % ophthalmic solution Place 1 drop into the right eye 2 (two) times daily.    [provider]  gabapentin (NEURONTIN) 400 MG capsule TAKE TWO CAPSULES IN THE MORNING AND AT LUNCHTIME. TAKE THREE CAPSULES AT BEDTIME. 09/02/17   Carlyle Dolly, MD  hydrochlorothiazide (HYDRODIURIL) 25 MG tablet Take 1 tablet (25 mg total) by mouth daily. 05/20/17   Verner Mould, MD  latanoprost (XALATAN) 0.005 % ophthalmic solution Place 1 drop into both eyes at bedtime.    [provider]  metFORMIN (GLUCOPHAGE) 500 MG tablet TAKE 1 TABLET TWICE A DAY WITH A MEAL  09/17/16   Bacigalupo, Dionne Bucy, MD  Multiple Vitamin (MULTIVITAMIN WITH MINERALS) TABS tablet Take 1 tablet by mouth daily.    [provider]  omeprazole (PRILOSEC) 20 MG capsule TAKE ONE CAPSULE BY MOUTH TWICE A DAY 08/18/17   Verner Mould, MD  Oxycodone HCl 10 MG TABS Take 10 mg by mouth every 6 (six) hours as needed.    [provider]  ramipril (ALTACE) 10 MG capsule TAKE 1 CAPSULE (10 MG TOTAL) BY MOUTH DAILY. 05/20/17   Verner Mould, MD  sennosides-docusate sodium (SENOKOT-S) 8.6-50 MG tablet Take 2 tablets by mouth 2 (two) times daily.    [provider]  traMADol (ULTRAM) 50 MG tablet Take 1 tablet (50 mg total) by mouth every 6 (six) hours as needed. 08/08/17   Dickie La, MD    Review of Systems: Negative for fevers, chills, worsening abdominal pain, hematuria  PE:  BP 132/70   Pulse 77   Temp 98.2 F (36.8 C) (Oral)   Ht 5\' 3"  (1.6 m)   SpO2 98%   BMI 33.48 kg/m  Gen: NAD, resting comfortably CV: RRR with no murmurs appreciated Pulm: NWOB, CTAB with no crackles, wheezes, or rhonchi GI: Normal bowel sounds present. Soft, Nontender, Nondistended. MSK: no edema, cyanosis, or clubbing noted Skin: warm, dry Neuro: grossly normal, moves all extremities Psych: Normal affect and thought content  Results for orders placed or  performed in visit on 10/17/17  POCT urinalysis dipstick  Result Value Ref Range   Color, UA yellow yellow   Clarity, UA clear clear   Glucose, UA negative negative mg/dL   Bilirubin, UA negative negative   Ketones, POC UA negative negative mg/dL   Spec Grav, UA 1.020 1.010 - 1.025   Blood, UA negative negative   pH, UA 8.0 5.0 - 8.0   Protein Ur, POC trace (A) negative mg/dL   Urobilinogen, UA 0.2 0.2 or 1.0 E.U./dL   Nitrite, UA Negative Negative   Leukocytes, UA Moderate (2+) (A) Negative    Assessment and Plan:  1. UTI symptoms - POCT urinalysis dipstick - POCT UA - Microscopic Only - Ux  pending Diagnosis of simple cystitis.  Will treat with Keflex.  Patient return as needed.  Strict return precautions given.

## 2017-10-19 LAB — URINE CULTURE

## 2017-10-20 ENCOUNTER — Encounter: Payer: Self-pay | Admitting: Family Medicine

## 2017-10-20 ENCOUNTER — Ambulatory Visit (INDEPENDENT_AMBULATORY_CARE_PROVIDER_SITE_OTHER): Payer: Medicare Other | Admitting: Family Medicine

## 2017-10-20 DIAGNOSIS — I10 Essential (primary) hypertension: Secondary | ICD-10-CM | POA: Diagnosis not present

## 2017-10-20 DIAGNOSIS — E1142 Type 2 diabetes mellitus with diabetic polyneuropathy: Secondary | ICD-10-CM

## 2017-10-20 MED ORDER — RAMIPRIL 10 MG PO CAPS: ORAL_CAPSULE | ORAL | 3 refills | Status: DC

## 2017-10-20 MED ORDER — METFORMIN HCL 500 MG PO TABS: ORAL_TABLET | ORAL | 3 refills | Status: DC

## 2017-10-20 NOTE — Patient Instructions (Addendum)
It was very nice meeting you today.  You were seen in clinic for annual physical as well as medication refill.  I have sent in prescription refills for metformin and ramipril to your pharmacy and you can pick these up.  We have discussed possibly discontinuing your metformin as your A1c shows good sugar control.  I will recheck your A1c in about 2 to 3 months time and we can make that decision then.  Please call clinic if you have any questions.  Be well, [name] MD

## 2017-10-20 NOTE — Progress Notes (Signed)
   Subjective:   Patient ID: [name]    DOB: 28-Feb-1946, 72 y.o. female   MRN: [id number]  CC: physical, medication refill   HPI: [name] is a 72 y.o. female who presents to clinic today for physical and medication refill.   Hypertension Reports taking Enalapril and HCTZ with good compliance.  She has been watching her salt intake as instructed.  Diet  consists mainly of baked and broiled foods, avoids fried foods, does not drink sodas but sometimes ginger ale and green tea.  She denies headache, blurry vision, lightheadedness.  No other issues at this time and is here for a wellness check.   T2DM Well controlled. Currently taking Metformin, reports good compliance.  No current issues.   ROS: No fevers, chills, nausea, vomiting.  No dizziness, chest pain, or shortness of breath.   Social: she is a never smoker  Medications reviewed.  Objective:   BP 115/70 (BP Location: Left Arm, Patient Position: Sitting, Cuff Size: Large)   Pulse 72   Temp 98.1 F (36.7 C) (Oral)   Wt 184 lb 3.2 oz (83.6 kg)   SpO2 96%   BMI 32.63 kg/m  Vitals and nursing note reviewed.  General: 71 yo female, NAD HEENT: normocephalic, atraumatic, moist mucous membranes Neck: supple, non-tender without lymphadenopathy CV: regular rate and rhythm without murmurs rubs or gallops Lungs: clear to auscultation bilaterally with normal work of breathing Abdomen: soft, non-tender, no masses or organomegaly palpable, normoactive bowel sounds Skin: warm, dry, no rashes or lesions, cap refill < 2 seconds Extremities: warm and well perfused, normal tone  Assessment & Plan:    HTN (hypertension) Well controlled, 115/70, on current regimen of HCTZ and enalapril.  No medication changes today,  Encourage continue lifestyle modifications.  -refill enalapril -f/u 3 months   T2DM (type 2 diabetes mellitus) Well controlled, 6.1% at last check about 4 months ago.  Discussed possibly discontinuing  Metformin as A1c shows good sugar control.  Will recheck in about 2 to 3 months time and decide then.   Meds ordered this encounter  Medications  . ramipril (ALTACE) 10 MG capsule    Sig: TAKE 1 CAPSULE (10 MG TOTAL) BY MOUTH DAILY.    Dispense:  90 capsule    Refill:  3  . metFORMIN (GLUCOPHAGE) 500 MG tablet    Sig: TAKE 1 TABLET TWICE A DAY WITH A MEAL    Dispense:  180 tablet    Refill:  3    [name], MD [place], PGY-3

## 2017-10-22 ENCOUNTER — Encounter: Payer: Self-pay | Admitting: Family Medicine

## 2017-10-23 DIAGNOSIS — M5416 Radiculopathy, lumbar region: Secondary | ICD-10-CM | POA: Diagnosis not present

## 2017-10-24 ENCOUNTER — Encounter: Payer: Self-pay | Admitting: Family Medicine

## 2017-10-24 ENCOUNTER — Ambulatory Visit (INDEPENDENT_AMBULATORY_CARE_PROVIDER_SITE_OTHER): Payer: Medicare Other | Admitting: Family Medicine

## 2017-10-24 DIAGNOSIS — M19011 Primary osteoarthritis, right shoulder: Secondary | ICD-10-CM

## 2017-10-24 DIAGNOSIS — M19019 Primary osteoarthritis, unspecified shoulder: Secondary | ICD-10-CM | POA: Insufficient documentation

## 2017-10-24 DIAGNOSIS — E1140 Type 2 diabetes mellitus with diabetic neuropathy, unspecified: Secondary | ICD-10-CM | POA: Diagnosis not present

## 2017-10-24 MED ORDER — TRAMADOL HCL 50 MG PO TABS: 50.0000 mg | ORAL_TABLET | Freq: Four times a day (QID) | ORAL | 4 refills | Status: DC | PRN

## 2017-10-24 NOTE — Progress Notes (Signed)
  [name] - 72 y.o. female MRN [id number]  Date of birth: [birth year]    SUBJECTIVE:      Chief Complaint:/ HPI:  right shoulder pain Significantly improved at least 85% better. ROM is almost back to normal   ROS:    Pertinent review of systems: negative for fever or unusual weight change. No numbness RUE  PERTINENT  PMH / PSH FH / / SH:  Past Medical, Surgical, Social, and Family History Reviewed & Updated in the EMR.  Pertinent findings include:   OBJECTIVE: BP 120/64   Ht 5\' 3"  (1.6 m)   Wt 189 lb (85.7 kg)   BMI 33.48 kg/m   Physical Exam:  Vital signs are reviewed. FROM bilateral shoulders.Intact strength in all planes rotator cuff. Distally neurovascularly intact B UE  ASSESSMENT & PLAN:  Refilled tramadol I will see her back 5 -6 mmonths and in interim with new or worsening sx

## 2017-10-24 NOTE — Assessment & Plan Note (Signed)
Significant improvement now 7 weeks out from her second acromioclavicular joint injection.  She has much better range of motion.  Still some pain but that is managed with tramadol.  I refilled her tramadol today.  Follow-up 6 months, sooner with problems.

## 2017-10-24 NOTE — Patient Instructions (Signed)
RTC 6 m

## 2017-10-27 NOTE — Assessment & Plan Note (Signed)
Well controlled, 6.1% at last check about 4 months ago.  Discussed possibly discontinuing Metformin as A1c shows good sugar control.  Will recheck in about 2 to 3 months time and decide then.

## 2017-10-27 NOTE — Assessment & Plan Note (Addendum)
Well controlled, 115/70, on current regimen of HCTZ and enalapril.  No medication changes today,  Encourage continue lifestyle modifications.  -refill enalapril -f/u 3 months

## 2017-11-03 ENCOUNTER — Other Ambulatory Visit: Payer: Self-pay | Admitting: Internal Medicine

## 2017-11-24 ENCOUNTER — Other Ambulatory Visit: Payer: Self-pay | Admitting: Internal Medicine

## 2017-11-25 DIAGNOSIS — M5416 Radiculopathy, lumbar region: Secondary | ICD-10-CM | POA: Diagnosis not present

## 2017-11-25 DIAGNOSIS — M4150 Other secondary scoliosis, site unspecified: Secondary | ICD-10-CM | POA: Diagnosis not present

## 2017-11-25 DIAGNOSIS — I10 Essential (primary) hypertension: Secondary | ICD-10-CM | POA: Diagnosis not present

## 2017-12-01 ENCOUNTER — Telehealth: Payer: Self-pay | Admitting: Family Medicine

## 2017-12-01 NOTE — Telephone Encounter (Signed)
Placed in MDs box to be filled out. [name], CMA  

## 2017-12-01 NOTE — Telephone Encounter (Signed)
[employer] disability form dropped off for at front desk for completion.  Verified that patient section of form has been completed.  Last DOS/WCC with PCP was 10/20/17.  Placed form in team folder to be completed by clinical staff.  [name]

## 2017-12-11 NOTE — Telephone Encounter (Signed)
Patient calling to check status of forms she dropped off on Aug 26th.  Call back is [phone].  Danley Danker, RN [facility] RN)

## 2017-12-15 NOTE — Telephone Encounter (Signed)
I have placed in outgoing mailbox as she requested, she should be receiving them shortly.

## 2017-12-17 ENCOUNTER — Other Ambulatory Visit: Payer: Self-pay | Admitting: Family Medicine

## 2017-12-19 ENCOUNTER — Other Ambulatory Visit: Payer: Self-pay | Admitting: Internal Medicine

## 2017-12-31 ENCOUNTER — Ambulatory Visit (INDEPENDENT_AMBULATORY_CARE_PROVIDER_SITE_OTHER): Payer: Medicare Other | Admitting: Family Medicine

## 2017-12-31 ENCOUNTER — Encounter: Payer: Self-pay | Admitting: Family Medicine

## 2017-12-31 VITALS — BP 117/65 | HR 66 | Temp 98.6°F | Wt 187.6 lb

## 2017-12-31 DIAGNOSIS — E1142 Type 2 diabetes mellitus with diabetic polyneuropathy: Secondary | ICD-10-CM | POA: Diagnosis not present

## 2017-12-31 LAB — POCT GLYCOSYLATED HEMOGLOBIN (HGB A1C): HbA1c, POC (controlled diabetic range): 6 % (ref 0.0–7.0)

## 2017-12-31 NOTE — Patient Instructions (Signed)
It was nice seeing you again today. You were seen in clinic for follow-up of your diabetes.  We have checked your hemoglobin A1c today and it was 6.0.  As we discussed, you have good control of your blood sugars and we can discontinue your metformin at this time.  I would like you to follow-up in 3 months to recheck your A1c to make sure you continue to do well off medication.  In the meantime, please keep your appointment with the eye doctor for annual exam and make an appointment with your foot doctor for a annual diabetic foot exam.  These call clinic if you have any questions.  I will see you back in 3 months.  [name] MD

## 2017-12-31 NOTE — Progress Notes (Addendum)
   Subjective:   Patient ID: [name]    DOB: Aug 15, 1945, 72 y.o. female   MRN: [id number]  CC: f/u T2DM   HPI: [name] is a 72 y.o. female who presents to clinic today for the following issue.  T2DM Patient states she feels like she is doing well so far and controlling her blood sugars well.   A1c at last check 6.1 in 06/2017.  She checks blood sugars BID, once in the morning and once in the evening.  Morning fasting values range 100-102.  She denies hypoglycemia, lightheadedness.   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  No dizziness.   Social: pt is a never smoker.  Medications reviewed. Objective:   BP 117/65   Pulse 66   Temp 98.6 F (37 C)   Wt 187 lb 9.6 oz (85.1 kg)   SpO2 99%   BMI 33.23 kg/m  Vitals and nursing note reviewed.  General: 72 yo female, NAD  Neck: supple CV: RRR no MRG  Lungs: CTAB, normal effort  Abdomen: soft, NTND, +bs  Skin: warm, dry, no rash Extremities: warm and well perfused, normal tone Neuro: alert, oriented x3, no focal deficits   Assessment & Plan:   T2DM (type 2 diabetes mellitus) A1c stable today at 6.0.    Stop metformin at this time and monitor off med as she is well controlled.  She is agreeable to this plan.  Advised to keep appointment with eye doctor in November and set up appointment for foot exam with her podiatrist.  Encouraged to continue diet and physical exercise  Plan to recheck a1c in 3 months   Orders Placed This Encounter  Procedures  . HgB A1c   [name], MD [place], PGY-3 01/07/2018  4:23 PM

## 2018-01-02 NOTE — Telephone Encounter (Signed)
Pt states that Dr. Reesa Chew did not explain the answer on #5.  They sent the form back to her. She will bring it up here on Monday. [place], Salome Spotted, CMA

## 2018-01-05 ENCOUNTER — Telehealth: Payer: Self-pay | Admitting: Family Medicine

## 2018-01-05 NOTE — Telephone Encounter (Signed)
[employer] form dropped off for at front desk for completion.  (turned in once & not complete by healthcare professional per note)  Verified that patient section of form has been completed.  Last DOS/WCC with PCP was 10/20/17 .  Placed form in Red team folder to be completed by clinical staff.  [name]

## 2018-01-05 NOTE — Telephone Encounter (Signed)
Placed [employer] transportation form in PCP's box for completion.  Part B question # 5 needs to be explained.  [name], [place]

## 2018-01-07 NOTE — Assessment & Plan Note (Addendum)
A1c stable today at 6.0.    Stop metformin at this time and monitor off med as she is well controlled.  She is agreeable to this plan.  Advised to keep appointment with eye doctor in November and set up appointment for foot exam with her podiatrist.  Encouraged to continue diet and physical exercise  Plan to recheck a1c in 3 months

## 2018-01-08 NOTE — Telephone Encounter (Signed)
Paperwork for [employer] ([employer]) was completed by Dr. Reesa Chew and faxed to [facility]  Attention Almedia Balls at [phone].    A copy was also mailed to patient's address on file.  Called patient and informed her.  [name], [place]

## 2018-01-08 NOTE — Telephone Encounter (Signed)
I have filled in the missing information for Question #5 and placed her paperwork in outgoing mail box. Please inform her she should be receiving it soon.

## 2018-01-26 DIAGNOSIS — M5416 Radiculopathy, lumbar region: Secondary | ICD-10-CM | POA: Diagnosis not present

## 2018-02-10 IMAGING — RF DG LUMBAR SPINE 2-3V
1 series · 8 of 8 positions shown · non-contrast
Comparison: MRI lumbar spine 09/11/2015.

CLINICAL DATA: Intraoperative imaging for patient undergoing lumbar
fusion.

EXAM:
LUMBAR SPINE - 2-3 VIEW; DG C-ARM 61-120 MIN

[Series 1: run · 8 of 8 slices shown]
[im 1/8]
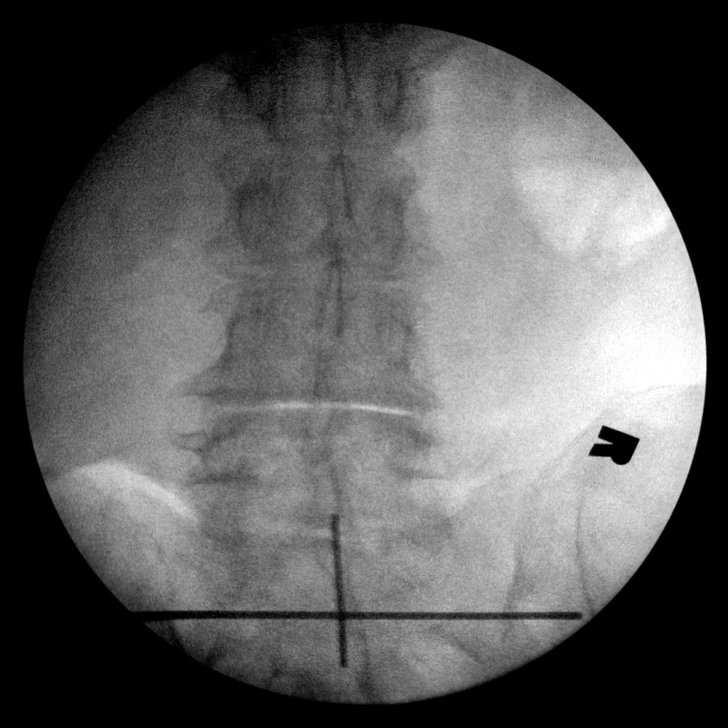
[im 2/8]
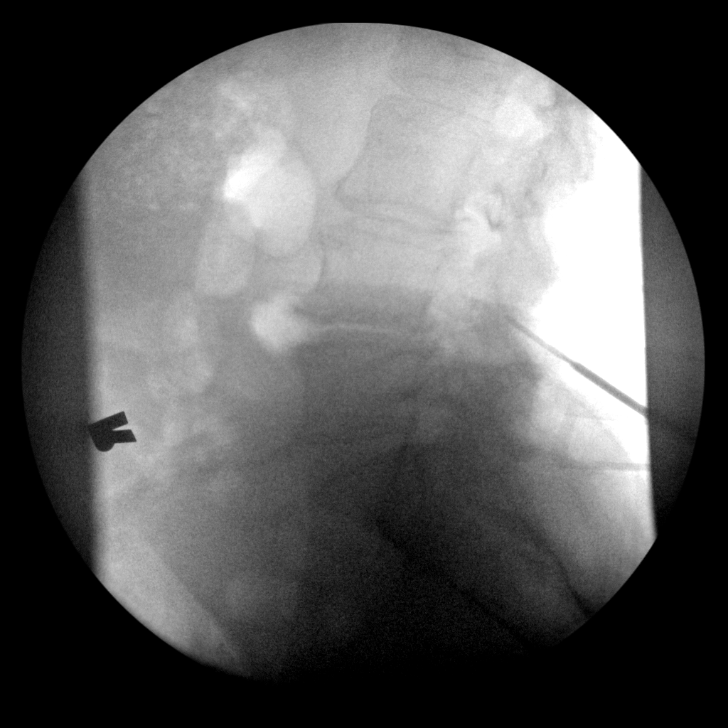
[im 3/8]
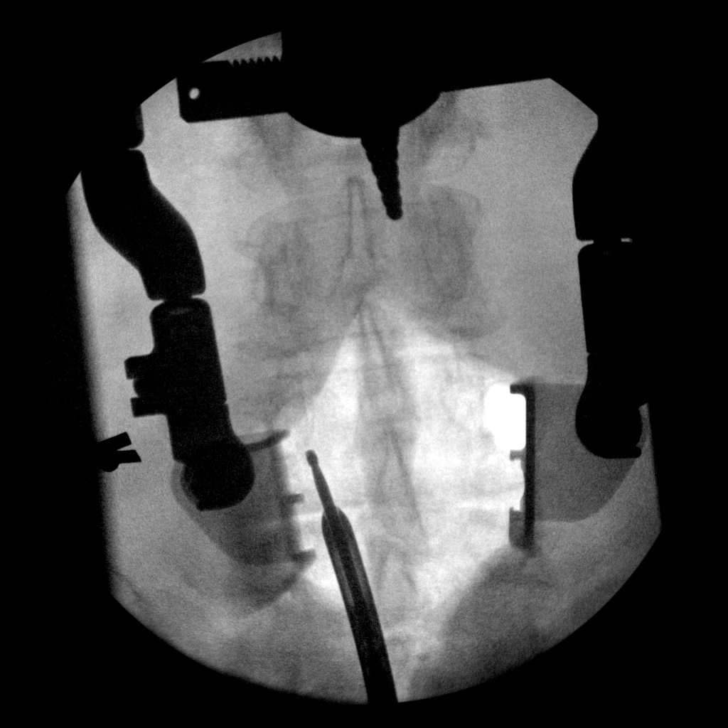
[im 4/8]
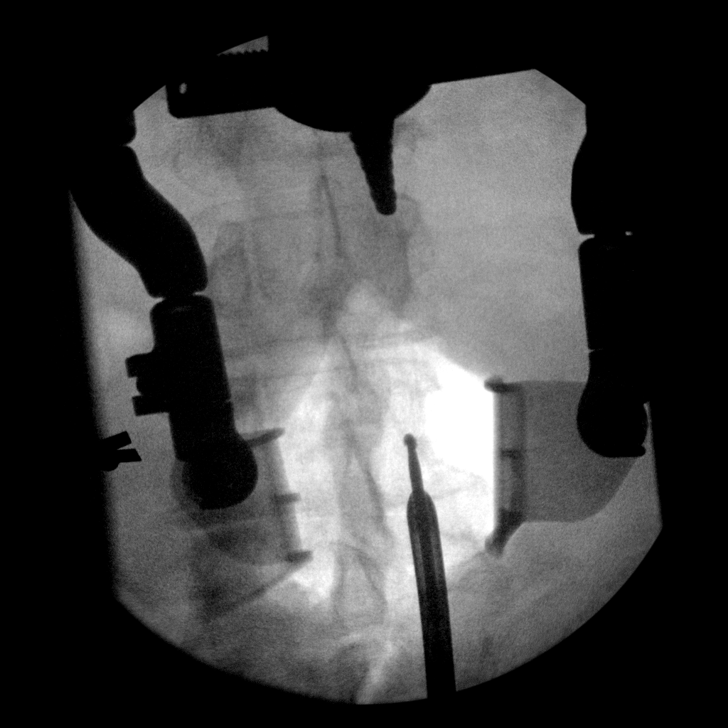
[im 5/8]
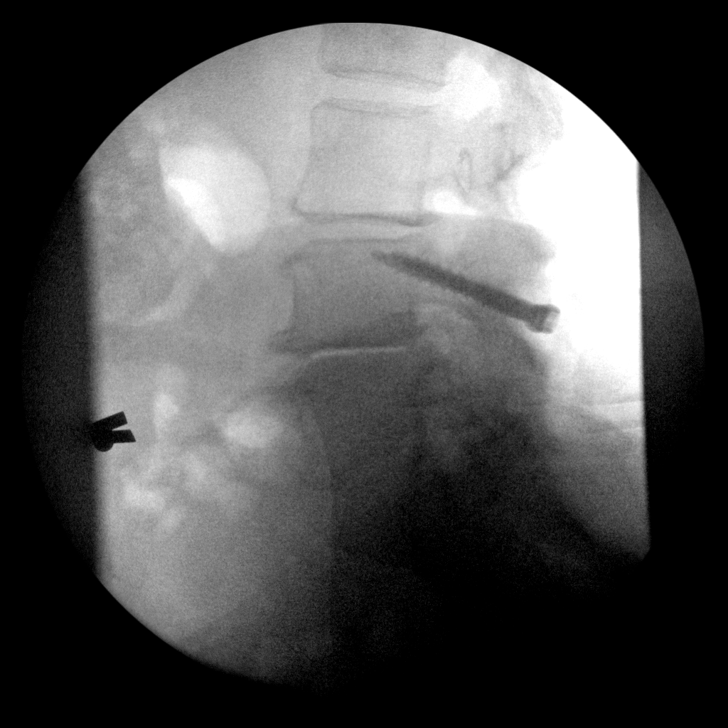
[im 6/8]
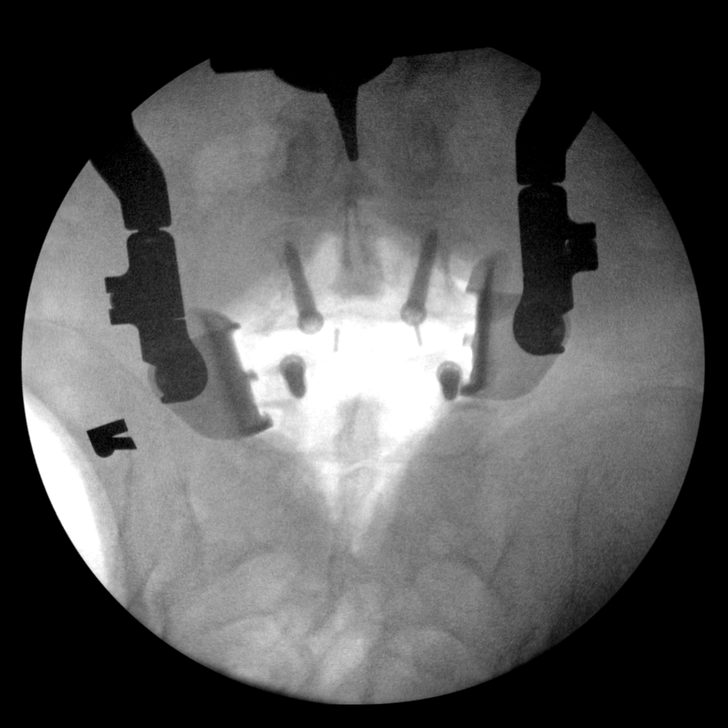
[im 7/8]
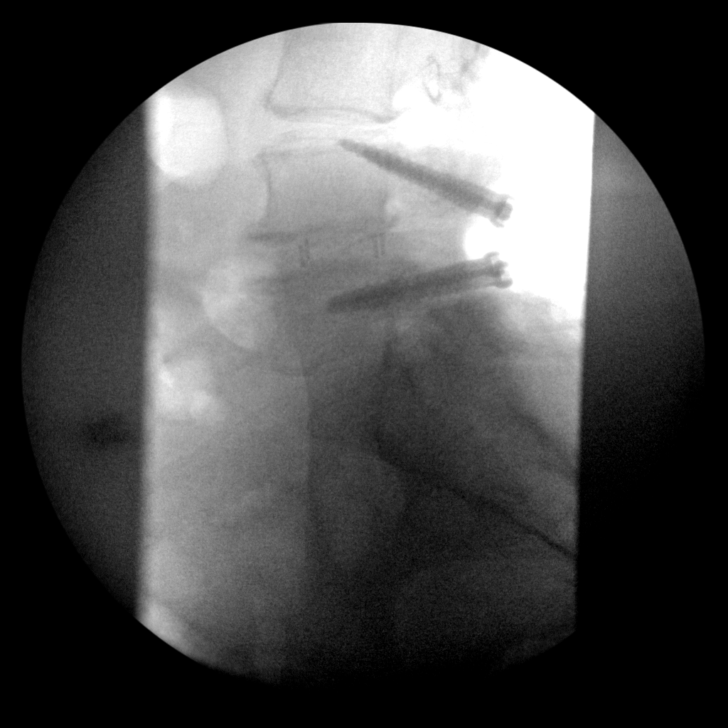
[im 8/8]
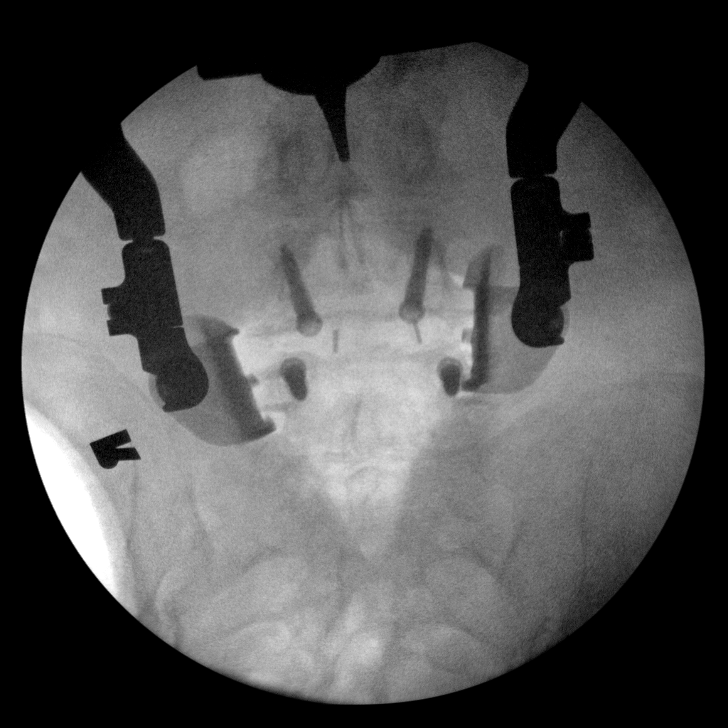

[8 of 8 positions shown; findings below may reference images not displayed]

FINDINGS: We are provided with 8 fluoroscopic spot views of the lumbar spine.
Images demonstrate placement of interbody spacer and pedicle screws
at L4-5. No acute abnormality is identified.
IMPRESSION: L4-5 discectomy and fusion in progress.

## 2018-02-18 DIAGNOSIS — M21961 Unspecified acquired deformity of right lower leg: Secondary | ICD-10-CM | POA: Diagnosis not present

## 2018-02-18 DIAGNOSIS — G5762 Lesion of plantar nerve, left lower limb: Secondary | ICD-10-CM | POA: Diagnosis not present

## 2018-02-18 DIAGNOSIS — M79672 Pain in left foot: Secondary | ICD-10-CM | POA: Diagnosis not present

## 2018-02-18 DIAGNOSIS — M21962 Unspecified acquired deformity of left lower leg: Secondary | ICD-10-CM | POA: Diagnosis not present

## 2018-02-18 DIAGNOSIS — E1351 Other specified diabetes mellitus with diabetic peripheral angiopathy without gangrene: Secondary | ICD-10-CM | POA: Diagnosis not present

## 2018-02-27 DIAGNOSIS — H401121 Primary open-angle glaucoma, left eye, mild stage: Secondary | ICD-10-CM | POA: Diagnosis not present

## 2018-03-02 ENCOUNTER — Other Ambulatory Visit: Payer: Self-pay | Admitting: Family Medicine

## 2018-03-02 DIAGNOSIS — M4150 Other secondary scoliosis, site unspecified: Secondary | ICD-10-CM | POA: Diagnosis not present

## 2018-03-02 DIAGNOSIS — M5416 Radiculopathy, lumbar region: Secondary | ICD-10-CM | POA: Diagnosis not present

## 2018-03-02 DIAGNOSIS — I10 Essential (primary) hypertension: Secondary | ICD-10-CM | POA: Diagnosis not present

## 2018-03-26 ENCOUNTER — Ambulatory Visit: Payer: Medicare Other | Admitting: Family Medicine

## 2018-03-27 DIAGNOSIS — E1351 Other specified diabetes mellitus with diabetic peripheral angiopathy without gangrene: Secondary | ICD-10-CM | POA: Diagnosis not present

## 2018-03-27 DIAGNOSIS — M79671 Pain in right foot: Secondary | ICD-10-CM | POA: Diagnosis not present

## 2018-03-27 DIAGNOSIS — G5762 Lesion of plantar nerve, left lower limb: Secondary | ICD-10-CM | POA: Diagnosis not present

## 2018-03-27 DIAGNOSIS — G5761 Lesion of plantar nerve, right lower limb: Secondary | ICD-10-CM | POA: Diagnosis not present

## 2018-03-27 DIAGNOSIS — M79672 Pain in left foot: Secondary | ICD-10-CM | POA: Diagnosis not present

## 2018-04-02 ENCOUNTER — Other Ambulatory Visit: Payer: Self-pay

## 2018-04-02 ENCOUNTER — Encounter: Payer: Self-pay | Admitting: Family Medicine

## 2018-04-02 ENCOUNTER — Ambulatory Visit (INDEPENDENT_AMBULATORY_CARE_PROVIDER_SITE_OTHER): Payer: Medicare Other | Admitting: Family Medicine

## 2018-04-02 VITALS — BP 112/68 | HR 67 | Temp 98.6°F | Ht 63.0 in | Wt 186.2 lb

## 2018-04-02 DIAGNOSIS — M25551 Pain in right hip: Secondary | ICD-10-CM | POA: Diagnosis not present

## 2018-04-02 DIAGNOSIS — M25552 Pain in left hip: Secondary | ICD-10-CM | POA: Diagnosis not present

## 2018-04-02 DIAGNOSIS — E1142 Type 2 diabetes mellitus with diabetic polyneuropathy: Secondary | ICD-10-CM | POA: Diagnosis not present

## 2018-04-02 LAB — POCT GLYCOSYLATED HEMOGLOBIN (HGB A1C): HbA1c, POC (controlled diabetic range): 6.4 % (ref 0.0–7.0)

## 2018-04-02 NOTE — Progress Notes (Signed)
   Subjective:   Patient ID: [name]    DOB: 01/17/1946, 72 y.o. female   MRN: [id number]  CC: R hip pain   HPI: [name] is a 72 y.o. female who presents to clinic today for the following issue.  R hip pain  Started a few weeks ago, started hurting on it's own, progressive in nature.  No history of fall or trauma/injury.  Pain does not radiate and is sharp in quality.  At its worst is 8/10 sometimes.  Has been taking pain medications and using heating pad to ease the pain however it has not helped much.  She has tried hydrocodone that she gets from her pain management clinic however this has also not helped.  Pain interferes with daily activities and is worse when waking up from bed.  Ambulates with her cane, she has a walker which she does not use as much.  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  Previous to this she has not had any issues with this hip.     ROS: See HPI for pertinent ROS.  Social: pt is a never smoker Medications reviewed. Objective:   BP 112/68   Pulse 67   Temp 98.6 F (37 C) (Oral)   Ht 5\' 3"  (1.6 m)   Wt 186 lb 3.2 oz (84.5 kg)   SpO2 99%   BMI 32.98 kg/m  Vitals and nursing note reviewed.  General: 72 yo female, NAD Neck: supple  CV: RRR no MRG  Lungs: CTAB, normal effort  Skin: warm, dry  Extremities: warm and well perfused MSK: R hip- ROM limited 2/2 pain.  Strength with flexion/extension/adduction and abduction 1/5 compared to 4/5 in left hip.   Pelvic alignment unremarkable to inspection and palpation. Greater trochanter without tenderness to palpation. No tenderness over piriformis and greater trochanter. No SI joint tenderness and normal minimal SI movement. Neuro: alert, oriented x3, sensation intact.   Assessment & Plan:   Right hip pain No suspicion for acute fracture given no h/o fall or trauma.  Will  obtain plain films of R hip to evaluate.  Suspect some component of osteoarthritis.  Advised to continue topical icy-hot and heating pads. Informed her that this may worsen with colder weather.   Pt to refill hydrocodone with her pain management clinic.  Follow up 6 weeks or sooner if needed  Orders Placed This Encounter  Procedures  . XR HIP UNILAT W OR W/O PELVIS 2-3 VIEWS RIGHT    Order Specific Question:   Reason for Exam (SYMPTOM  OR DIAGNOSIS REQUIRED)    Answer:   R hip pain    Order Specific Question:   Preferred imaging location?    Answer:   [facility]  . HgB A1c   Elizabeth Kim, MD [place]

## 2018-04-02 NOTE — Patient Instructions (Addendum)
It was nice seeing you again today.  You were seen in clinic for right hip pain that has been worsening over the last few weeks.  As we discussed, I suspect most of this is due to osteoarthritis however I will order some x-rays for further evaluation.  Please go to the [facility] radiology department on the first floor to have this done.  I will call you once the results have returned.   We also checked your A1c today which is 6.4 and indicates good control.  I have not made any changes to your medications at this time.  You may call clinic if you have any questions.    [name] MD

## 2018-04-03 ENCOUNTER — Ambulatory Visit (HOSPITAL_COMMUNITY)
Admission: RE | Admit: 2018-04-03 | Discharge: 2018-04-03 | Disposition: A | Discharge status: Home / Self Care | Payer: Medicare Other | Source: Ambulatory Visit | Attending: Family Medicine | Admitting: Family Medicine

## 2018-04-03 ENCOUNTER — Other Ambulatory Visit (HOSPITAL_COMMUNITY): Payer: Self-pay | Admitting: Family Medicine

## 2018-04-03 DIAGNOSIS — M25551 Pain in right hip: Secondary | ICD-10-CM

## 2018-04-09 DIAGNOSIS — M25551 Pain in right hip: Secondary | ICD-10-CM | POA: Insufficient documentation

## 2018-04-09 NOTE — Assessment & Plan Note (Signed)
No suspicion for acute fracture given no h/o fall or trauma.  Will obtain plain films of R hip to evaluate.  Suspect some component of osteoarthritis.  Advised to continue topical icy-hot and heating pads. Informed her that this may worsen with colder weather.   Pt to refill hydrocodone with her pain management clinic.  Follow up 6 weeks or sooner if needed

## 2018-04-22 ENCOUNTER — Other Ambulatory Visit: Payer: Self-pay | Admitting: Family Medicine

## 2018-04-22 DIAGNOSIS — Z1231 Encounter for screening mammogram for malignant neoplasm of breast: Secondary | ICD-10-CM | POA: Diagnosis not present

## 2018-04-29 DIAGNOSIS — G5761 Lesion of plantar nerve, right lower limb: Secondary | ICD-10-CM | POA: Diagnosis not present

## 2018-04-29 DIAGNOSIS — G5762 Lesion of plantar nerve, left lower limb: Secondary | ICD-10-CM | POA: Diagnosis not present

## 2018-04-29 DIAGNOSIS — M21962 Unspecified acquired deformity of left lower leg: Secondary | ICD-10-CM | POA: Diagnosis not present

## 2018-04-29 DIAGNOSIS — M21961 Unspecified acquired deformity of right lower leg: Secondary | ICD-10-CM | POA: Diagnosis not present

## 2018-05-19 ENCOUNTER — Other Ambulatory Visit: Payer: Self-pay | Admitting: *Deleted

## 2018-05-19 DIAGNOSIS — I10 Essential (primary) hypertension: Secondary | ICD-10-CM

## 2018-05-19 MED ORDER — ATORVASTATIN CALCIUM 40 MG PO TABS: ORAL_TABLET | ORAL | 3 refills | Status: DC

## 2018-05-19 MED ORDER — HYDROCHLOROTHIAZIDE 25 MG PO TABS: 25.0000 mg | ORAL_TABLET | Freq: Every day | ORAL | 3 refills | Status: DC

## 2018-05-22 ENCOUNTER — Ambulatory Visit (INDEPENDENT_AMBULATORY_CARE_PROVIDER_SITE_OTHER): Payer: Medicare Other | Admitting: Family Medicine

## 2018-05-22 VITALS — BP 120/70 | HR 75 | Temp 97.6°F | Wt 188.6 lb

## 2018-05-22 DIAGNOSIS — M25551 Pain in right hip: Secondary | ICD-10-CM | POA: Diagnosis not present

## 2018-05-22 DIAGNOSIS — Z0289 Encounter for other administrative examinations: Secondary | ICD-10-CM | POA: Diagnosis not present

## 2018-05-22 NOTE — Assessment & Plan Note (Signed)
Stable, discussed results of x-ray imaging-showed postoperative changes in her lower lumbar spine from previous surgery.  No fracture or dislocation. Continue to follow with pain management  Handicap placard filled out and returned to patient  Return precautions reviewed

## 2018-05-22 NOTE — Progress Notes (Signed)
   Subjective:   Patient ID: Gaetano Hawthorne    DOB: 03/31/1946, 73 y.o. female   MRN: [id number]  CC: discuss x-ray results, handicap placard  HPI: [name] is a 73 y.o. female who presents to clinic today for the following issues.  R hip pain Patient presents today to discuss the x-rays from her right hip on 12/27.  She is currently followed by pain management.  Does not have any new acute or worsening symptoms.  She has been taking hydrocodone as prescribed by them, and using heating pads which she feels are helping.   She also brings a handicap placard form that she needs filled out today to assist with parking spots.   ROS: No fever, chills, nausea, vomiting.  No CP, SOB, numbness or tingling.  Social: pt is a never smoker Medications reviewed. Objective:   BP 120/70   Pulse 75   Temp 97.6 F (36.4 C) (Oral)   Wt 188 lb 9.6 oz (85.5 kg)   SpO2 97%   BMI 33.41 kg/m  Vitals and nursing note reviewed.  General: 73 year old female, NAD Neck: supple CV: RRR no MRG  Lungs: CTAB, normal effort  Abdomen: soft, NTND, +bs  Skin: warm, dry, no rash  Extremities: warm and well perfused Neuro: alert, no focal deficits  Assessment & Plan:   Right hip pain Stable, discussed results of x-ray imaging-showed postoperative changes in her lower lumbar spine from previous surgery.  No fracture or dislocation. Continue to follow with pain management  Handicap placard filled out and returned to patient  Return precautions reviewed  [name], MD [place] PGY-3

## 2018-05-27 DIAGNOSIS — G5762 Lesion of plantar nerve, left lower limb: Secondary | ICD-10-CM | POA: Diagnosis not present

## 2018-05-27 DIAGNOSIS — M21961 Unspecified acquired deformity of right lower leg: Secondary | ICD-10-CM | POA: Diagnosis not present

## 2018-05-27 DIAGNOSIS — M21962 Unspecified acquired deformity of left lower leg: Secondary | ICD-10-CM | POA: Diagnosis not present

## 2018-05-27 DIAGNOSIS — G5761 Lesion of plantar nerve, right lower limb: Secondary | ICD-10-CM | POA: Diagnosis not present

## 2018-06-08 DIAGNOSIS — M5416 Radiculopathy, lumbar region: Secondary | ICD-10-CM | POA: Diagnosis not present

## 2018-06-17 ENCOUNTER — Other Ambulatory Visit: Payer: Self-pay | Admitting: Family Medicine

## 2018-06-18 ENCOUNTER — Encounter: Payer: Self-pay | Admitting: Family Medicine

## 2018-06-22 ENCOUNTER — Other Ambulatory Visit: Payer: Self-pay | Admitting: Family Medicine

## 2018-06-22 DIAGNOSIS — K219 Gastro-esophageal reflux disease without esophagitis: Secondary | ICD-10-CM

## 2018-07-08 DIAGNOSIS — M5416 Radiculopathy, lumbar region: Secondary | ICD-10-CM | POA: Diagnosis not present

## 2018-07-08 DIAGNOSIS — M4150 Other secondary scoliosis, site unspecified: Secondary | ICD-10-CM | POA: Diagnosis not present

## 2018-08-26 ENCOUNTER — Other Ambulatory Visit: Payer: Self-pay

## 2018-08-26 ENCOUNTER — Ambulatory Visit (INDEPENDENT_AMBULATORY_CARE_PROVIDER_SITE_OTHER): Payer: Medicare Other | Admitting: Family Medicine

## 2018-08-26 ENCOUNTER — Encounter: Payer: Self-pay | Admitting: Family Medicine

## 2018-08-26 DIAGNOSIS — Z0000 Encounter for general adult medical examination without abnormal findings: Secondary | ICD-10-CM

## 2018-08-26 NOTE — Patient Instructions (Addendum)
You spoke to [name], MD over the phone for your annual wellness visit.  We discussed goals: Goals    . Consume 3-4 vegetables a day (pt-stated)    . Exercise 1x per week (30 min per time)       We also discussed recommended health maintenance. Please call our office and schedule a visit. As discussed, you are due for: Health Maintenance  Topic Date Due  . FOOT EXAM  07/11/2017  . OPHTHALMOLOGY EXAM  09/16/2017  . COLONOSCOPY  08/19/2018  . HEMOGLOBIN A1C  10/02/2018  . INFLUENZA VACCINE  11/07/2018  . TETANUS/TDAP  03/01/2019  . MAMMOGRAM  04/22/2020  . DEXA SCAN  Completed  . Hepatitis C Screening  Completed  . PNA vac Low Risk Adult  Completed   Please keep your scheduled visit for colonoscopy next month and I will follow up with your results.  At your next in-person visit, we will check your hemoglobin A1c to see how well your diabetes is controlled.  You will also need a hepatitis C screen to complete your health maintenance portion.    We also discussed your goals of including more vegetables in your diet and increasing physical activity to improve your health.  Please see the information below regarding fall prevention which may be helpful to you.    Our clinic's number is [phone]. Please call with questions or concerns about what we discussed today.    Fall Prevention in the Home, Adult Falls can cause injuries. They can happen to people of all ages. There are many things you can do to make your home safe and to help prevent falls. Ask for help when making these changes, if needed. What actions can I take to prevent falls? General Instructions  Use good lighting in all rooms. Replace any light bulbs that burn out.  Turn on the lights when you go into a dark area. Use night-lights.  Keep items that you use often in easy-to-reach places. Lower the shelves around your home if necessary.  Set up your furniture so you have a clear path. Avoid moving your  furniture around.  Do not have throw rugs and other things on the floor that can make you trip.  Avoid walking on wet floors.  If any of your floors are uneven, fix them.  Add color or contrast paint or tape to clearly mark and help you see: ? Any grab bars or handrails. ? First and last steps of stairways. ? Where the edge of each step is.  If you use a stepladder: ? Make sure that it is fully opened. Do not climb a closed stepladder. ? Make sure that both sides of the stepladder are locked into place. ? Ask someone to hold the stepladder for you while you use it.  If there are any pets around you, be aware of where they are. What can I do in the bathroom?      Keep the floor dry. Clean up any water that spills onto the floor as soon as it happens.  Remove soap buildup in the tub or shower regularly.  Use non-skid mats or decals on the floor of the tub or shower.  Attach bath mats securely with double-sided, non-slip rug tape.  If you need to sit down in the shower, use a plastic, non-slip stool.  Install grab bars by the toilet and in the tub and shower. Do not use towel bars as grab bars. What can I do in the  bedroom?  Make sure that you have a light by your bed that is easy to reach.  Do not use any sheets or blankets that are too big for your bed. They should not hang down onto the floor.  Have a firm chair that has side arms. You can use this for support while you get dressed. What can I do in the kitchen?  Clean up any spills right away.  If you need to reach something above you, use a strong step stool that has a grab bar.  Keep electrical cords out of the way.  Do not use floor polish or wax that makes floors slippery. If you must use wax, use non-skid floor wax. What can I do with my stairs?  Do not leave any items on the stairs.  Make sure that you have a light switch at the top of the stairs and the bottom of the stairs. If you do not have them, ask  someone to add them for you.  Make sure that there are handrails on both sides of the stairs, and use them. Fix handrails that are broken or loose. Make sure that handrails are as long as the stairways.  Install non-slip stair treads on all stairs in your home.  Avoid having throw rugs at the top or bottom of the stairs. If you do have throw rugs, attach them to the floor with carpet tape.  Choose a carpet that does not hide the edge of the steps on the stairway.  Check any carpeting to make sure that it is firmly attached to the stairs. Fix any carpet that is loose or worn. What can I do on the outside of my home?  Use bright outdoor lighting.  Regularly fix the edges of walkways and driveways and fix any cracks.  Remove anything that might make you trip as you walk through a door, such as a raised step or threshold.  Trim any bushes or trees on the path to your home.  Regularly check to see if handrails are loose or broken. Make sure that both sides of any steps have handrails.  Install guardrails along the edges of any raised decks and porches.  Clear walking paths of anything that might make someone trip, such as tools or rocks.  Have any leaves, snow, or ice cleared regularly.  Use sand or salt on walking paths during winter.  Clean up any spills in your garage right away. This includes grease or oil spills. What other actions can I take?  Wear shoes that: ? Have a low heel. Do not wear high heels. ? Have rubber bottoms. ? Are comfortable and fit you well. ? Are closed at the toe. Do not wear open-toe sandals.  Use tools that help you move around (mobility aids) if they are needed. These include: ? Canes. ? Walkers. ? Scooters. ? Crutches.  Review your medicines with your doctor. Some medicines can make you feel dizzy. This can increase your chance of falling. Ask your doctor what other things you can do to help prevent falls. Where to find more  information  Centers for Disease Control and Prevention, STEADI: https://garcia.biz/  [employer] on Aging: BrainJudge.co.uk Contact a doctor if:  You are afraid of falling at home.  You feel weak, drowsy, or dizzy at home.  You fall at home. Summary  There are many simple things that you can do to make your home safe and to help prevent falls.  Ways to make your  home safe include removing tripping hazards and installing grab bars in the bathroom.  Ask for help when making these changes in your home. This information is not intended to replace advice given to you by your health care provider. Make sure you discuss any questions you have with your health care provider. Document Released: 01/19/2009 Document Revised: 11/07/2016 Document Reviewed: 11/07/2016 Elsevier Interactive Patient Education  2019 [employer].

## 2018-08-26 NOTE — Progress Notes (Addendum)
Subjective:   [name] is a 73 y.o. female who presents for Medicare Annual (Subsequent) preventive examination.  The patient consented to a virtual visit.  Review of Systems:  ROS: No fevers, chills, nausea, vomiting.  No dizziness, chest pain, or shortness of breath.   Cardiac Risk Factors include: advanced age (>36men, >28 women);diabetes mellitus;dyslipidemia;hypertension    Objective:     Vitals: There were no vitals taken for this visit.  There is no height or weight on file to calculate BMI.  Advanced Directives 04/02/2018 10/20/2017 10/17/2017 08/04/2017 03/28/2017 01/23/2017 12/23/2016  Does Patient Have a Medical Advance Directive? No No No No No No No  Type of Advance Directive - - - - - - -  Copy of Healthcare Power of Attorney in Chart? - - - - - - -  Would patient like information on creating a medical advance directive? No - Patient declined No - Patient declined No - Patient declined No - Patient declined No - Patient declined No - Patient declined No - Patient declined    Tobacco Social History   Tobacco Use  Smoking Status Never Smoker  Smokeless Tobacco Never Used     Counseling given: Not Answered   Clinical Intake:  Pre-visit preparation completed: Yes  Pain : 0-10 Pain Score: 3  Pain Location: Back        How often do you need to have someone help you when you read instructions, pamphlets, or other written materials from your doctor or pharmacy?: 1 - Never What is the last grade level you completed in school?: 11  Interpreter Needed?: No     Past Medical History:  Diagnosis Date  . Arthritis    osteoarthritis  . Carpal tunnel syndrome on both sides    Followed by Dr. Burney Gauze  . Cataracts, bilateral    immature  . Chronic back pain    stenosis  . DDD (degenerative disc disease)    Lumbar Spondylosis  . Diabetes mellitus    takes Metformin daily  . GERD (gastroesophageal reflux disease)    takes Omeprazole daily  .  Glaucoma   . Hyperlipidemia    takes Atorvastatin daily  . Hypertension    takes Ramipril daily as well as HCTZ  . Muscle spasm    takes Baclofen as needed  . 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 Peripheral neuropathy    takes Gabapentin daily  . 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 Vitamin D deficiency    takes Vit D daily   Past Surgical History:  Procedure Laterality Date  . ABDOMINAL HYSTERECTOMY  1980   partial  . BREAST BIOPSY Bilateral 2017,2018   both benign  . CHOLECYSTECTOMY    . COLONOSCOPY    . HAND SURGERY     Right wrist, s/p plate removal in Feb 2012  . JOINT REPLACEMENT  2006   left knee  . left hand surgery    . SPINE SURGERY  2018   Family History  Problem Relation Age of Onset  . Heart disease Father   . Kidney disease Father   . Heart disease Mother   . Heart disease Sister   . Hyperlipidemia Sister   . Hypertension Sister   . Kidney disease Sister   . Heart failure Sister   . Kidney disease Brother    Social History   Socioeconomic History  . Marital status: Single    Spouse name:  Not on file  . Number of children: 2  . Years of education: 72  . Highest education level: 11th grade  Occupational History  . Occupation: Retired- Microbiologist: DISABLED  Social Needs  . Financial resource strain: Not very hard  . Food insecurity:    Worry: Patient refused    Inability: Patient refused  . Transportation needs:    Medical: No    Non-medical: No  Tobacco Use  . Smoking status: Never Smoker  . Smokeless tobacco: Never Used  Substance and Sexual Activity  . Alcohol use: Never    Frequency: Never  . Drug use: No  . Sexual activity: Not Currently    Birth control/protection: Surgical  Lifestyle  . Physical activity:    Days per week: Not on file    Minutes per session: Not on file  . Stress: Not on file  Relationships  . Social connections:    Talks on phone: Not on file     Gets together: Not on file    Attends religious service: Not on file    Active member of club or organization: Not on file    Attends meetings of clubs or organizations: Not on file    Relationship status: Not on file  Other Topics Concern  . Not on file  Social History Narrative   Son [place] POA:    Emergency Contact: daughter Zion Lint  [phone], [phone]   End of Life Plan: gave patient ad pamphlet   Who lives with you: self   Any pets: none   Diet: Pt has a varied diet   Exercise: Pt does not have a regular exercise routine.   Seatbelts: Pt reports wearing seatbelt when in vehicle.   Sun Exposure/Protection: Pt denies using sun protection.   Hobbies: singing in church chior, bible study             Outpatient Encounter Medications as of 08/26/2018  Medication Sig  . aspirin 81 MG tablet Take 1 tablet (81 mg total) by mouth daily.  Marland Kitchen atorvastatin (LIPITOR) 40 MG tablet TAKE 1 TABLET (40 MG TOTAL) BY MOUTH DAILY.  . baclofen (LIORESAL) 10 MG tablet TAKE 1/2 TABLET 3 TIMES A DAY AS NEEDED FOR MUSCLE SPASM  . brimonidine-timolol (COMBIGAN) 0.2-0.5 % ophthalmic solution Place 1 drop into both eyes every 12 (twelve) hours. 1 drop each eye every 12 hours  . calcium carbonate (OS-CAL) 600 MG TABS Take 1 tablet (600 mg total) by mouth daily.  . cholecalciferol (VITAMIN D) 1000 UNITS tablet Take 1 tablet (1,000 Units total) by mouth daily.  . diclofenac sodium (VOLTAREN) 1 % GEL Apply 2 g topically 4 (four) times daily.  . dorzolamide (TRUSOPT) 2 % ophthalmic solution Place 1 drop into the right eye 2 (two) times daily.  Marland Kitchen gabapentin (NEURONTIN) 400 MG capsule TAKE TWO CAPSULES IN THE MORNING AND AT LUNCHTIME. TAKE THREE CAPSULES AT BEDTIME.  . hydrochlorothiazide (HYDRODIURIL) 25 MG tablet Take 1 tablet (25 mg total) by mouth daily.  Marland Kitchen latanoprost (XALATAN) 0.005 % ophthalmic solution Place 1 drop into both eyes at bedtime.  . Multiple Vitamin  (MULTIVITAMIN WITH MINERALS) TABS tablet Take 1 tablet by mouth daily.  Marland Kitchen omeprazole (PRILOSEC) 20 MG capsule TAKE 1 CAPSULE BY MOUTH TWICE A DAY  . ramipril (ALTACE) 10 MG capsule TAKE 1 CAPSULE (10 MG TOTAL) BY MOUTH DAILY.  Marland Kitchen sennosides-docusate sodium (SENOKOT-S) 8.6-50 MG tablet Take 2  tablets by mouth 2 (two) times daily.   No facility-administered encounter medications on file as of 08/26/2018.     Activities of Daily Living In your present state of health, do you have any difficulty performing the following activities: 08/26/2018  Hearing? N  Vision? N  Difficulty concentrating or making decisions? Y  Comment sometimes, has difficulty with remembering where she places items   Walking or climbing stairs? Y  Comment has polio, difficulty with walking- ambulates with walker or cane  Dressing or bathing? N  Doing errands, shopping? N  Preparing Food and eating ? N  Using the Toilet? N  In the past six months, have you accidently leaked urine? Y  Do you have problems with loss of bowel control? N  Managing your Medications? N  Managing your Finances? N  Housekeeping or managing your Housekeeping? N  Some recent data might be hidden    Patient Care Team: [name], MD as PCP - General (Family Medicine) Ashok Pall, MD (Neurosurgery) Luvenia Starch Simeon Craft, PA-C (Neurosurgery) Danice Goltz, MD as Consulting Physician (Ophthalmology)    Assessment:   This is a routine wellness examination for [name].  Exercise Activities and Dietary recommendations Current Exercise Habits: Home exercise routine, Type of exercise: stretching;strength training/weights, Time (Minutes): 15, Frequency (Times/Week): 2, Weekly Exercise (Minutes/Week): 30, Intensity: Mild, Exercise limited by: orthopedic condition(s);neurologic condition(s)  Goals    . Consume 3-4 vegetables a day (pt-stated)    . Exercise 1x per week (30 min per time)       Fall Risk Fall Risk   08/26/2018 04/02/2018 10/20/2017 10/17/2017 08/04/2017  Falls in the past year? 1 0 Yes Yes No  Number falls in past yr: 0 - 1 1 -  Injury with Fall? 0 - No No -  Risk for fall due to : Impaired balance/gait - - - -  Follow up Education provided;Falls prevention discussed - - - -   Is the patient's home free of loose throw rugs in walkways, pet beds, electrical cords, etc?   yes      Grab bars in the bathroom? yes      Handrails on the stairs?   in front of home she has handrails but not in the back door entrance      Adequate lighting?   yes  Patient rating of health (0-10) scale: 8   Depression Screen PHQ 2/9 Scores 08/26/2018 04/02/2018 10/20/2017 10/17/2017  PHQ - 2 Score 0 0 0 0  PHQ- 9 Score - - - -     Cognitive Function MMSE - Mini Mental State Exam 10/19/2013 11/09/2012 09/25/2011 10/05/2010  Orientation to time 5 5 5 5   Orientation to Place 5 5 5 5   Registration 3 3 3 3   Attention/ Calculation 5 5 5 5   Recall 3 3 3 3   Language- name 2 objects 2 2 2 2   Language- repeat 1 1 1 1   Language- follow 3 step command 3 3 3 3   Language- read & follow direction 1 1 1 1   Write a sentence 1 1 1 1   Copy design 1 1 1 1   Total score 30 30 30 30      6CIT Screen 08/26/2018  What Year? 0 points  What month? 0 points  What time? 0 points  Count back from 20 0 points  Months in reverse 0 points  Repeat phrase 0 points  Total Score 0    Immunization History  Administered Date(s) Administered  . Influenza  Split 02/27/2011, 01/03/2012  . Influenza,inj,Quad PF,6+ Mos 12/23/2013, 12/20/2014, 12/08/2015, 12/27/2017  . Pneumococcal Conjugate-13 10/19/2013  . Pneumococcal Polysaccharide-23 10/05/2010    Qualifies for Shingles Vaccine? Patient reports she has had this a few years ago.   Screening Tests Health Maintenance  Topic Date Due  . FOOT EXAM  07/11/2017  . OPHTHALMOLOGY EXAM  09/16/2017  . COLONOSCOPY  08/19/2018  . HEMOGLOBIN A1C  10/02/2018  . INFLUENZA VACCINE  11/07/2018  .  TETANUS/TDAP  03/01/2019  . MAMMOGRAM  04/22/2020  . DEXA SCAN  Completed  . Hepatitis C Screening  Completed  . PNA vac Low Risk Adult  Completed   Cancer Screenings: Lung: Low Dose CT Chest recommended if Age 31-80 years, 30 pack-year currently smoking OR have quit w/in 15years. Patient does not qualify. Breast:  Up to date on Mammogram? Yes   Up to date of Bone Density/Dexa? Yes Colorectal: scheduled for next month  Additional Screenings: : Hepatitis C Screening: no    Plan:     Health Maintenance:  -Hepatitis C screening  -Scheduled for colonoscopy next month -due for A1c at next visit  I have personally reviewed and noted the following in the patient's chart:   . Medical and social history . Use of alcohol, tobacco or illicit drugs  . Current medications and supplements . Functional ability and status . Nutritional status . Physical activity . Advanced directives . List of other physicians . Hospitalizations, surgeries, and ER visits in previous 12 months . Vitals . Screenings to include cognitive, depression, and falls . Referrals and appointments  In addition, I have reviewed and discussed with patient certain preventive protocols, quality metrics, and best practice recommendations. A written personalized care plan for preventive services as well as general preventive health recommendations were provided to patient.    This visit was conducted virtually in the setting of the [place] pandemic.    [name], MD  08/26/2018

## 2018-08-27 DIAGNOSIS — H401121 Primary open-angle glaucoma, left eye, mild stage: Secondary | ICD-10-CM | POA: Diagnosis not present

## 2018-09-08 DIAGNOSIS — M5416 Radiculopathy, lumbar region: Secondary | ICD-10-CM | POA: Diagnosis not present

## 2018-09-09 ENCOUNTER — Ambulatory Visit (AMBULATORY_SURGERY_CENTER): Payer: Self-pay

## 2018-09-09 ENCOUNTER — Other Ambulatory Visit: Payer: Self-pay | Admitting: Family Medicine

## 2018-09-09 ENCOUNTER — Other Ambulatory Visit: Payer: Self-pay

## 2018-09-09 VITALS — Ht 63.0 in | Wt 189.0 lb

## 2018-09-09 DIAGNOSIS — Z1211 Encounter for screening for malignant neoplasm of colon: Secondary | ICD-10-CM

## 2018-09-09 MED ORDER — NA SULFATE-K SULFATE-MG SULF 17.5-3.13-1.6 GM/177ML PO SOLN: 1.0000 | Freq: Once | ORAL | 0 refills | Status: AC

## 2018-09-09 NOTE — Progress Notes (Signed)
No egg or soy allergy known to patient  No issues with past sedation with any surgeries  or procedures, no intubation problems  No diet pills per patient No home 02 use per patient  No blood thinners per patient  Pt denies issues with constipation  No A fib or A flutter  EMMI video sent to pt's e mail  

## 2018-09-10 ENCOUNTER — Telehealth: Payer: Self-pay

## 2018-09-10 NOTE — Telephone Encounter (Signed)
Pt called nurse line stating she would like a referral to a podiatrist (she does not like her current one.) Pt would also like something else called in to replace Diclofenac Gel, as it has gone OTC and her insurance will no ;pnger pay. Please advise.

## 2018-09-17 DIAGNOSIS — H401121 Primary open-angle glaucoma, left eye, mild stage: Secondary | ICD-10-CM | POA: Diagnosis not present

## 2018-09-21 ENCOUNTER — Other Ambulatory Visit: Payer: Self-pay | Admitting: Family Medicine

## 2018-09-21 DIAGNOSIS — I10 Essential (primary) hypertension: Secondary | ICD-10-CM

## 2018-09-23 ENCOUNTER — Encounter: Payer: Medicare Other | Admitting: Gastroenterology

## 2018-09-23 NOTE — Telephone Encounter (Signed)
Michae Kava (NP from [facility]) went for a yearly visit.  Pt is a high fall risk and fell last Friday.  Pt is unable to wear brace due to her shoes.  Michae Kava is following up on referral request from patient.   Michae Kava would like a call back to discuss pt @ [phone]. Christen Bame, CMA

## 2018-09-29 ENCOUNTER — Other Ambulatory Visit: Payer: Self-pay | Admitting: Family Medicine

## 2018-09-29 ENCOUNTER — Telehealth: Payer: Self-pay | Admitting: Gastroenterology

## 2018-09-29 DIAGNOSIS — G14 Postpolio syndrome: Secondary | ICD-10-CM

## 2018-09-29 NOTE — Telephone Encounter (Signed)
Spoke w/patient and all questions answered regarding Covid-19 Screening Questions:   Do you now or have you had a fever in the last 14 days? NO Do you have any respiratory symptoms of shortness of breath or cough now or in the last 14 days? NO Do you have any family members or close contacts with diagnosed or suspected Covid-19 in the past 14 days? NO Have you been tested for Covid-19 and found to be positive? NO

## 2018-09-29 NOTE — Telephone Encounter (Signed)
Called [place] from [facility] to discuss. I have placed a referral to a different podiatrist per patient request.  She should be able to get a recommendation or order for the correct shoe to wear with her brace in the event a custom one is to be made.

## 2018-09-30 ENCOUNTER — Encounter: Payer: Self-pay | Admitting: Gastroenterology

## 2018-09-30 ENCOUNTER — Ambulatory Visit (AMBULATORY_SURGERY_CENTER): Payer: Medicare Other | Admitting: Gastroenterology

## 2018-09-30 ENCOUNTER — Other Ambulatory Visit: Payer: Self-pay

## 2018-09-30 VITALS — BP 150/79 | HR 77 | Temp 99.1°F | Resp 19 | Ht 63.0 in | Wt 189.0 lb

## 2018-09-30 DIAGNOSIS — D122 Benign neoplasm of ascending colon: Secondary | ICD-10-CM | POA: Diagnosis not present

## 2018-09-30 DIAGNOSIS — Z1211 Encounter for screening for malignant neoplasm of colon: Secondary | ICD-10-CM | POA: Diagnosis not present

## 2018-09-30 DIAGNOSIS — D120 Benign neoplasm of cecum: Secondary | ICD-10-CM | POA: Diagnosis not present

## 2018-09-30 DIAGNOSIS — Z86010 Personal history of colonic polyps: Secondary | ICD-10-CM | POA: Diagnosis not present

## 2018-09-30 MED ORDER — SODIUM CHLORIDE 0.9 % IV SOLN: 500.0000 mL | Freq: Once | INTRAVENOUS | Status: AC

## 2018-09-30 NOTE — Patient Instructions (Signed)
YOU HAD AN ENDOSCOPIC PROCEDURE TODAY AT THE [facility]:   Refer to the procedure report that was given to you for any specific questions about what was found during the examination.  If the procedure report does not answer your questions, please call your gastroenterologist to clarify.  If you requested that your care partner not be given the details of your procedure findings, then the procedure report has been included in a sealed envelope for you to review at your convenience later.  YOU SHOULD EXPECT: Some feelings of bloating in the abdomen. Passage of more gas than usual.  Walking can help get rid of the air that was put into your GI tract during the procedure and reduce the bloating. If you had a lower endoscopy (such as a colonoscopy or flexible sigmoidoscopy) you may notice spotting of blood in your stool or on the toilet paper. If you underwent a bowel prep for your procedure, you may not have a normal bowel movement for a few days.  Please Note:  You might notice some irritation and congestion in your nose or some drainage.  This is from the oxygen used during your procedure.  There is no need for concern and it should clear up in a day or so.  SYMPTOMS TO REPORT IMMEDIATELY:   Following lower endoscopy (colonoscopy or flexible sigmoidoscopy):  Excessive amounts of blood in the stool  Significant tenderness or worsening of abdominal pains  Swelling of the abdomen that is new, acute  Fever of 100F or higher   For urgent or emergent issues, a gastroenterologist can be reached at any hour by calling [phone].   DIET:  We do recommend a small meal at first, but then you may proceed to your regular diet.  Drink plenty of fluids but you should avoid alcoholic beverages for 24 hours.  MEDICATIONS: Continue present medications.  Please see handouts given to you by your recovery nurse.  ACTIVITY:  You should plan to take it easy for the rest of today and you should  NOT DRIVE or use heavy machinery until tomorrow (because of the sedation medicines used during the test).    FOLLOW UP: Our staff will call the number listed on your records 48-72 hours following your procedure to check on you and address any questions or concerns that you may have regarding the information given to you following your procedure. If we do not reach you, we will leave a message.  We will attempt to reach you two times.  During this call, we will ask if you have developed any symptoms of COVID 19. If you develop any symptoms (ie: fever, flu-like symptoms, shortness of breath, cough etc.) before then, please call [phone].  If you test positive for Covid 19 in the 2 weeks post procedure, please call and report this information to us.    If any biopsies were taken you will be contacted by phone or by letter within the next 1-3 weeks.  Please call us at [phone] if you have not heard about the biopsies in 3 weeks.   Thank you for allowing us to provide for your healthcare needs today.   SIGNATURES/CONFIDENTIALITY: You and/or your care partner have signed paperwork which will be entered into your electronic medical record.  These signatures attest to the fact that that the information above on your After Visit Summary has been reviewed and is understood.  Full responsibility of the confidentiality of this discharge information lies with you and/or   your care-partner. 

## 2018-09-30 NOTE — Progress Notes (Signed)
Called to room to assist during endoscopic procedure.  Patient ID and intended procedure confirmed with present staff. Received instructions for my participation in the procedure from the performing physician.  

## 2018-09-30 NOTE — Op Note (Addendum)
[facility] Patient Name: [name] Procedure Date: 09/30/2018 9:43 AM MRN: [id number] Endoscopist: Thornton Park MD, MD Age: 73 Referring MD:  Date of Birth: 1945-06-06 Gender: Female Account #: [id number] Procedure:                Colonoscopy Indications:              Screening for colorectal malignant neoplasm                           Screening colonoscopy with Dr. Deatra Ina 08/18/2008                           No known family history of colon cancer or polyps. Medicines:                See the Anesthesia note for documentation of the                            administered medications Procedure:                Pre-Anesthesia Assessment:                           - Prior to the procedure, a History and Physical                            was performed, and patient medications and                            allergies were reviewed. The patient's tolerance of                            previous anesthesia was also reviewed. The risks                            and benefits of the procedure and the sedation                            options and risks were discussed with the patient.                            All questions were answered, and informed consent                            was obtained. Prior Anticoagulants: The patient has                            taken no previous anticoagulant or antiplatelet                            agents. ASA Grade Assessment: II - A patient with                            mild systemic disease. After reviewing the risks  and benefits, the patient was deemed in                            satisfactory condition to undergo the procedure.                           After obtaining informed consent, the colonoscope                            was passed under direct vision. Throughout the                            procedure, the patient's blood pressure, pulse, and                            oxygen  saturations were monitored continuously. The                            Colonoscope was introduced through the anus and                            advanced to the the terminal ileum, with                            identification of the appendiceal orifice and IC                            valve. A second forward view of the right colon was                            performed. The colonoscopy was performed without                            difficulty. The patient tolerated the procedure                            well. The quality of the bowel preparation was                            good. The terminal ileum, ileocecal valve,                            appendiceal orifice, and rectum were photographed. Scope In: 9:51:47 AM Scope Out: 10:05:15 AM Scope Withdrawal Time: 0 hours 10 minutes 55 seconds  Total Procedure Duration: 0 hours 13 minutes 28 seconds  Findings:                 The perianal and digital rectal examinations were                            normal.                           Multiple small and large-mouthed diverticula were  found in the entire colon.                           A 2 mm polyp was found in the cecum. The polyp was                            sessile. The polyp was removed with a cold snare.                            Resection and retrieval were complete. Estimated                            blood loss was minimal.                           A 3 mm polyp was found in the ascending colon. The                            polyp was sessile. The polyp was removed with a                            cold snare. Resection and retrieval were complete.                            Estimated blood loss was minimal.                           The exam was otherwise without abnormality on                            direct and retroflexion views. Complications:            No immediate complications. Estimated blood loss:                             Minimal. Estimated Blood Loss:     Estimated blood loss was minimal. Impression:               - Diverticulosis in the entire examined colon.                           - One 2 mm polyp in the cecum, removed with a cold                            snare. Resected and retrieved.                           - One 3 mm polyp in the ascending colon, removed                            with a cold snare. Resected and retrieved.                           - The examination was otherwise normal on direct  and retroflexion views. Recommendation:           - Patient has a contact number available for                            emergencies. The signs and symptoms of potential                            delayed complications were discussed with the                            patient. Return to normal activities tomorrow.                            Written discharge instructions were provided to the                            patient.                           - Resume regular diet today. High fibert diet                            recommended.                           - Continue present medications.                           - Await pathology results.                           - Routine colon cancer screening and surveillance                            usually stops at age 66. Could consider repeat                            colonoscopy in 7 years given the polyps removed                            today if another colonoscopy is clinically                            appropriate at that time. Thornton Park MD, MD 09/30/2018 10:10:54 AM This report has been signed electronically.

## 2018-09-30 NOTE — Progress Notes (Signed)
Pt's states no medical or surgical changes since previsit or office visit. 

## 2018-10-02 ENCOUNTER — Telehealth: Payer: Self-pay

## 2018-10-02 NOTE — Telephone Encounter (Signed)
  Follow up Call-  Call back number 09/30/2018  Post procedure Call Back phone  # [phone]  Permission to leave phone message Yes  Some recent data might be hidden     Patient questions:  Do you have a fever, pain , or abdominal swelling? No. Pain Score  0 *  Have you tolerated food without any problems? Yes.    Have you been able to return to your normal activities? Yes.    Do you have any questions about your discharge instructions: Diet   No. Medications  No. Follow up visit  No.  Do you have questions or concerns about your Care? No.  Actions: * If pain score is 4 or above: No action needed, pain <4. 1. Have you developed a fever since your procedure? no  2.   Have you had an respiratory symptoms (SOB or cough) since your procedure? no  3.   Have you tested positive for COVID 19 since your procedure no  4.   Have you had any family members/close contacts diagnosed with the COVID 19 since your procedure?  no   If yes to any of these questions please route to Joylene John, RN and Alphonsa Gin, Therapist, sports.

## 2018-10-04 ENCOUNTER — Encounter: Payer: Self-pay | Admitting: Gastroenterology

## 2018-10-07 DIAGNOSIS — M5136 Other intervertebral disc degeneration, lumbar region: Secondary | ICD-10-CM | POA: Insufficient documentation

## 2018-10-07 DIAGNOSIS — M5416 Radiculopathy, lumbar region: Secondary | ICD-10-CM | POA: Diagnosis not present

## 2018-10-12 ENCOUNTER — Telehealth: Payer: Self-pay | Admitting: *Deleted

## 2018-10-12 NOTE — Telephone Encounter (Signed)
Hey, sorry, does this mean you'd like me to prescribe the diclofenac gel? I'm not sure what the question is. thanks

## 2018-10-12 NOTE — Telephone Encounter (Addendum)
Pt calls because the pharmacy told her sometimes insurance still pays for diclofenac gel if we request it.    Spoke with pharmacy and they have seen it covered if PA was obtained.    Submitted via covermymeds, this was the response.  Additional Information Required This medication or product is on your plan's list of covered drugs. Prior authorization is not required at this time. If your pharmacy has questions regarding the processing of your prescription, please have them call the [facility] pharmacy help desk at [phone]. **Please note: Formulary lowering, tiering exception, cost reduction and/or pre-benefit determination review (including prospective Medicare hospice reviews) requests cannot be requested using this method of submission. Please contact us at [phone] instead.  Pt and pharmacy (VM) informed.  Pt appreciative for attempt. Christen Bame, CMA

## 2018-10-13 NOTE — Telephone Encounter (Signed)
No, since Voltaren gel is over the counter now some insurance are not covering it.  But it is still to expensive for some patients.   Pharmacy thought if I ran a PA they would cover it.  I attempted to do so but insurance denied.  This was just an [place].  No action needed.  Thanks!

## 2018-10-16 DIAGNOSIS — G5761 Lesion of plantar nerve, right lower limb: Secondary | ICD-10-CM | POA: Diagnosis not present

## 2018-10-16 DIAGNOSIS — M21961 Unspecified acquired deformity of right lower leg: Secondary | ICD-10-CM | POA: Diagnosis not present

## 2018-10-16 DIAGNOSIS — G5762 Lesion of plantar nerve, left lower limb: Secondary | ICD-10-CM | POA: Diagnosis not present

## 2018-10-16 DIAGNOSIS — M21962 Unspecified acquired deformity of left lower leg: Secondary | ICD-10-CM | POA: Diagnosis not present

## 2018-10-26 ENCOUNTER — Other Ambulatory Visit: Payer: Self-pay | Admitting: *Deleted

## 2018-10-26 MED ORDER — BACLOFEN 10 MG PO TABS: ORAL_TABLET | ORAL | 1 refills | Status: DC

## 2018-11-26 ENCOUNTER — Ambulatory Visit (INDEPENDENT_AMBULATORY_CARE_PROVIDER_SITE_OTHER): Payer: Medicare Other | Admitting: Student in an Organized Health Care Education/Training Program

## 2018-11-26 ENCOUNTER — Other Ambulatory Visit: Payer: Self-pay

## 2018-11-26 ENCOUNTER — Encounter: Payer: Self-pay | Admitting: Student in an Organized Health Care Education/Training Program

## 2018-11-26 VITALS — BP 152/82 | HR 66

## 2018-11-26 DIAGNOSIS — E1142 Type 2 diabetes mellitus with diabetic polyneuropathy: Secondary | ICD-10-CM | POA: Diagnosis not present

## 2018-11-26 DIAGNOSIS — I10 Essential (primary) hypertension: Secondary | ICD-10-CM | POA: Diagnosis not present

## 2018-11-26 LAB — POCT GLYCOSYLATED HEMOGLOBIN (HGB A1C): HbA1c, POC (controlled diabetic range): 6.2 % (ref 0.0–7.0)

## 2018-11-26 MED ORDER — LOSARTAN POTASSIUM 25 MG PO TABS: 25.0000 mg | ORAL_TABLET | Freq: Every day | ORAL | 3 refills | Status: DC

## 2018-11-26 NOTE — Assessment & Plan Note (Signed)
Blood pressure elevated 152/82 today.  Was elevated at last visit as well.  Patient states that she is consistent with her blood pressure medicines but has not taken them this morning yet.  She is amenable to changing from ramipril to losartan today.  Checking electrolyte function today for baseline. Recommended patient take new blood pressure medicine at bedtime. Requested patient come back in 2 weeks for monitoring of blood pressure on new medication. Also checking urine microalbumin today.

## 2018-11-26 NOTE — Progress Notes (Signed)
   Subjective:    Patient ID: [name], female    DOB: 22-Feb-1946, 73 y.o.   MRN: [id number]   CC: Check kidneys  HPI:  Hypertension-patient states that she has been taking her blood pressure medication every day- HCTZ and ramipril.  She did not take it this morning yet.  Blood pressure in office is 152/82.  Heart rate 66.  He denies any chest pain or shortness of breath.  She would like to check her kidneys because she had multiple siblings on dialysis prior to their demise.  She does not currently have any known symptoms of kidney dysfunction.   Orthopedic shoes-patient states that she has gotten orthopedic shoes every year from her podiatrist in the past.  This year her new podiatrist covered by insurance requested that she get the shoes on her own.  Patient is not satisfied with that and would like a new referral to a podiatrist.  She would also like to talk to a new podiatrist about her Morton's neuroma.  She takes gabapentin which helps with her neuropathy.  Home health aide-patient states that she has become more fatigued while cleaning her home.  She denies any shortness of breath, chest pain, syncope, leg swelling. she would like somebody to come assist in cleaning her home.  We discussed that it is not an appropriate referral for me to have somebody come clean her house for her.    Smoking status reviewed   ROS: pertinent noted in the HPI   Past medical history, surgical, family, and social history reviewed and updated in the EMR as appropriate.  Objective:  BP (!) 152/82   Pulse 66   SpO2 98%   Vitals and nursing note reviewed  General: NAD, pleasant, able to participate in exam Cardiac: RRR, S1 S2 present. normal heart sounds, no murmurs. Respiratory: CTAB, normal effort, No wheezes, rales or rhonchi Extremities: no edema or cyanosis. Skin: warm and dry, no rashes noted Neuro: alert, no obvious focal deficits Psych: Normal affect and mood   Assessment & Plan:     HTN (hypertension) Blood pressure elevated 152/82 today.  Was elevated at last visit as well.  Patient states that she is consistent with her blood pressure medicines but has not taken them this morning yet.  She is amenable to changing from ramipril to losartan today.  Checking electrolyte function today for baseline. Recommended patient take new blood pressure medicine at bedtime. Requested patient come back in 2 weeks for monitoring of blood pressure on new medication. Also checking urine microalbumin today.  Put in home health physical therapy request. Put in podiatry referral.  A1c checked today and is 6.2 from 6.4 at last check.  Doristine Mango, [place] [facility] PGY-2

## 2018-11-26 NOTE — Patient Instructions (Signed)
It was a pleasure to see you today!  To summarize our discussion for this visit:  I am putting in a new referral for podiatry today to get your orthopedic shoes  I have requested a home health service to come to your home and assist you in strength training  We are monitoring your kidney function today.  As your blood pressure is elevated today I am going to change 1 of your blood pressure medications to losartan.  I would recommend taking this medication before bed daily.  Please come back and see me after you have been on this medication for a couple of weeks to recheck your blood pressure.   Some additional health maintenance measures we should update are: Health Maintenance Due  Topic Date Due  . FOOT EXAM  07/11/2017  . OPHTHALMOLOGY EXAM  09/16/2017  . HEMOGLOBIN A1C  10/02/2018  . INFLUENZA VACCINE  11/07/2018  .    Please return to our clinic to see me in about 2 weeks.  Call the clinic at [phone] if your symptoms worsen or you have any concerns.   Thank you for allowing me to take part in your care,  Dr. Doristine Mango

## 2018-11-27 LAB — BASIC METABOLIC PANEL
BUN/Creatinine Ratio: 13 (ref 12–28)
BUN: 10 mg/dL (ref 8–27)
CO2: 25 mmol/L (ref 20–29)
Calcium: 10 mg/dL (ref 8.7–10.3)
Chloride: 103 mmol/L (ref 96–106)
Creatinine, Ser: 0.75 mg/dL (ref 0.57–1.00)
GFR calc Af Amer: 91 mL/min/{1.73_m2} (ref >59)
GFR calc non Af Amer: 79 mL/min/{1.73_m2} (ref >59)
Glucose: 96 mg/dL (ref 65–99)
Potassium: 4.3 mmol/L (ref 3.5–5.2)
Sodium: 142 mmol/L (ref 134–144)

## 2018-11-27 LAB — MICROALBUMIN / CREATININE URINE RATIO
Creatinine, Urine: 57 mg/dL
Microalb/Creat Ratio: 10 mg/g creat (ref 0–29)
Microalbumin, Urine: 5.8 ug/mL

## 2018-12-11 ENCOUNTER — Other Ambulatory Visit: Payer: Self-pay

## 2018-12-11 ENCOUNTER — Ambulatory Visit (INDEPENDENT_AMBULATORY_CARE_PROVIDER_SITE_OTHER): Payer: Medicare Other | Admitting: Student in an Organized Health Care Education/Training Program

## 2018-12-11 VITALS — BP 125/80 | HR 71 | Wt 191.4 lb

## 2018-12-11 DIAGNOSIS — I10 Essential (primary) hypertension: Secondary | ICD-10-CM | POA: Diagnosis not present

## 2018-12-11 NOTE — Patient Instructions (Signed)
It was a pleasure to see you today!  To summarize our discussion for this visit:  Your blood pressure is in a wonderful range today. I'm glad to hear the new medication is working well for you. We will check your kidneys again today to see how they are functioning with the new medication but I don't predict a problem as they were functioning perfectly at our recent check on a similar medicine.  Please let me know if you need anything else as far as your podiatrist appointment goes.   I will get the information to you about a home health aid    Please return to our clinic to see me in about 3 months.  Call the clinic at [phone] if your symptoms worsen or you have any concerns.   Thank you for allowing me to take part in your care,  Dr. Doristine Mango

## 2018-12-11 NOTE — Assessment & Plan Note (Signed)
Continue on daily losartan (nightly) and HCTZ. Collecting BMP today to monitor renal function and response to starting losartan. Blood pressure 125/80 today.  Continue to monitor at next appointment.

## 2018-12-11 NOTE — Progress Notes (Signed)
   Subjective:    Patient ID: [name], female    DOB: 16-Jul-1945, 73 y.o.   MRN: [id number]   CC: Following up on blood pressure  HPI:  Blood pressure-patient was switched from ramipril to losartan at her last visit.  She states that she has had no adverse effects from the medication.  She does describe having some difficulty in switching from morning to night administration of her medications.  However, she states that as long she keeps medication at her neck side table she is able to remember to take it every night.  She denies any periods of near syncope, chest pain, shortness of breath.  Her blood pressure today is 125/80.  Patient has appointment with new podiatrist on the 16th and she is looking forward to that.  Patient had her flu shot on Saturday, August 29.  Patient is looking for information on a home health aide which I will send her when I can find it.  Smoking status reviewed   ROS: pertinent noted in the HPI  Past medical history, surgical, family, and social history reviewed and updated in the EMR as appropriate.  Objective:  BP 125/80   Pulse 71   Wt 191 lb 6.4 oz (86.8 kg)   SpO2 97%   BMI 33.90 kg/m   Vitals and nursing note reviewed  General: NAD, pleasant, able to participate in exam Cardiac: RRR, S1 S2 present. normal heart sounds, no murmurs. Respiratory: CTAB, normal effort, No wheezes, rales or rhonchi Extremities: no edema or cyanosis. Skin: warm and dry, no rashes noted Neuro: alert, no obvious focal deficits Psych: Normal affect and mood   Assessment & Plan:    HTN (hypertension) Continue on daily losartan (nightly) and HCTZ. Collecting BMP today to monitor renal function and response to starting losartan. Blood pressure 125/80 today.  Continue to monitor at next appointment.  Follow-up on paperwork for home health aide.  Doristine Mango, [place] [facility] PGY-2

## 2018-12-12 LAB — BASIC METABOLIC PANEL
BUN/Creatinine Ratio: 15 (ref 12–28)
BUN: 11 mg/dL (ref 8–27)
CO2: 24 mmol/L (ref 20–29)
Calcium: 9.6 mg/dL (ref 8.7–10.3)
Chloride: 106 mmol/L (ref 96–106)
Creatinine, Ser: 0.72 mg/dL (ref 0.57–1.00)
GFR calc Af Amer: 96 mL/min/{1.73_m2} (ref >59)
GFR calc non Af Amer: 83 mL/min/{1.73_m2} (ref >59)
Glucose: 103 mg/dL — ABNORMAL HIGH (ref 65–99)
Potassium: 4.2 mmol/L (ref 3.5–5.2)
Sodium: 142 mmol/L (ref 134–144)

## 2018-12-15 ENCOUNTER — Other Ambulatory Visit: Payer: Self-pay

## 2018-12-16 MED ORDER — GABAPENTIN 400 MG PO CAPS: 400.0000 mg | ORAL_CAPSULE | Freq: Every day | ORAL | 0 refills | Status: DC

## 2018-12-18 DIAGNOSIS — H401113 Primary open-angle glaucoma, right eye, severe stage: Secondary | ICD-10-CM | POA: Diagnosis not present

## 2018-12-23 ENCOUNTER — Ambulatory Visit (INDEPENDENT_AMBULATORY_CARE_PROVIDER_SITE_OTHER): Payer: Medicare Other | Admitting: Podiatry

## 2018-12-23 ENCOUNTER — Ambulatory Visit (INDEPENDENT_AMBULATORY_CARE_PROVIDER_SITE_OTHER): Payer: Medicare Other

## 2018-12-23 ENCOUNTER — Other Ambulatory Visit: Payer: Self-pay

## 2018-12-23 DIAGNOSIS — M21769 Unequal limb length (acquired), unspecified tibia and fibula: Secondary | ICD-10-CM | POA: Diagnosis not present

## 2018-12-23 DIAGNOSIS — G5763 Lesion of plantar nerve, bilateral lower limbs: Secondary | ICD-10-CM

## 2018-12-23 DIAGNOSIS — E0843 Diabetes mellitus due to underlying condition with diabetic autonomic (poly)neuropathy: Secondary | ICD-10-CM | POA: Diagnosis not present

## 2018-12-23 MED ORDER — PREGABALIN 150 MG PO CAPS: 150.0000 mg | ORAL_CAPSULE | Freq: Two times a day (BID) | ORAL | 2 refills | Status: DC

## 2018-12-26 NOTE — Progress Notes (Signed)
   HPI: 73 year old female with past medical history of diabetes mellitus presenting today for follow up evaluation of peripheral neuropathy. She reports continued numbness and tingling of the feet. She also reports pain secondary to a Morton's neuroma of the bilateral feet, left worse than right. She has not had any treatment for her symptoms. There are no modifying factors noted. Patient is here for further evaluation and treatment.   Past Medical History:  Diagnosis Date  . Arthritis    osteoarthritis  . Carpal tunnel syndrome on both sides    Followed by Dr. Burney Gauze  . Cataracts, bilateral    immature  . Chronic back pain    stenosis  . DDD (degenerative disc disease)    Lumbar Spondylosis  . Diabetes mellitus    takes Metformin daily  . GERD (gastroesophageal reflux disease)    takes Omeprazole daily  . Glaucoma   . Hyperlipidemia    takes Atorvastatin daily  . Hypertension    takes Ramipril daily as well as HCTZ  . Muscle spasm    takes Baclofen as needed  . 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 Peripheral neuropathy    takes Gabapentin daily  . 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 Vitamin D deficiency    takes Vit D daily     Physical Exam: General: The patient is alert and oriented x3 in no acute distress.  Dermatology: Skin is warm, dry and supple bilateral lower extremities. Negative for open lesions or macerations.  Vascular: Palpable pedal pulses bilaterally. No edema or erythema noted. Capillary refill within normal limits.  Neurological: Epicritic and protective threshold diminished bilaterally.   Musculoskeletal Exam: Sharp pain with palpation of the bilateral 4th interspace and lateral compression of the metatarsal heads consistent with neuroma. Positive Conley Canal sign with loadbearing of the forefoot.  Limb length discrepancy with right lower extremity shorter than the left lower extremity.  Range of motion  within normal limits to all pedal and ankle joints bilateral. Muscle strength 5/5 in all groups bilateral.      Assessment: 1. DM with peripheral neuropathy 2. 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 Limb length discrepancy, right shorter than left   Plan of Care:  1. Patient evaluated. X-Rays reviewed.  2. Injection of 0.5 mLs Celestone Soluspan injected into the neuroma of the bilateral 4th interspace.  3. Prescription for Lyrica provided to patient. Discontinue taking Gabapentin.  4. Appointment with Liliane Channel, Pedorthist, for DM shoes. 5. Return to clinic as needed.      Edrick Kins, DPM [facility]  Dr. Edrick Kins, DPM    [address], [place] [address]                Office [phone]  Fax [phone]

## 2019-01-04 ENCOUNTER — Ambulatory Visit: Payer: Medicare Other | Admitting: Orthotics

## 2019-01-04 ENCOUNTER — Other Ambulatory Visit: Payer: Self-pay

## 2019-01-04 DIAGNOSIS — M21769 Unequal limb length (acquired), unspecified tibia and fibula: Secondary | ICD-10-CM

## 2019-01-04 DIAGNOSIS — G5763 Lesion of plantar nerve, bilateral lower limbs: Secondary | ICD-10-CM

## 2019-01-04 DIAGNOSIS — E0843 Diabetes mellitus due to underlying condition with diabetic autonomic (poly)neuropathy: Secondary | ICD-10-CM

## 2019-01-04 DIAGNOSIS — E119 Type 2 diabetes mellitus without complications: Secondary | ICD-10-CM

## 2019-01-04 NOTE — Progress Notes (Signed)
[employer] order number [id number]  Docs only in safestep.  Richy to fab f/o

## 2019-01-11 DIAGNOSIS — M5416 Radiculopathy, lumbar region: Secondary | ICD-10-CM | POA: Diagnosis not present

## 2019-02-08 ENCOUNTER — Other Ambulatory Visit: Payer: Self-pay | Admitting: Student in an Organized Health Care Education/Training Program

## 2019-02-11 DIAGNOSIS — M5416 Radiculopathy, lumbar region: Secondary | ICD-10-CM | POA: Diagnosis not present

## 2019-02-11 DIAGNOSIS — I10 Essential (primary) hypertension: Secondary | ICD-10-CM | POA: Diagnosis not present

## 2019-02-11 DIAGNOSIS — M4150 Other secondary scoliosis, site unspecified: Secondary | ICD-10-CM | POA: Diagnosis not present

## 2019-03-12 ENCOUNTER — Other Ambulatory Visit: Payer: Self-pay

## 2019-03-12 ENCOUNTER — Encounter: Payer: Self-pay | Admitting: Student in an Organized Health Care Education/Training Program

## 2019-03-12 ENCOUNTER — Ambulatory Visit (INDEPENDENT_AMBULATORY_CARE_PROVIDER_SITE_OTHER): Payer: Medicare Other | Admitting: Student in an Organized Health Care Education/Training Program

## 2019-03-12 VITALS — BP 104/62 | HR 70 | Wt 191.6 lb

## 2019-03-12 DIAGNOSIS — E1142 Type 2 diabetes mellitus with diabetic polyneuropathy: Secondary | ICD-10-CM

## 2019-03-12 DIAGNOSIS — G6281 Critical illness polyneuropathy: Secondary | ICD-10-CM | POA: Diagnosis not present

## 2019-03-12 DIAGNOSIS — I10 Essential (primary) hypertension: Secondary | ICD-10-CM

## 2019-03-12 DIAGNOSIS — G629 Polyneuropathy, unspecified: Secondary | ICD-10-CM | POA: Insufficient documentation

## 2019-03-12 LAB — POCT GLYCOSYLATED HEMOGLOBIN (HGB A1C): HbA1c, POC (controlled diabetic range): 6 % (ref 0.0–7.0)

## 2019-03-12 MED ORDER — GABAPENTIN 600 MG PO TABS: 600.0000 mg | ORAL_TABLET | Freq: Every day | ORAL | 1 refills | Status: DC

## 2019-03-12 NOTE — Patient Instructions (Signed)
It was a pleasure to see you today!  To summarize our discussion for this visit:  I'm sorry to hear that you have been having a bad experience with the lyrica. We can discontinue this medication. Please take it only once a day for a week and then none.  We can increase your gabapentin to compensate so, you can take the rest of your current prescription and then I will increase your dose.  Your blood sugars look to be well controlled. We can space out how often we are checking.   Some additional health maintenance measures we should update are: Health Maintenance Due  Topic Date Due  . FOOT EXAM  07/11/2017  . OPHTHALMOLOGY EXAM  09/16/2017  . TETANUS/TDAP  03/01/2019  .   Please return to our clinic to see me in about 6 months.  Call the clinic at [phone] if your symptoms worsen or you have any concerns.   Thank you for allowing me to take part in your care,  Dr. Doristine Mango

## 2019-03-12 NOTE — Assessment & Plan Note (Signed)
Associated with post-polio syndrome.  Discontinuing lyrica with a taper. Continue gabapentin and increase dose when patient runs out of current prescription as discussed with patient. In combination with depressive symptoms, I believe patient could benefit from SNRI but patient would not like to start any new medications at this time. We will discuss at next appointment.

## 2019-03-12 NOTE — Assessment & Plan Note (Signed)
Well-controlled.  Considering discontinuing Metformin however, patient endorses desire for weight loss so we will continue Metformin for now. Denies symptoms of hypoglycemia Check A1c in 6 months

## 2019-03-12 NOTE — Progress Notes (Signed)
   Subjective:    Patient ID: [name], female    DOB: 11-Jan-1946, 73 y.o.   MRN: [id number]  CC: Discuss medications and home help.  HPI:  Lyrica-patient describes taking Lyrica as prescribed and having some benefit for her nerve pain.  She believes that it has caused weight gain.  Her weight is the same as her last visit 3 months ago today.  Denies other negative symptoms but would like to discontinue the medication.  She continues to take gabapentin at night as well. Symptoms are unchanged and worse at night.  Home help-patient I can request assistance and having someone to come clean her home.  She states that she has fallen without injury twice while trying to clean her house and is easily fatigued.  I discussed that I attempted to get her home health services which were denied.  I recommended that she contact her insurance company for possible benefits.  I also recommended physical therapy which patient declined.  Glaucoma/diabetes-patient is followed by ophthalmology and was last seen in January and told to follow-up in 1 year. Adherent with eye drops. Endorses well controlled blood sugars and poor diet. Denies polyuria/polydipsia.   HTN-adherent with medications at home.  Denies symptoms of hypotension.  Depression screening-PHQ score of 14 with answering 0 to question 9.  Denies SI or HI.  Smoking status reviewed   ROS: pertinent noted in the HPI  I have personally reviewed pertinent past medical history, surgical, family, and social history as appropriate. Objective:  BP 104/62   Pulse 70   Wt 191 lb 9.6 oz (86.9 kg)   SpO2 99%   BMI 33.94 kg/m   Vitals and nursing note reviewed  General: NAD, pleasant, able to participate in exam Cardiac: RRR, S1 S2 present. normal heart sounds, no murmurs. Respiratory: CTAB, normal effort, No wheezes, rales or rhonchi Extremities: Trace lower extremity edema or cyanosis.  Weakness of extension of bilateral hips and knees worse on  right versus left.  Patient walks with a cane.  Able to stand from seated position with assistance of cane and without obvious difficulty. Skin: warm and dry, no rashes noted Neuro: alert, no obvious focal deficits Psych: Normal affect and mood  Assessment & Plan:   HTN (hypertension) Well-controlled, kidney function at last visit within normal limits.  Without hypertensive or hypotensive symptoms  T2DM (type 2 diabetes mellitus) Well-controlled.  Considering discontinuing Metformin however, patient endorses desire for weight loss so we will continue Metformin for now. Denies symptoms of hypoglycemia Check A1c in 6 months  Peripheral neuropathy Associated with post-polio syndrome.  Discontinuing lyrica with a taper. Continue gabapentin and increase dose when patient runs out of current prescription as discussed with patient. In combination with depressive symptoms, I believe patient could benefit from SNRI but patient would not like to start any new medications at this time. We will discuss at next appointment.   Orders Placed This Encounter  Procedures  . HgB A1c    Meds ordered this encounter  Medications  . gabapentin (NEURONTIN) 600 MG tablet    Sig: Take 1 tablet (600 mg total) by mouth at bedtime.    Dispense:  90 tablet    Refill:  [place], [place] PGY-2

## 2019-03-12 NOTE — Assessment & Plan Note (Signed)
Well-controlled, kidney function at last visit within normal limits.  Without hypertensive or hypotensive symptoms

## 2019-03-19 ENCOUNTER — Other Ambulatory Visit: Payer: Self-pay | Admitting: Student in an Organized Health Care Education/Training Program

## 2019-03-19 DIAGNOSIS — G14 Postpolio syndrome: Secondary | ICD-10-CM

## 2019-03-24 ENCOUNTER — Ambulatory Visit: Payer: Medicare Other | Admitting: Licensed Clinical Social Worker

## 2019-03-24 DIAGNOSIS — I10 Essential (primary) hypertension: Secondary | ICD-10-CM

## 2019-03-24 DIAGNOSIS — Z7189 Other specified counseling: Secondary | ICD-10-CM

## 2019-03-24 DIAGNOSIS — E1142 Type 2 diabetes mellitus with diabetic polyneuropathy: Secondary | ICD-10-CM

## 2019-03-24 DIAGNOSIS — G6281 Critical illness polyneuropathy: Secondary | ICD-10-CM

## 2019-03-24 NOTE — Patient Instructions (Signed)
Licensed Clinical Social Worker Visit Information Ms. Ruscoe  it was nice speaking with you. Please call me directly if you have questions [phone] Goals we discussed today:  Goals Addressed            This Visit's Progress   . Advanced Care Planning       Current Barriers:  . Patient with HTN, DMII, and Peripheral neuropathy does not have Advance Directives . Patient needs support, education, and care coordination in order to complete Advance Directive Clinical Social Work Clinical Goal(s):  Marland Kitchen Over the next 30 days, the patient will review mailed EMMI education on Advance Directive . Over the next 30 days, patient will verbalize basic understanding of Advanced Directives and importance of completion Interventions: . A voluntary discussion about advanced care planning including explanation and discussion of advanced directives was extentively discussed with the patient.   Patient Self Care Activities:  . Is able to complete documentation independently . Is able to readily make contact with social support system . Unable to identify, power or attorney at this time but will think about this before next F/U call . Initial goal documentation     . Client will have ADL needs addressed by Personal Care Serivice Aide       Current Barriers:   . Unmet personal care needs for patient with HTN, DMII, and Peripheral neuropathy. . Patient needs support, education, and care coordination needs in order to obtain [place] Work Goal(s): Over the next 30 days, patient will   . Work with [employer] [facility], and [facility] to coordinate care needs for [facility] . Select a personal care service provider  . Return calls from [facility] to set up initial PCS assessment once application is approved . Call [facility] with questions [phone] or [phone]  Interventions for PCS: . Assessed needs and provided education on Personal Care process, services and  level of care . Collaborate with primary care provider ref completing PCS referral . PCS referral will be faxed to [employer] at [phone] once completed and signed by PCP . LCSW will collaborate with [facility] to verify application is received and processed.  Patient Self Care Activities & Deficits:  . Perform ADL's with assistance from support system until [facility] is approved . Call provider office for new concerns or questions . Unable to perform ADLs independently without assistance  Initial goal documentation        Materials provided: Yes: advance directive information mailed Ms. Matott was given information about Care Management services today including:  1. Care Management services include personalized support from designated clinical staff supervised by her physician, including individualized plan of care and coordination with other care providers 2. 24/7 contact [phone] for assistance for urgent and routine care needs. 3. Care Management services at any time by phone call to the office staff.  Patient agreed to services and verbal consent obtained.    The patient verbalized understanding of instructions provided today and declined a print copy of patient instruction materials.  Follow up plan:  SW will follow up with patient by phone over the next 30 days  [name], LCSW

## 2019-03-24 NOTE — Chronic Care Management (AMB) (Signed)
Social Work Care Management  Initial Visit Note  03/24/2019 Name: [name] MRN: [id number] DOB: Feb 10, 1946  Assessment: [name] is a 73 y.o. year old female who sees Richarda Osmond, DO for primary care. The care management team was consulted for assistance with care management and care coordination needs related to Level of Care Concerns and assess for [place]. SDOH (Social Determinants of Health) screening performed today. See Care Plan for related entries.   Review of patient status, including review of consultants reports, relevant laboratory and other test results, and collaboration with appropriate care team members and the patient's provider was performed as part of comprehensive patient evaluation and provision of care management services.   Outpatient Encounter Medications as of 03/24/2019  Medication Sig  . aspirin 81 MG tablet Take 1 tablet (81 mg total) by mouth daily.  Marland Kitchen atorvastatin (LIPITOR) 40 MG tablet TAKE 1 TABLET (40 MG TOTAL) BY MOUTH DAILY.  . baclofen (LIORESAL) 10 MG tablet TAKE 1/2 TABLET 3 TIMES A DAY AS NEEDED FOR MUSCLE SPASM  . brimonidine-timolol (COMBIGAN) 0.2-0.5 % ophthalmic solution Place 1 drop into both eyes every 12 (twelve) hours. 1 drop each eye every 12 hours  . calcium carbonate (OS-CAL) 600 MG TABS Take 1 tablet (600 mg total) by mouth daily.  . cholecalciferol (VITAMIN D) 1000 UNITS tablet Take 1 tablet (1,000 Units total) by mouth daily.  . dorzolamide (TRUSOPT) 2 % ophthalmic solution Place 1 drop into the right eye 2 (two) times daily.  . dorzolamide (TRUSOPT) 2 % ophthalmic solution   . gabapentin (NEURONTIN) 600 MG tablet Take 1 tablet (600 mg total) by mouth at bedtime.  . hydrochlorothiazide (HYDRODIURIL) 25 MG tablet Take 1 tablet (25 mg total) by mouth daily.  Marland Kitchen HYDROcodone-acetaminophen (NORCO) 10-325 MG tablet TAKE 1 TABLET BY ORAL ROUTE EVERY 8 HOURS AS NEEDED FOR PAIN, 30 DAY SUPPLY  . latanoprost  (XALATAN) 0.005 % ophthalmic solution Place 1 drop into both eyes at bedtime.  Marland Kitchen losartan (COZAAR) 25 MG tablet Take 1 tablet (25 mg total) by mouth at bedtime.  . metFORMIN (GLUCOPHAGE) 500 MG tablet TAKE 1 TABLET TWICE A DAY WITH A MEAL  . Multiple Vitamin (MULTIVITAMIN WITH MINERALS) TABS tablet Take 1 tablet by mouth daily.  Marland Kitchen omeprazole (PRILOSEC) 20 MG capsule TAKE 1 CAPSULE BY MOUTH TWICE A DAY  . RHOPRESSA 0.02 % SOLN   . VYZULTA 0.024 % SOLN    Facility-Administered Encounter Medications as of 03/24/2019  Medication  . 0.9 %  sodium chloride infusion    Goals Addressed            This Visit's Progress   . Advanced Care Planning       Current Barriers:  . Patient with HTN, DMII, and Peripheral neuropathy does not have Advance Directives . Patient needs support, education, and care coordination in order to complete Advance Directive Clinical Social Work Clinical Goal(s):  Marland Kitchen Over the next 30 days, the patient will review mailed EMMI education on Advance Directive . Over the next 30 days, patient will verbalize basic understanding of Advanced Directives and importance of completion Interventions: . A voluntary discussion about advanced care planning including explanation and discussion of advanced directives was extentively discussed with the patient.   Patient Self Care Activities:  . Is able to complete documentation independently . Is able to readily make contact with social support system . Unable to identify, power or attorney at this time but will think about this  before next F/U call . Initial goal documentation     . Client will have ADL needs addressed by Personal Care Serivice Aide       Current Barriers:   . Unmet personal care needs for patient with HTN, DMII, and Peripheral neuropathy. . Patient needs support, education, and care coordination needs in order to obtain [place] Work Goal(s): Over the next 30 days, patient will    . Work with [employer], and [facility] to coordinate care needs for [facility] . Select a personal care service provider  . Return calls from [facility] to set up initial PCS assessment once application is approved . Call [facility] with questions [phone] or [phone]  Interventions for PCS: . Assessed needs and provided education on Personal Care process, services and level of care . Collaborate with primary care provider ref completing PCS referral . PCS referral will be faxed to [employer] at [phone] once completed and signed by PCP . LCSW will collaborate with [facility] to verify application is received and processed.  Patient Self Care Activities & Deficits:  . Perform ADL's with assistance from support system until [facility] is approved . Call provider office for new concerns or questions . Unable to perform ADLs independently without assistance  Initial goal documentation      Follow up plan:  The care management team will reach out to the patient again over the next 30 days days. Will contact patient sooner if needed.   Ms. Svoboda was given information about Care Management services today including:  1. Care Management services include personalized support from designated clinical staff supervised by a physician, including individualized plan of care and coordination with other care providers 2. 24/7 contact phone numbers for assistance for urgent and routine care needs. 3. The patient may stop Care Management services at any time (effective at the end of the month) by phone call to the office staff.  Patient agreed to services and verbal consent obtained.  Casimer Lanius, LCSW Clinical Social Worker [place] / [place]   [phone] 2:46 PM

## 2019-04-05 ENCOUNTER — Ambulatory Visit: Payer: Medicare Other | Admitting: Orthotics

## 2019-04-06 ENCOUNTER — Other Ambulatory Visit: Payer: Self-pay

## 2019-04-06 ENCOUNTER — Ambulatory Visit (INDEPENDENT_AMBULATORY_CARE_PROVIDER_SITE_OTHER): Payer: Medicare Other | Admitting: Orthotics

## 2019-04-06 DIAGNOSIS — M21769 Unequal limb length (acquired), unspecified tibia and fibula: Secondary | ICD-10-CM

## 2019-04-06 DIAGNOSIS — M47816 Spondylosis without myelopathy or radiculopathy, lumbar region: Secondary | ICD-10-CM | POA: Diagnosis not present

## 2019-04-06 DIAGNOSIS — E0843 Diabetes mellitus due to underlying condition with diabetic autonomic (poly)neuropathy: Secondary | ICD-10-CM

## 2019-04-06 DIAGNOSIS — G5763 Lesion of plantar nerve, bilateral lower limbs: Secondary | ICD-10-CM | POA: Diagnosis not present

## 2019-04-06 NOTE — Progress Notes (Signed)

## 2019-04-12 ENCOUNTER — Other Ambulatory Visit: Payer: Self-pay | Admitting: Student in an Organized Health Care Education/Training Program

## 2019-04-12 ENCOUNTER — Other Ambulatory Visit: Payer: Self-pay | Admitting: *Deleted

## 2019-04-12 DIAGNOSIS — I10 Essential (primary) hypertension: Secondary | ICD-10-CM

## 2019-04-12 IMAGING — CR DG CHEST 2V
2 series · 2 of 2 positions shown · non-contrast
Comparison: Chest x-ray dated December 27, 2004.

CLINICAL DATA: Right-sided chest pain after fall.

EXAM:
CHEST - 2 VIEW

[chest pa]
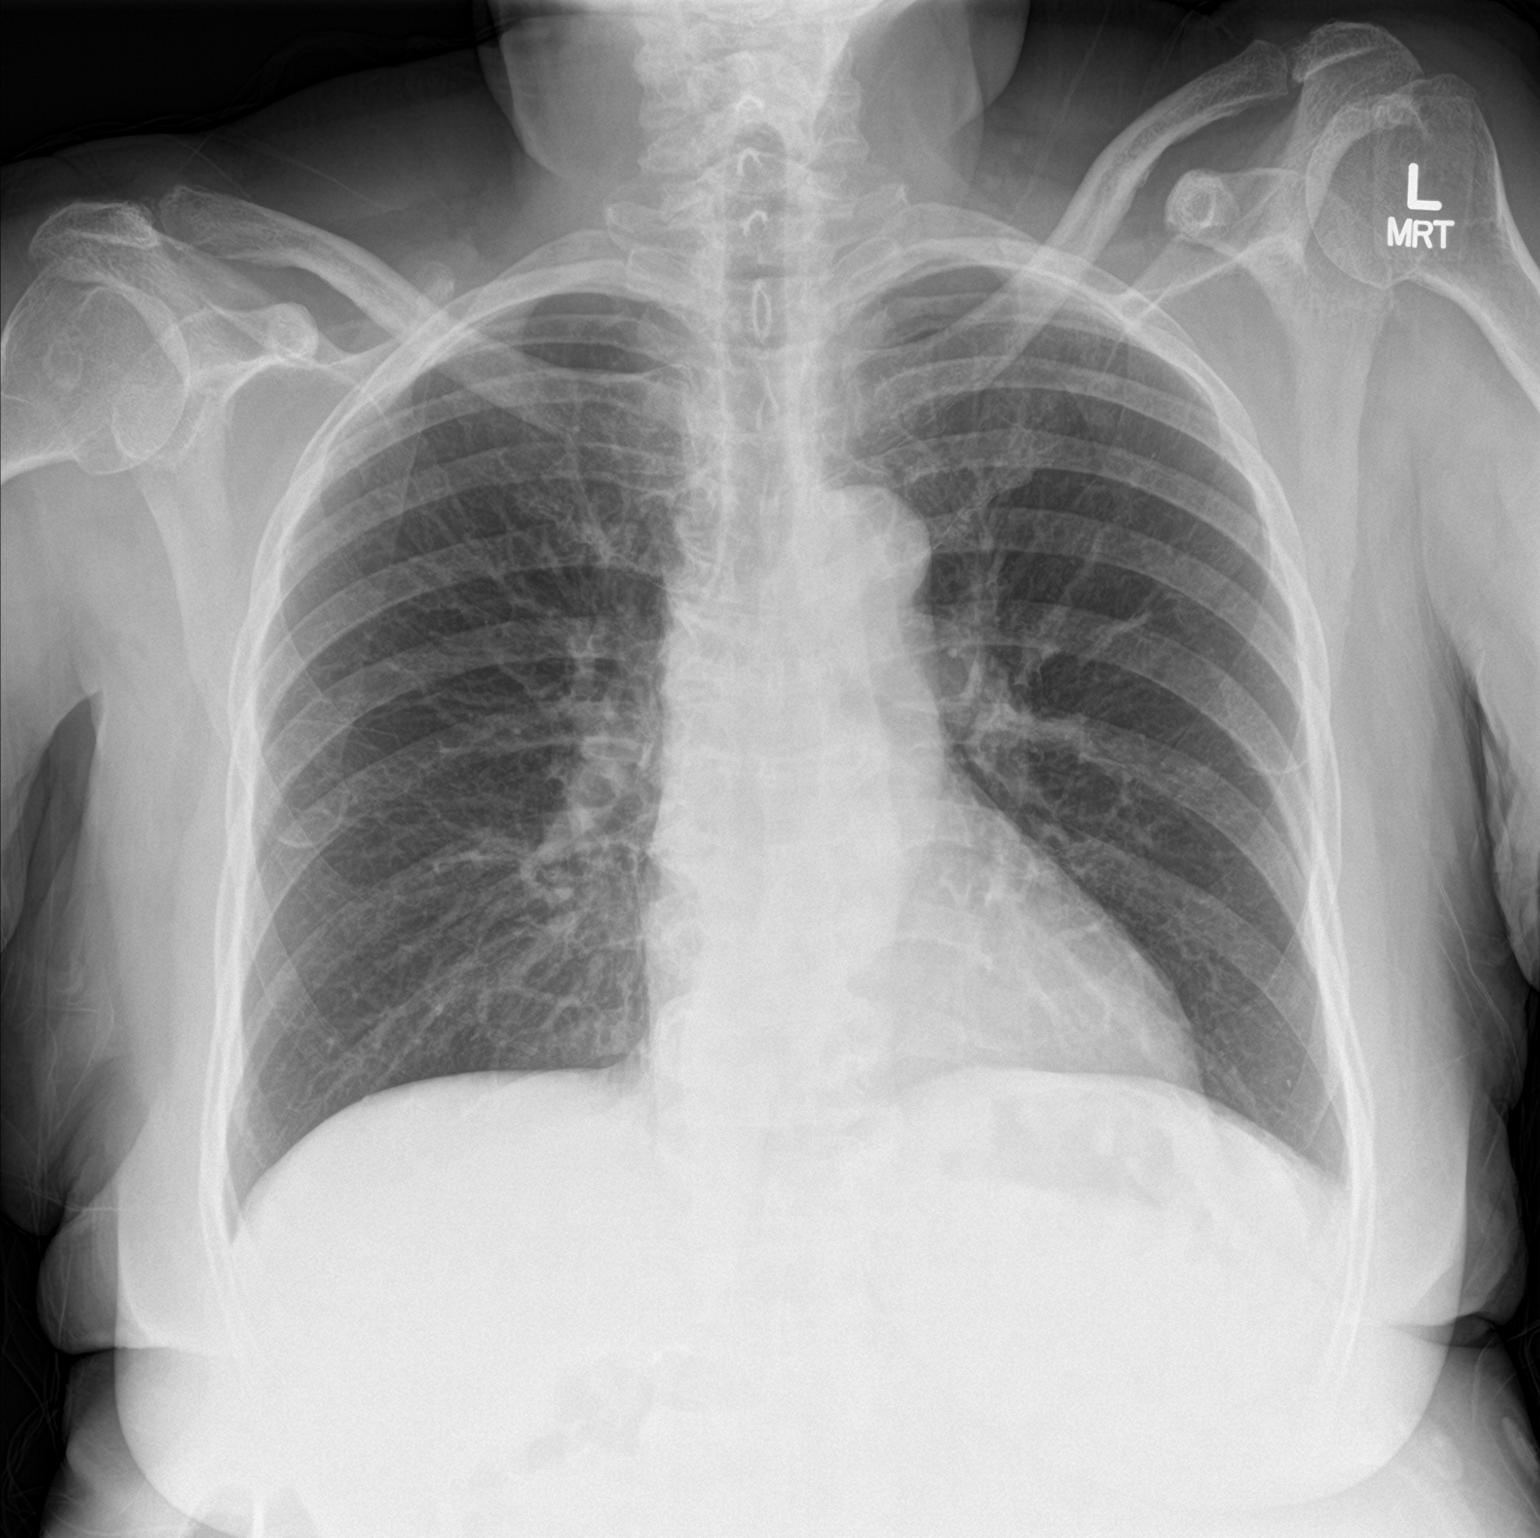

[chest lat]
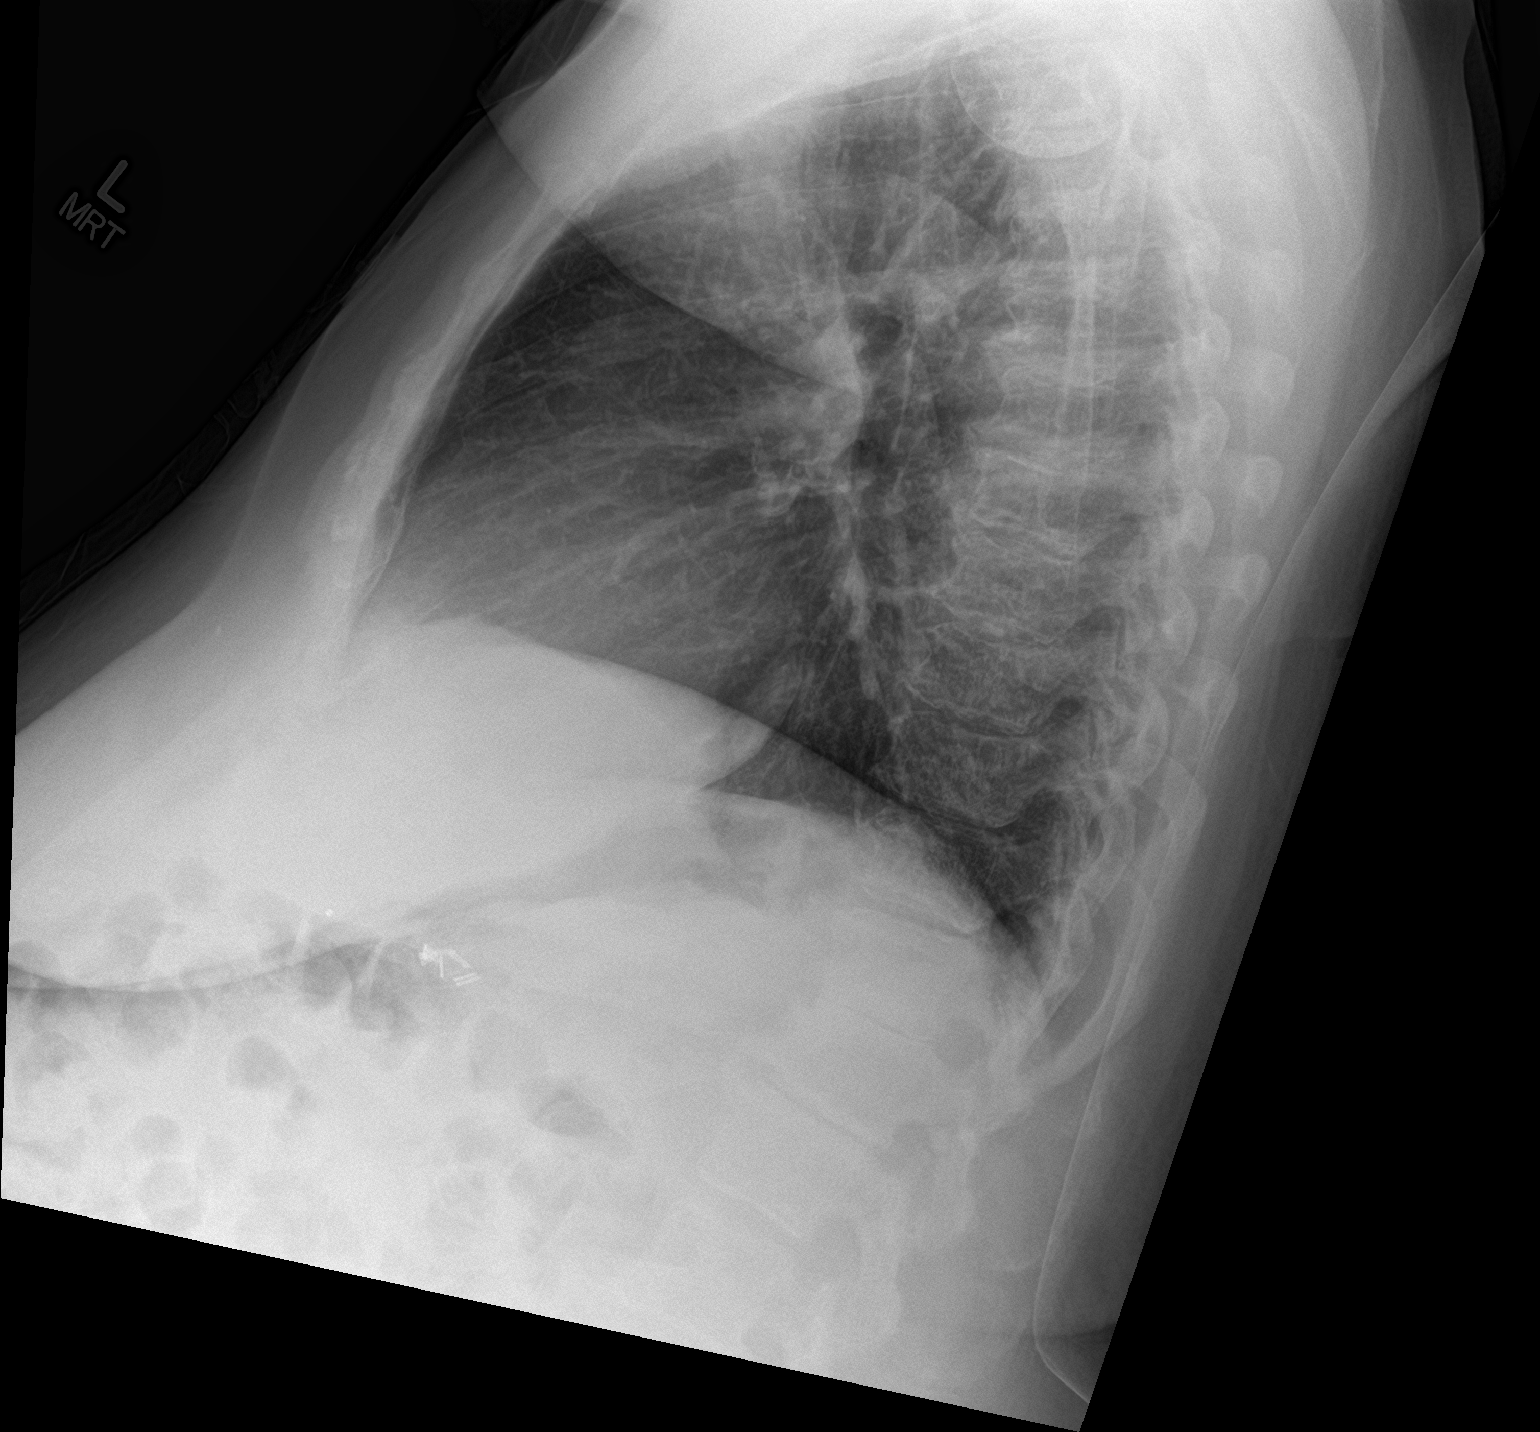

[2 of 2 positions shown; findings below may reference images not displayed]

FINDINGS: The heart size and mediastinal contours are within normal limits.
Normal pulmonary vascularity. No focal consolidation, pleural
effusion, or pneumothorax. No acute osseous abnormality.
Degenerative changes of the thoracic spine.
IMPRESSION: No active cardiopulmonary disease.

## 2019-04-14 MED ORDER — HYDROCHLOROTHIAZIDE 25 MG PO TABS: 25.0000 mg | ORAL_TABLET | Freq: Every day | ORAL | 3 refills | Status: DC

## 2019-04-16 ENCOUNTER — Other Ambulatory Visit: Payer: Self-pay

## 2019-04-16 ENCOUNTER — Other Ambulatory Visit: Payer: Self-pay | Admitting: Student in an Organized Health Care Education/Training Program

## 2019-04-16 DIAGNOSIS — K219 Gastro-esophageal reflux disease without esophagitis: Secondary | ICD-10-CM

## 2019-04-19 MED ORDER — ATORVASTATIN CALCIUM 40 MG PO TABS: ORAL_TABLET | ORAL | 3 refills | Status: DC

## 2019-04-19 MED ORDER — OMEPRAZOLE 20 MG PO CPDR: 20.0000 mg | DELAYED_RELEASE_CAPSULE | Freq: Two times a day (BID) | ORAL | 3 refills | Status: DC | PRN

## 2019-04-21 ENCOUNTER — Ambulatory Visit: Payer: Medicare Other | Admitting: Licensed Clinical Social Worker

## 2019-04-21 ENCOUNTER — Telehealth: Payer: Medicare Other

## 2019-04-21 DIAGNOSIS — Z7189 Other specified counseling: Secondary | ICD-10-CM

## 2019-04-21 DIAGNOSIS — I10 Essential (primary) hypertension: Secondary | ICD-10-CM

## 2019-04-21 DIAGNOSIS — E1142 Type 2 diabetes mellitus with diabetic polyneuropathy: Secondary | ICD-10-CM

## 2019-04-21 NOTE — Patient Instructions (Addendum)
Licensed Clinical Social Worker Visit Information [name]  it was nice speaking with you. Please call me directly if you have questions [phone] Goals we discussed today:  Goals Addressed            This Visit's Progress   . Advanced Care Planning   Not on track    Current Barriers:  . Patient with HTN, DMII, and Peripheral neuropathy does not have Advance Directives . Patient needs support, education, and care coordination in order to complete Advance Directive . Patient has not completed packet Clinical Social Work Clinical Goal(s):  Marland Kitchen Over the next 30 days, patient will verbalize basic understanding of Advanced Directives and importance of completion Interventions: . A voluntary discussion about advanced care planning including explanation and discussion of advanced directives was extentively discussed with the patient.   . Patient has received packet but has not completed it.  LCSW offered to assist patient when she is ready. Patient Self Care Activities:  . Is able to complete documentation independently . Is able to readily make contact with social support system . Unable to identify, power or attorney at this time but will continue to think about this . Initial goal documentation      . Client will have ADL needs addressed by [place]   Not on track    Current Barriers:   . Unmet personal care needs for patient with HTN, DMII, and Peripheral neuropathy. . Patient needs support, education, and care coordination needs in order to obtain [place] . Waiting on PCP to complete referral placed in [place] Work Goal(s): Over the next 30 days, patient will   . Select a personal care service provider  . Return calls from [facility] to set up initial PCS assessment once application is approved . Call [facility] with questions [phone] or [phone]  Interventions for PCS: . Continue to Collaborate with  primary care provider ref completing PCS referral . PCS referral will be faxed to [employer] at [phone] once completed and signed by PCP . LCSW will collaborate with [facility] to verify application is received and processed.  Patient Self Care Activities & Deficits:  . Perform ADL's with assistance from support system until [facility] is approved . Call provider office for new concerns or questions . Unable to perform ADLs independently without assistance  Please see past updates related to this goal by clicking on the "Past Updates" button in the selected goal       Materials provided:  [name] was given information about Care Management services today including:  1. Care Management services include personalized support from designated clinical staff supervised by her physician, including individualized plan of care and coordination with other care providers 2. 24/7 contact [phone] for assistance for urgent and routine care needs. 3. Care Management services at any time by phone call to the office staff. The patient verbalized understanding of instructions provided today and declined a print copy of patient instruction materials.  Follow up plan:  SW will follow up with patient by phone over the next 2 to 3 weeks  [name], LCSW

## 2019-04-21 NOTE — Chronic Care Management (AMB) (Signed)
  Social Work Care Management  Follow Up   04/21/2019 Name: [name] MRN: [id number] DOB: 1946/03/21  Referred by: Richarda Osmond, DO Reason for referral : Care Coordination (F/U on PCS)  [name] is a 74 y.o. year old female who is a primary care patient of Richarda Osmond, DO.  Reason for follow-up: phone encounter with patient today as she is concerned that she has not heard anything about her personal care services referral.   Review of patient status, including review of consultants reports, relevant laboratory and other test results, and collaboration with appropriate care team members and the patient's provider was performed as part of comprehensive patient evaluation and provision of chronic care management services.    SDOH (Social Determinants of Health) screening performed today: See Care Plan for related entries.  Advanced Directives: See Care Plan for related entries.  Goals Addressed            This Visit's Progress   . Advanced Care Planning   Not on track    Current Barriers:  . Patient with HTN, DMII, and Peripheral neuropathy does not have Advance Directives . Patient needs support, education, and care coordination in order to complete Advance Directive . Patient has not completed packet Clinical Social Work Clinical Goal(s):  Marland Kitchen Over the next 30 days, patient will verbalize basic understanding of Advanced Directives and importance of completion Interventions: . A voluntary discussion about advanced care planning including explanation and discussion of advanced directives was extentively discussed with the patient.   . Patient has received packet but has not completed it.  LCSW offered to assist patient when she is ready. Patient Self Care Activities:  . Is able to complete documentation independently . Is able to readily make contact with social support system . Unable to identify, power or attorney at this time but will continue to think  about this . Initial goal documentation      . Client will have ADL needs addressed by [place]   Not on track    Current Barriers:   . Unmet personal care needs for patient with HTN, DMII, and Peripheral neuropathy. . Patient needs support, education, and care coordination needs in order to obtain [place] . Waiting on PCP to complete referral placed in [place] Work Goal(s): Over the next 30 days, patient will   . Select a personal care service provider  . Return calls from [facility] to set up initial PCS assessment once application is approved . Call [facility] with questions [phone] or [phone]  Interventions for PCS: . Continue to Collaborate with primary care provider ref completing PCS referral . PCS referral will be faxed to [employer] at [phone] once completed and signed by PCP . LCSW will collaborate with [facility] to verify application is received and processed.  Patient Self Care Activities & Deficits:  . Perform ADL's with assistance from support system until [facility] is approved . Call provider office for new concerns or questions . Unable to perform ADLs independently without assistance  Please see past updates related to this goal by clicking on the "Past Updates" button in the selected goal        Plan: 1. LCSW will reach out to PCP again refer previous referral 2. LCSW will F/U with patient in 2 to 3 weeks [name], [place] / [place]   [phone] 3:14 PM SIGNATURE

## 2019-04-22 DIAGNOSIS — H401113 Primary open-angle glaucoma, right eye, severe stage: Secondary | ICD-10-CM | POA: Diagnosis not present

## 2019-04-22 DIAGNOSIS — H401121 Primary open-angle glaucoma, left eye, mild stage: Secondary | ICD-10-CM | POA: Diagnosis not present

## 2019-04-23 DIAGNOSIS — M4150 Other secondary scoliosis, site unspecified: Secondary | ICD-10-CM | POA: Diagnosis not present

## 2019-04-23 DIAGNOSIS — M47816 Spondylosis without myelopathy or radiculopathy, lumbar region: Secondary | ICD-10-CM | POA: Diagnosis not present

## 2019-04-26 DIAGNOSIS — Z1231 Encounter for screening mammogram for malignant neoplasm of breast: Secondary | ICD-10-CM | POA: Diagnosis not present

## 2019-04-28 ENCOUNTER — Telehealth: Payer: Medicare Other

## 2019-05-05 ENCOUNTER — Ambulatory Visit: Payer: Medicare Other | Admitting: Licensed Clinical Social Worker

## 2019-05-05 ENCOUNTER — Other Ambulatory Visit: Payer: Self-pay

## 2019-05-05 DIAGNOSIS — Z7189 Other specified counseling: Secondary | ICD-10-CM

## 2019-05-05 DIAGNOSIS — I10 Essential (primary) hypertension: Secondary | ICD-10-CM

## 2019-05-05 DIAGNOSIS — E1142 Type 2 diabetes mellitus with diabetic polyneuropathy: Secondary | ICD-10-CM

## 2019-05-05 NOTE — Chronic Care Management (AMB) (Signed)
Social Work Care Management  Follow Up    05/05/2019 Name: Coreena Tassy MRN: [id number] DOB: Sep 08, 1945  Referred by: Richarda Osmond, DO Reason for referral : Care Coordination (PCS F/U)  Sita Mcguiness is a 74 y.o. year old female who is a primary care patient of Richarda Osmond, DO.  Reason for follow-up: Phone encounter with patient today for ongoing assessment and brief interventions to assist with care coordination needs related to personal care services.  Patient reports she needs a rolling walker with a seat.  Also informed LCSW she took 1st Covid vaccine on Jan. 25th.   Patient is making progress with current goals.   SDOH (Social Determinants of Health) screening performed today:  See Care Plan for related entries.  Advanced Directives: See Care Plan for related entries.   Review of patient status, including review of consultants reports, relevant laboratory and other test results, and collaboration with appropriate care team members and the patient's provider was performed as part of comprehensive patient evaluation and provision of chronic care management services.   Goals Addressed            This Visit's Progress   . Advanced Care Planning   Not on track    Current Barriers:  . Patient with HTN, DMII, and Peripheral neuropathy does not have Advance Directives . Patient needs support, education, and care coordination in order to complete Advance Directive . Patient has review but has not completed packet Clinical Social Work Clinical Goal(s):  Marland Kitchen Over the next 30 days, patient will verbalize basic understanding of Advanced Directives and importance of completion, discuss with her daughter and niece. Interventions: . A voluntary discussion about advanced care planning including explanation and discussion of advanced directives was discussed with the patient.   . Patient has received packet but has not completed it.  . Patient Self Care Activities:  . Is able  to complete documentation independently . Is able to readily make contact with social support system . Unable to identify, power or attorney and will take to daughter and niece  . Please see past updates related to this goal by clicking on the "Past Updates" button in the selected goal        . Client will have ADL needs addressed by [place]   On track    Current Barriers:   . Unmet personal care needs for patient with HTN, DMII, and Peripheral neuropathy. Marland Kitchen PCS assessment is scheduled today at 3:00 Clinical Social Work Goal(s): Over the next 30 days, patient will   . Select a personal care service provider  . Return calls from [facility] to set up initial PCS assessment once application is approved . Call [facility] with questions [phone] or [phone]  Interventions for PCS: . Assess barriers to [facility] services . Discussed importance of selecting an agency to provide the service  Patient Self Care Activities & Deficits:  . Perform ADL's with assistance from support system until [facility] is approved . Call provider office for new concerns or questions . Unable to perform ADLs independently without assistance  Please see past updates related to this goal by clicking on the "Past Updates" button in the selected goal        Plan: Patient has a [facility] care Freight forwarder and no longer needs support and intervention from LCSW.  LCSW will inform PCP about patient's request for the walker. 1. Patient will call office if needed 2. No further follow-up required by  LCSW at this tim  Casimer Lanius, [place] / [place]   [phone] 2:00 PM

## 2019-05-05 NOTE — Patient Instructions (Signed)
Licensed Clinical Social Worker Visit Information Ms. Psomas  it was nice speaking with you. Please call me directly if you have questions [phone] Goals we discussed today:  Goals Addressed            This Visit's Progress   . Advanced Care Planning   Not on track    Current Barriers:  . Patient with HTN, DMII, and Peripheral neuropathy does not have Advance Directives . Patient needs support, education, and care coordination in order to complete Advance Directive . Patient has review but has not completed packet Clinical Social Work Clinical Goal(s):  Marland Kitchen Over the next 30 days, patient will verbalize basic understanding of Advanced Directives and importance of completion, discuss with her daughter and niece. Interventions: . A voluntary discussion about advanced care planning including explanation and discussion of advanced directives was discussed with the patient.   . Patient has received packet but has not completed it.  . Patient Self Care Activities:  . Is able to complete documentation independently . Is able to readily make contact with social support system . Unable to identify, power or attorney and will take to daughter and niece  . Please see past updates related to this goal by clicking on the "Past Updates" button in the selected goal        . Client will have ADL needs addressed by [place]   On track    Current Barriers:   . Unmet personal care needs for patient with HTN, DMII, and Peripheral neuropathy. Marland Kitchen PCS assessment is scheduled today at 3:00 Clinical Social Work Goal(s): Over the next 30 days, patient will   . Select a personal care service provider  . Return calls from [facility] to set up initial PCS assessment once application is approved . Call [facility] with questions [phone] or [phone]  Interventions for PCS: . Assess barriers to [facility] services . Discussed importance of selecting an agency to provide  the service  Patient Self Care Activities & Deficits:  . Perform ADL's with assistance from support system until [facility] is approved . Call provider office for new concerns or questions . Unable to perform ADLs independently without assistance  Please see past updates related to this goal by clicking on the "Past Updates" button in the selected goal         Materials provided: Ms. Steffensmeier was given information about Care Management services today including:  1. Care Management services include personalized support from designated clinical staff supervised by her physician, including individualized plan of care and coordination with other care providers 2. 24/7 contact [phone] for assistance for urgent and routine care needs. 3. Care Management services at any time by phone call to the office staff.  The patient verbalized understanding of instructions provided today and declined a print copy of patient instruction materials.  Follow up plan:  No follow up required at this time  [name], LCSW

## 2019-05-10 ENCOUNTER — Other Ambulatory Visit: Payer: Self-pay | Admitting: Student in an Organized Health Care Education/Training Program

## 2019-05-10 DIAGNOSIS — B91 Sequelae of poliomyelitis: Secondary | ICD-10-CM

## 2019-05-10 DIAGNOSIS — M6281 Muscle weakness (generalized): Secondary | ICD-10-CM

## 2019-05-10 NOTE — Progress Notes (Signed)
Prescribed rolling walker with seat as per patient request

## 2019-05-11 ENCOUNTER — Ambulatory Visit: Payer: Self-pay | Admitting: Licensed Clinical Social Worker

## 2019-05-11 DIAGNOSIS — I10 Essential (primary) hypertension: Secondary | ICD-10-CM

## 2019-05-11 DIAGNOSIS — E1142 Type 2 diabetes mellitus with diabetic polyneuropathy: Secondary | ICD-10-CM

## 2019-05-11 DIAGNOSIS — Z7189 Other specified counseling: Secondary | ICD-10-CM

## 2019-05-11 NOTE — Patient Instructions (Signed)
Licensed Clinical Social Worker Visit Information Ms. Gwozdz  it was nice speaking with you. Please call me directly if you have questions [phone] Goals we discussed today:  Goals Addressed            This Visit's Progress   . Advanced Care Planning   Not on track    Current Barriers:  . Patient with HTN, DMII, and Peripheral neuropathy does not have Advance Directives . Patient needs support, education, and care coordination in order to complete Advance Directive . Patient has review but has not completed packet Clinical Social Work Clinical Goal(s):  Marland Kitchen Over the next 30 days, patient will verbalize basic understanding of Advanced Directives and importance of completion, discuss with her daughter and niece. Interventions: . A voluntary discussion about advanced care planning including explanation and discussion of advanced directives was discussed with the patient.   . Patient has received packet but has not completed it.  . Patient Self Care Activities:  . Is able to complete documentation independently . Is able to readily make contact with social support system . Unable to identify, power or attorney and will take to daughter and niece  . Please see past updates related to this goal by clicking on the "Past Updates" button in the selected goal       . Client will have ADL needs addressed by [place]   On track    Current Barriers:   . Unmet personal care needs for patient with HTN, DMII, and Peripheral neuropathy. Marland Kitchen PCS assessment is complete Clinical Social Work Goal(s): Over the next 30 days, patient will   . Return calls from [facility] to set up initial PCS assessment once application is approved . Call [facility] with questions [phone] or [phone]  Interventions for PCS: . Assess barriers to [facility] services Patient Self Care Activities & Deficits:  . Perform ADL's with assistance from support system until [facility] is  approved . Call provider office for new concerns or questions . Unable to perform ADLs independently without assistance  Please see past updates related to this goal by clicking on the "Past Updates" button in the selected goal      . Need rolling walker       Current Barriers:  . support, and care coordination needs related to obtaining DME for patient with HTN and DMII  . Need PCP to write Rx Clinical Goal(s)  . Over the next 30 days, patient will pick up Rx from [facility] office and follow up with [place] to pick up her rolling walker  Interventions provided by LCSW:  . Assessed patient's care coordination needs  . Advised patient PCP left Rx at front desk for her to pick . Advised patient to take Rx to Medical store  Patient Self Care Activities & Deficits:  . Patient is unable to independently navigate getting DME without care coordination support  . Acknowledges deficits related to obtaining tolling walker and is motivated to resolve concern  . Patient is able to pick up Rx left at front desk as discussed today Initial goal documentation      Materials provided:  Ms. Hefter was given information about Care Management services today including:  1. Care Management services include personalized support from designated clinical staff supervised by her physician, including individualized plan of care and coordination with other care providers 2. 24/7 contact [phone] for assistance for urgent and routine care needs. 3. Care Management services at any  time by phone call to the office staff. The patient verbalized understanding of instructions provided today and declined a print copy of patient instruction materials.  Follow up plan: follow up in 4 weeks  [name], LCSW

## 2019-05-11 NOTE — Chronic Care Management (AMB) (Signed)
Social Work Care Management  Follow Up   05/11/2019 Name: [name] MRN: [id number] DOB: 10-29-1945  Referred by: Richarda Osmond, DO Reason for referral : Care Coordination (walker)  [name] is a 74 y.o. year old female who is a primary care patient of Richarda Osmond, DO.  Reason for follow-up: Phone encounter with patient today for ongoing assessment and brief interventions to assist with care coordination needs related to obtaining a rolling walker.   Assessment: Patient completed PCS assessment and anticipates services starting in about two weeks.   Intervention: SDOH (Social Determinants of Health) screening and general assessment of needs completed today. No new needs identified today. See Care Plan for related entries.  Advanced Directives: See Care Plan  for related entries.   Review of patient status, including review of consultants reports, relevant laboratory and other test results, and collaboration with appropriate care team members and the patient's provider was performed as part of comprehensive patient evaluation and provision of chronic care management services.   Outpatient Encounter Medications as of 05/11/2019  Medication Sig  . aspirin 81 MG tablet Take 1 tablet (81 mg total) by mouth daily.  Marland Kitchen atorvastatin (LIPITOR) 40 MG tablet TAKE 1 TABLET (40 MG TOTAL) BY MOUTH DAILY.  . baclofen (LIORESAL) 10 MG tablet TAKE 1/2 TABLET 2 TIMES A DAY AS NEEDED FOR MUSCLE SPASM  . brimonidine-timolol (COMBIGAN) 0.2-0.5 % ophthalmic solution Place 1 drop into both eyes every 12 (twelve) hours. 1 drop each eye every 12 hours  . calcium carbonate (OS-CAL) 600 MG TABS Take 1 tablet (600 mg total) by mouth daily.  . cholecalciferol (VITAMIN D) 1000 UNITS tablet Take 1 tablet (1,000 Units total) by mouth daily.  . dorzolamide (TRUSOPT) 2 % ophthalmic solution Place 1 drop into the right eye 2 (two) times daily.  . dorzolamide (TRUSOPT) 2 % ophthalmic solution   .  gabapentin (NEURONTIN) 400 MG capsule TAKE 1 CAPSULE (400 MG TOTAL) BY MOUTH AT BEDTIME.  Marland Kitchen gabapentin (NEURONTIN) 600 MG tablet Take 1 tablet (600 mg total) by mouth at bedtime.  . hydrochlorothiazide (HYDRODIURIL) 25 MG tablet Take 1 tablet (25 mg total) by mouth daily.  Marland Kitchen HYDROcodone-acetaminophen (NORCO) 10-325 MG tablet TAKE 1 TABLET BY ORAL ROUTE EVERY 8 HOURS AS NEEDED FOR PAIN, 30 DAY SUPPLY  . latanoprost (XALATAN) 0.005 % ophthalmic solution Place 1 drop into both eyes at bedtime.  Marland Kitchen losartan (COZAAR) 25 MG tablet Take 1 tablet (25 mg total) by mouth at bedtime.  . metFORMIN (GLUCOPHAGE) 500 MG tablet TAKE 1 TABLET TWICE A DAY WITH A MEAL  . Multiple Vitamin (MULTIVITAMIN WITH MINERALS) TABS tablet Take 1 tablet by mouth daily.  Marland Kitchen omeprazole (PRILOSEC) 20 MG capsule Take 1 capsule (20 mg total) by mouth 2 (two) times daily as needed.  Marland Kitchen RHOPRESSA 0.02 % SOLN   . VYZULTA 0.024 % SOLN    Facility-Administered Encounter Medications as of 05/11/2019  Medication  . 0.9 %  sodium chloride infusion   Goals Addressed            This Visit's Progress   . Advanced Care Planning   Not on track    Current Barriers:  . Patient with HTN, DMII, and Peripheral neuropathy does not have Advance Directives . Patient needs support, education, and care coordination in order to complete Advance Directive . Patient has review but has not completed packet Clinical Social Work Clinical Goal(s):  Marland Kitchen Over the next 30 days, patient will verbalize basic  understanding of Advanced Directives and importance of completion, discuss with her daughter and niece. Interventions: . A voluntary discussion about advanced care planning including explanation and discussion of advanced directives was discussed with the patient.   . Patient has received packet but has not completed it.  . Patient Self Care Activities:  . Is able to complete documentation independently . Is able to readily make contact with social  support system . Unable to identify, power or attorney and will take to daughter and niece  . Please see past updates related to this goal by clicking on the "Past Updates" button in the selected goal       . Client will have ADL needs addressed by [place]   On track    Current Barriers:   . Unmet personal care needs for patient with HTN, DMII, and Peripheral neuropathy. PCS assessment is complete Clinical Social Work Goal(s): Over the next 30 days, patient will   . Wait for services to start . Return calls from [facility] to set up initial PCS assessment once application is approved . Call [facility] with questions [phone] or [phone]  Interventions for PCS: . Assess barriers to [facility] services none identified Patient Self Care Activities & Deficits:  . Perform ADL's with assistance from support system until [facility] is approved . Call provider office for new concerns or questions . Unable to perform ADLs independently without assistance  Please see past updates related to this goal by clicking on the "Past Updates" button in the selected goal      . Need rolling walker       Current Barriers:  . support, and care coordination needs related to obtaining DME for patient with HTN and DMII  . Need PCP to write Rx Clinical Goal(s)  . Over the next 30 days, patient will pick up Rx from [facility] office and follow up with [place] to pick up her rolling walker  Interventions provided by LCSW:  . Assessed patient's care coordination needs  . Advised patient PCP left Rx at front desk for her to pick . Advised patient to take Rx to Medical store Patient Self Care Activities & Deficits:  . Patient is unable to independently navigate getting DME without care coordination support  . Acknowledges deficits related to obtaining tolling walker and is motivated to resolve concern  . Patient is able to pick up Rx left at front desk as discussed  today  Initial goal documentation     Plan:  1. Patient will pick up Rx from [facility] front office and take to medical supply store 2. LCSW will follow up with patient in 4 weeks  Casimer Lanius, [place] / [place]   [phone] 2:45 PM

## 2019-05-12 DIAGNOSIS — G629 Polyneuropathy, unspecified: Secondary | ICD-10-CM | POA: Diagnosis not present

## 2019-06-01 DIAGNOSIS — E1140 Type 2 diabetes mellitus with diabetic neuropathy, unspecified: Secondary | ICD-10-CM | POA: Diagnosis not present

## 2019-06-09 ENCOUNTER — Other Ambulatory Visit: Payer: Self-pay

## 2019-06-09 ENCOUNTER — Ambulatory Visit: Payer: Medicare Other | Admitting: Licensed Clinical Social Worker

## 2019-06-09 DIAGNOSIS — Z7189 Other specified counseling: Secondary | ICD-10-CM

## 2019-06-09 DIAGNOSIS — Z139 Encounter for screening, unspecified: Secondary | ICD-10-CM

## 2019-06-09 NOTE — Chronic Care Management (AMB) (Signed)
Care Management   Clinical Social Work Follow Up   06/09/2019 Name: [name] MRN: [id number] DOB: 10/01/1945  Referred by: Richarda Osmond, DO  Reason for referral : Care Coordination (1 month check in  )  [name] is a 74 y.o. year old female who is a primary care patient of Richarda Osmond, DO.  Reason for follow-up: Phone encounter with patient today for ongoing assessment and brief interventions to assist with care coordination needs.   Assessment: Patient is making progress towards goal.  This is also evident by [facility] aide starting as well as receiving the rolling walker . Patient's concern during this encounter is that PCP has decreased her Gabapentin and she is unable to sleep at night. ( Patient will contact office and discuss this concern with PCP to see if she is able to increase the dose).  Advance Directive Status: N See Care Plan for related entries. SDOH (Social Determinants of Health) assessments performed: Yes, however no needs identified.  Review of patient status, including review of consultants reports, relevant laboratory and other test results, and collaboration with appropriate care team members and the patient's provider was performed as part of comprehensive patient evaluation and provision of care management services.    Goals Addressed            This Visit's Progress   . Advanced Care Planning   Not on track     [place] (see longtitudinal plan of care for additional care plan information)  Current Barriers:  . Patient with HTN, DMII, and Peripheral neuropathy does not have Advance Directives . Patient needs support, education, and care coordination in order to complete Advance Directive . Patient has review information but has not completed packet . Not ready to work on packet at this time would like to revisit in 2 to 3 weeks Clinical Social Work Clinical Goal(s):  Marland Kitchen Over the next 30 days, patient will verbalize basic  understanding of Advanced Directives and importance of completion, discuss with her daughter and niece. Interventions: . A voluntary discussion about advanced care planning including explanation and discussion of advanced directives was discussed with the patient.   Patient Self Care Activities:  . Is able to complete documentation independently however would like LCSW to assist   . Is able to readily make contact with social support system . Unable to identify, power or attorney and will talk to daughter and niece  . Please see past updates related to this goal by clicking on the "Past Updates" button in the selected goal      . COMPLETED: Client will have ADL needs addressed by Personal Care Serivice Aide       Current Barriers:   . Unmet personal care needs for patient with HTN, DMII, and Peripheral neuropathy. Marland Kitchen PCS assessment is complete . Aide has started and everything is going well Clinical Social Work Goal(s): Over the next 30 days, patient will   . Return calls from [facility] to set up initial PCS assessment once application is approved . Call [facility] with questions [phone] or [phone]  Interventions for PCS: . Assess barriers to [facility] services Patient Self Care Activities & Deficits:  . Perform ADL's with assistance from support system until [facility] is approved . Call provider office for new concerns or questions . Unable to perform ADLs independently without assistance  Please see past updates related to this goal by clicking on the "Past Updates" button in the selected goal      .  COMPLETED: Need rolling walker   On track    Current Barriers:  . support, and care coordination needs related to obtaining DME for patient with HTN and DMII  . Need PCP to write Rx . Patient picked up Rx and has walker. Clinical Goal(s)  . Over the next 30 days, patient will pick up Rx from [facility] office and follow up with [place] to pick up her rolling  walker  Interventions provided by LCSW:  . Assessed patient's care coordination needs  . Advised patient PCP left Rx at front desk for her to pick . Advised patient to take Rx to Medical store Patient Self Care Activities & Deficits:  . Patient is unable to independently navigate getting DME without care coordination support  . Acknowledges deficits related to obtaining tolling walker and is motivated to resolve concern  . Patient is able to pick up Rx left at front desk as discussed today Please see past updates related to this goal by clicking on the "Past Updates" button in the selected goal        Outpatient Encounter Medications as of 06/09/2019  Medication Sig  . aspirin 81 MG tablet Take 1 tablet (81 mg total) by mouth daily.  Marland Kitchen atorvastatin (LIPITOR) 40 MG tablet TAKE 1 TABLET (40 MG TOTAL) BY MOUTH DAILY.  . baclofen (LIORESAL) 10 MG tablet TAKE 1/2 TABLET 2 TIMES A DAY AS NEEDED FOR MUSCLE SPASM  . brimonidine-timolol (COMBIGAN) 0.2-0.5 % ophthalmic solution Place 1 drop into both eyes every 12 (twelve) hours. 1 drop each eye every 12 hours  . calcium carbonate (OS-CAL) 600 MG TABS Take 1 tablet (600 mg total) by mouth daily.  . cholecalciferol (VITAMIN D) 1000 UNITS tablet Take 1 tablet (1,000 Units total) by mouth daily.  . dorzolamide (TRUSOPT) 2 % ophthalmic solution Place 1 drop into the right eye 2 (two) times daily.  . dorzolamide (TRUSOPT) 2 % ophthalmic solution   . gabapentin (NEURONTIN) 400 MG capsule TAKE 1 CAPSULE (400 MG TOTAL) BY MOUTH AT BEDTIME.  Marland Kitchen gabapentin (NEURONTIN) 600 MG tablet Take 1 tablet (600 mg total) by mouth at bedtime.  . hydrochlorothiazide (HYDRODIURIL) 25 MG tablet Take 1 tablet (25 mg total) by mouth daily.  Marland Kitchen HYDROcodone-acetaminophen (NORCO) 10-325 MG tablet TAKE 1 TABLET BY ORAL ROUTE EVERY 8 HOURS AS NEEDED FOR PAIN, 30 DAY SUPPLY  . latanoprost (XALATAN) 0.005 % ophthalmic solution Place 1 drop into both eyes at bedtime.  Marland Kitchen losartan (COZAAR)  25 MG tablet Take 1 tablet (25 mg total) by mouth at bedtime.  . metFORMIN (GLUCOPHAGE) 500 MG tablet TAKE 1 TABLET TWICE A DAY WITH A MEAL  . Multiple Vitamin (MULTIVITAMIN WITH MINERALS) TABS tablet Take 1 tablet by mouth daily.  Marland Kitchen omeprazole (PRILOSEC) 20 MG capsule Take 1 capsule (20 mg total) by mouth 2 (two) times daily as needed.  Marland Kitchen RHOPRESSA 0.02 % SOLN   . VYZULTA 0.024 % SOLN    Facility-Administered Encounter Medications as of 06/09/2019  Medication  . 0.9 %  sodium chloride infusion   Plan: LCSW will F/U with patient in 3 weeks to review advance directives   Casimer Lanius, [place] / [place]   [phone] 11:40 AM

## 2019-06-09 NOTE — Patient Instructions (Signed)
Licensed Clinical Social Worker Visit Information Ms. Wi  it was nice speaking with you. Please call me directly if you have questions [phone] Goals we discussed today:  Goals Addressed            This Visit's Progress   . Advanced Care Planning   Not on track     [place] (see longtitudinal plan of care for additional care plan information)  Current Barriers:  . Patient with HTN, DMII, and Peripheral neuropathy does not have Advance Directives . Patient needs support, education, and care coordination in order to complete Advance Directive . Patient has review information but has not completed packet . Not ready to work on packet at this time would like to revisit in 2 to 3 weeks Clinical Social Work Clinical Goal(s):  Marland Kitchen Over the next 30 days, patient will verbalize basic understanding of Advanced Directives and importance of completion, discuss with her daughter and niece. Interventions: . A voluntary discussion about advanced care planning including explanation and discussion of advanced directives was discussed with the patient.   Patient Self Care Activities:  . Is able to complete documentation independently however would like LCSW to assist   . Is able to readily make contact with social support system . Unable to identify, power or attorney and will talk to daughter and niece  . Please see past updates related to this goal by clicking on the "Past Updates" button in the selected goal      . COMPLETED: Client will have ADL needs addressed by Personal Care Serivice Aide       Current Barriers:   . Unmet personal care needs for patient with HTN, DMII, and Peripheral neuropathy. Marland Kitchen PCS assessment is complete . Aide has started and everything is going well Clinical Social Work Goal(s): Over the next 30 days, patient will   . Return calls from [facility] to set up initial PCS assessment once application is approved . Call [facility] with  questions [phone] or [phone]  Interventions for PCS: . Assess barriers to [facility] services Patient Self Care Activities & Deficits:  . Perform ADL's with assistance from support system until [facility] is approved . Call provider office for new concerns or questions . Unable to perform ADLs independently without assistance  Please see past updates related to this goal by clicking on the "Past Updates" button in the selected goal      . COMPLETED: Need rolling walker   On track    Current Barriers:  . support, and care coordination needs related to obtaining DME for patient with HTN and DMII  . Need PCP to write Rx . Patient picked up Rx and has walker. Clinical Goal(s)  . Over the next 30 days, patient will pick up Rx from [facility] office and follow up with [place] to pick up her rolling walker  Interventions provided by LCSW:  . Assessed patient's care coordination needs  . Advised patient PCP left Rx at front desk for her to pick . Advised patient to take Rx to Medical store Patient Self Care Activities & Deficits:  . Patient is unable to independently navigate getting DME without care coordination support  . Acknowledges deficits related to obtaining tolling walker and is motivated to resolve concern  . Patient is able to pick up Rx left at front desk as discussed today Please see past updates related to this goal by clicking on the "Past Updates" button in the selected goal  Materials provided: Verbal education about Advance Directives provided by phone Ms. Ramnath received Care Management services today:  1. Care Management services include personalized support from designated clinical staff supervised by her physician, including individualized plan of care and coordination with other care providers 2. 24/7 contact [phone] for assistance for urgent and routine care needs. 3. Care Management are voluntary services and be declined at any time by calling the  office.  Patient verbally agreed to assistance and services provided by embedded care coordination/care management team today.   Patient verbalizes understanding of instructions provided today.  Follow up plan:  SW will follow up with patient by phone over the next 2 t o3 weeks  [name], LCSW

## 2019-06-11 ENCOUNTER — Other Ambulatory Visit: Payer: Self-pay | Admitting: Student in an Organized Health Care Education/Training Program

## 2019-06-11 ENCOUNTER — Telehealth: Payer: Self-pay

## 2019-06-11 NOTE — Telephone Encounter (Signed)
Patient calls nurse line stating she would like to know if she can add Gabapentin 400mg  to her day. Patient is taking 600mg  at bedtime, however she is finding this is no longer helping. Patient stated she would like to add 400mg  to take during the day. Please advise.

## 2019-07-02 ENCOUNTER — Telehealth: Payer: Medicare Other

## 2019-07-05 DIAGNOSIS — M47816 Spondylosis without myelopathy or radiculopathy, lumbar region: Secondary | ICD-10-CM | POA: Diagnosis not present

## 2019-07-06 ENCOUNTER — Telehealth: Payer: Self-pay | Admitting: Student in an Organized Health Care Education/Training Program

## 2019-07-06 ENCOUNTER — Other Ambulatory Visit: Payer: Self-pay

## 2019-07-06 ENCOUNTER — Ambulatory Visit: Payer: Medicare Other | Admitting: Licensed Clinical Social Worker

## 2019-07-06 DIAGNOSIS — Z7189 Other specified counseling: Secondary | ICD-10-CM

## 2019-07-06 NOTE — Patient Instructions (Signed)
Licensed Clinical Social Worker Visit Information Ms. Kanaan  it was nice speaking with you. Please call me directly if you have questions [phone] Goals we discussed today:  Goals Addressed            This Visit's Progress   . Advanced Care Planning   Not on track     [place] (see longtitudinal plan of care for additional care plan information)  Current Barriers & progress:  . Patient with HTN, DMII, and Peripheral neuropathy does not have Advance Directives . Patient needs support, education, and care coordination in order to complete Advance Directive . Patient has review information but has not completed packet . States she knows she needs to complete but has questions Clinical Social Work Clinical Goal(s):  Marland Kitchen Over the next 30 days, patient will verbalize basic understanding of Advanced Directives and importance of completion, discuss with her daughter and niece. Interventions: . A voluntary discussion about advanced care planning including explanation and discussion of advanced directives was discussed with the patient.   . Assessed barriers Patient Self Care Activities:  . Is able to complete documentation independently however would like LCSW to assist   . Unable to identify, power or attorney and will talk to daughter and niece  . Please see past updates related to this goal by clicking on the "Past Updates" button in the selected goal        Materials provided: Verbal education about advance directives provided by phone Ms. Stjulien received Care Management services today:  1. Care Management services include personalized support from designated clinical staff supervised by her physician, including individualized plan of care and coordination with other care providers 2. 24/7 contact [phone] for assistance for urgent and routine care needs. 3. Care Management are voluntary services and be declined at any time by calling the office.  Patient  verbally  agreed to assistance and services provided by embedded care coordination/care management team today.   Patient verbalizes understanding of instructions provided today.  Follow up plan:  SW will follow up with patient by phone over the next week  [name], LCSW

## 2019-07-06 NOTE — Chronic Care Management (AMB) (Signed)
Care Management   Clinical Social Work Follow Up   07/06/2019 Name: [name] MRN: [id number] DOB: 05/02/1945  Referred by: Richarda Osmond, DO  Reason for referral : Care Coordination (Advance directives)  [name] is a 74 y.o. year old female who is a primary care patient of Richarda Osmond, DO.  Reason for follow-up: Phone encounter with patient today for ongoing assessment and brief interventions to assist with care coordination needs.    Assessment: Patient continues to experience difficulty with concerns related to her gabapentine which is causing problems with not being able to sleep.  Patient has called nursed line and has been waiting for return call from PCP.       Review of patient status, including review of consultants reports, relevant laboratory and other test results, and collaboration with appropriate care team members and the patient's provider was performed as part of comprehensive patient evaluation and provision of care management services.    Advance Directive Status: N See Care Plan  for related entries. SDOH (Social Determinants of Health) assessments performed: Yes  No needs identified Goals Addressed            This Visit's Progress   . Advanced Care Planning   Not on track     [place] (see longtitudinal plan of care for additional care plan information)  Current Barriers & progress:  . Patient with HTN, DMII, and Peripheral neuropathy does not have Advance Directives . Patient needs support, education, and care coordination in order to complete Advance Directive . Patient has review information but has not completed packet . States she knows she needs to complete but has questions Clinical Social Work Clinical Goal(s):  Marland Kitchen Over the next 30 days, patient will verbalize basic understanding of Advanced Directives and importance of completion, discuss with her daughter and niece. Interventions: . A voluntary discussion about  advanced care planning including explanation and discussion of advanced directives was discussed with the patient.   . Assessed barriers with completing forms, patient would like to schedule time next week to review with LCSW Patient Self Care Activities:  . Is able to complete documentation independently however would like LCSW to assist   . Unable to identify, power or attorney and will talk to daughter and niece  . Please see past updates related to this goal by clicking on the "Past Updates" button in the selected goal         Outpatient Encounter Medications as of 07/06/2019  Medication Sig  . aspirin 81 MG tablet Take 1 tablet (81 mg total) by mouth daily.  Marland Kitchen atorvastatin (LIPITOR) 40 MG tablet TAKE 1 TABLET (40 MG TOTAL) BY MOUTH DAILY.  . baclofen (LIORESAL) 10 MG tablet TAKE 1/2 TABLET 2 TIMES A DAY AS NEEDED FOR MUSCLE SPASM  . brimonidine-timolol (COMBIGAN) 0.2-0.5 % ophthalmic solution Place 1 drop into both eyes every 12 (twelve) hours. 1 drop each eye every 12 hours  . calcium carbonate (OS-CAL) 600 MG TABS Take 1 tablet (600 mg total) by mouth daily.  . cholecalciferol (VITAMIN D) 1000 UNITS tablet Take 1 tablet (1,000 Units total) by mouth daily.  . dorzolamide (TRUSOPT) 2 % ophthalmic solution Place 1 drop into the right eye 2 (two) times daily.  . dorzolamide (TRUSOPT) 2 % ophthalmic solution   . gabapentin (NEURONTIN) 400 MG capsule TAKE 1 CAPSULE (400 MG TOTAL) BY MOUTH AT BEDTIME.  Marland Kitchen gabapentin (NEURONTIN) 600 MG tablet TAKE 1 TABLET (600 MG TOTAL) BY MOUTH  AT BEDTIME.  . hydrochlorothiazide (HYDRODIURIL) 25 MG tablet Take 1 tablet (25 mg total) by mouth daily.  Marland Kitchen HYDROcodone-acetaminophen (NORCO) 10-325 MG tablet TAKE 1 TABLET BY ORAL ROUTE EVERY 8 HOURS AS NEEDED FOR PAIN, 30 DAY SUPPLY  . latanoprost (XALATAN) 0.005 % ophthalmic solution Place 1 drop into both eyes at bedtime.  Marland Kitchen losartan (COZAAR) 25 MG tablet Take 1 tablet (25 mg total) by mouth at bedtime.  .  metFORMIN (GLUCOPHAGE) 500 MG tablet TAKE 1 TABLET TWICE A DAY WITH A MEAL  . Multiple Vitamin (MULTIVITAMIN WITH MINERALS) TABS tablet Take 1 tablet by mouth daily.  Marland Kitchen omeprazole (PRILOSEC) 20 MG capsule Take 1 capsule (20 mg total) by mouth 2 (two) times daily as needed.  Marland Kitchen RHOPRESSA 0.02 % SOLN   . VYZULTA 0.024 % SOLN    Facility-Administered Encounter Medications as of 07/06/2019  Medication  . 0.9 %  sodium chloride infusion   Plan:  1. LCSW advised patient to schedule virtual visit with PCP to discuss concerns with Medication 2. F/U phone call scheduled with patient to review advance directive information   Casimer Lanius, [place] / [place]   [phone] 10:45 AM

## 2019-07-06 NOTE — Telephone Encounter (Signed)
Called patient concerning request for medication.  Patient states that she needs to increase her Gabapentin to control her leg pain.  Patient made virtual appointment for 07/07/2019 with Dr. Kris Mouton as PCP was not available.  Ozella Almond, [place]

## 2019-07-06 NOTE — Telephone Encounter (Signed)
Pt called regarding her medication Left message about three weeks ago and no one called  Ph # [phone]

## 2019-07-07 ENCOUNTER — Telehealth (INDEPENDENT_AMBULATORY_CARE_PROVIDER_SITE_OTHER): Payer: Medicare Other | Admitting: Family Medicine

## 2019-07-07 ENCOUNTER — Other Ambulatory Visit: Payer: Self-pay

## 2019-07-07 DIAGNOSIS — G629 Polyneuropathy, unspecified: Secondary | ICD-10-CM | POA: Diagnosis not present

## 2019-07-07 DIAGNOSIS — G6281 Critical illness polyneuropathy: Secondary | ICD-10-CM

## 2019-07-07 MED ORDER — GABAPENTIN 600 MG PO TABS: 600.0000 mg | ORAL_TABLET | Freq: Three times a day (TID) | ORAL | 0 refills | Status: DC

## 2019-07-09 NOTE — Progress Notes (Signed)
[place] Telemedicine Visit  Patient consented to have virtual visit. Method of visit: Telephone  Encounter participants: Patient: [name] - located at home Provider: Guadalupe Dawn [facility]  Chief Complaint: leg pain, discuss gabapentin dosing  HPI: 74 year old female who presents for medication management. Patient has a history of polio and has chronic leg pain from that issue. She was originally on 800mg  bid gabapentin which was managing her symptoms well per her report. She was dropped down to 600mg  daily. From charting it looks like the patient was instructed to increase her gabapentin and was being tapered off of lyrica. I do not see lyrica on her medication list and the patient states that she is not taking it. It looks like she did not increase the gabapentin as instructed based on charting, although it seems as though there is some confusion regarding her plan.  Since that time she has been having increased pain. She feels that her 600mg  dosing does work pretty well, however she feels that it wears off and she isn't getting good relief.  ROS: per HPI  Pertinent PMHx: polio in childhood  Exam:  General: no acute distress, comfortable Resp: able to speak in clear coherent sentences, no wheezing, no distress  Assessment/Plan:  Peripheral neuropathy Increase gabapentin to 600mg  bid, can follow up with pcp as needed for further adjustment.    Time spent during visit with patient: 13 minutes

## 2019-07-09 NOTE — Assessment & Plan Note (Signed)
Increase gabapentin to 600mg  bid, can follow up with pcp as needed for further adjustment.

## 2019-07-14 ENCOUNTER — Ambulatory Visit: Payer: Medicare Other | Admitting: Licensed Clinical Social Worker

## 2019-07-14 ENCOUNTER — Other Ambulatory Visit: Payer: Self-pay

## 2019-07-14 DIAGNOSIS — Z7189 Other specified counseling: Secondary | ICD-10-CM

## 2019-07-14 NOTE — Patient Instructions (Signed)
Licensed Clinical Social Worker Visit Information [name]  it was nice speaking with you. Please call me directly if you have questions [phone] Goals we discussed today: completing advance directives Goals Addressed            This Visit's Progress   . Advanced Care Planning   Not on track     [name] (see longtitudinal plan of care for additional care plan information)  Current Barriers & progress:  . Patient with HTN, DMII, and Peripheral neuropathy does not have Advance Directives . Patient needs support, education, and care coordination in order to complete Advance Directive . Patient has review information but has not completed packet . States she knows she needs to complete but has questions . Does not have time to review today Clinical Social Work Clinical Goal(s):  . patient will verbalize basic understanding of Advanced Directives and importance of completion, discuss with her daughter and niece. Interventions: . A voluntary discussion about advanced care planning including explanation and discussion of advanced directives was discussed with the patient.   . Assessed barriers, needs and goal Patient Self Care Activities:  . Is able to complete documentation independently however would like LCSW to assist   . Unable to identify, power or attorney and will talk to daughter and niece  . Patient will call office or LCSW if and when she is ready to work on advance directives  . Please see past updates related to this goal by clicking on the "Past Updates" button in the selected goal       Materials provided:  [name] received Care Management services today:  1. Care Management services include personalized support from designated clinical staff supervised by her physician, including individualized plan of care and coordination with other care providers 2. 24/7 contact [phone] for assistance for urgent and routine care needs. 3. Care Management are  voluntary services and be declined at any time by calling the office.  Patient verbalizes understanding of instructions provided today.  Follow up plan: no follow up scheduled   [name], LCSW

## 2019-07-14 NOTE — Chronic Care Management (AMB) (Signed)
Care Management   Clinical Social Work Follow Up   07/14/2019 Name: Clairessa Madura MRN: [id number] DOB: [birth year]  Referred by: Richarda Osmond, DO  Reason for referral : Care Coordination (Advance Directives)  Starley Jezewski is a 74 y.o. year old female who is a primary care patient of Richarda Osmond, DO.  Reason for follow-up: Brief phone encounter with patient today for ongoing assessment and brief interventions to assist with advance directives.    Assessment: Patient reports doing well.  Is not ready to move forward with advance directives.   Review of patient status, including review of consultants reports, relevant laboratory and other test results, and collaboration with appropriate care team members and the patient's provider was performed as part of comprehensive patient evaluation and provision of care management services.    Advance Directive Status: N See Care Planfor related entries. SDOH (Social Determinants of Health) assessments performed: No new needs identified today.   Goals Addressed            This Visit's Progress   . Advanced Care Planning   Not on track     [place] (see longtitudinal plan of care for additional care plan information)  Current Barriers & progress:  . Patient with HTN, DMII, and Peripheral neuropathy does not have Advance Directives . Patient needs support, education, and care coordination in order to complete Advance Directive . Patient has review information but has not completed packet . States she knows she needs to complete but has questions . Does not have time to review today Clinical Social Work Clinical Goal(s):  . patient will verbalize basic understanding of Advanced Directives and importance of completion, discuss with her daughter and niece. Interventions: . A voluntary discussion about advanced care planning including explanation and discussion of advanced directives was discussed with the patient.    . Assessed barriers, needs and goal Patient Self Care Activities:  . Is able to complete documentation independently however would like LCSW to assist   . Unable to identify, power or attorney and will talk to daughter and niece  . Patient will call office or LCSW if and when she is ready to work on advance directives  . Please see past updates related to this goal by clicking on the "Past Updates" button in the selected goal        Outpatient Encounter Medications as of 07/14/2019  Medication Sig  . aspirin 81 MG tablet Take 1 tablet (81 mg total) by mouth daily.  Marland Kitchen atorvastatin (LIPITOR) 40 MG tablet TAKE 1 TABLET (40 MG TOTAL) BY MOUTH DAILY.  . baclofen (LIORESAL) 10 MG tablet TAKE 1/2 TABLET 2 TIMES A DAY AS NEEDED FOR MUSCLE SPASM  . brimonidine-timolol (COMBIGAN) 0.2-0.5 % ophthalmic solution Place 1 drop into both eyes every 12 (twelve) hours. 1 drop each eye every 12 hours  . calcium carbonate (OS-CAL) 600 MG TABS Take 1 tablet (600 mg total) by mouth daily.  . cholecalciferol (VITAMIN D) 1000 UNITS tablet Take 1 tablet (1,000 Units total) by mouth daily.  . dorzolamide (TRUSOPT) 2 % ophthalmic solution Place 1 drop into the right eye 2 (two) times daily.  . dorzolamide (TRUSOPT) 2 % ophthalmic solution   . gabapentin (NEURONTIN) 600 MG tablet Take 1 tablet (600 mg total) by mouth 3 (three) times daily.  . hydrochlorothiazide (HYDRODIURIL) 25 MG tablet Take 1 tablet (25 mg total) by mouth daily.  Marland Kitchen HYDROcodone-acetaminophen (NORCO) 10-325 MG tablet TAKE 1 TABLET BY ORAL ROUTE EVERY  8 HOURS AS NEEDED FOR PAIN, 30 DAY SUPPLY  . latanoprost (XALATAN) 0.005 % ophthalmic solution Place 1 drop into both eyes at bedtime.  Marland Kitchen losartan (COZAAR) 25 MG tablet Take 1 tablet (25 mg total) by mouth at bedtime.  . metFORMIN (GLUCOPHAGE) 500 MG tablet TAKE 1 TABLET TWICE A DAY WITH A MEAL  . Multiple Vitamin (MULTIVITAMIN WITH MINERALS) TABS tablet Take 1 tablet by mouth daily.  Marland Kitchen omeprazole  (PRILOSEC) 20 MG capsule Take 1 capsule (20 mg total) by mouth 2 (two) times daily as needed.  Marland Kitchen RHOPRESSA 0.02 % SOLN   . VYZULTA 0.024 % SOLN    Facility-Administered Encounter Medications as of 07/14/2019  Medication  . 0.9 %  sodium chloride infusion   Plan: No follow up scheduled.  Patient states she will call office when she is ready to move forward with advance directives.  Casimer Lanius, LCSW Clinical Social Worker [place] / [place]   [phone] 3:30 PM

## 2019-07-29 DIAGNOSIS — M47816 Spondylosis without myelopathy or radiculopathy, lumbar region: Secondary | ICD-10-CM | POA: Diagnosis not present

## 2019-07-29 DIAGNOSIS — M4150 Other secondary scoliosis, site unspecified: Secondary | ICD-10-CM | POA: Diagnosis not present

## 2019-08-05 ENCOUNTER — Other Ambulatory Visit: Payer: Self-pay

## 2019-08-05 ENCOUNTER — Other Ambulatory Visit: Payer: Self-pay | Admitting: Family Medicine

## 2019-08-05 MED ORDER — METFORMIN HCL 500 MG PO TABS: ORAL_TABLET | ORAL | 3 refills | Status: DC

## 2019-08-06 ENCOUNTER — Telehealth: Payer: Self-pay

## 2019-08-06 ENCOUNTER — Other Ambulatory Visit: Payer: Self-pay | Admitting: *Deleted

## 2019-08-06 NOTE — Telephone Encounter (Signed)
LVM for patient to call in to schedule a follow up appointment for BP check.  [name], [place]

## 2019-08-20 ENCOUNTER — Other Ambulatory Visit: Payer: Self-pay

## 2019-08-20 ENCOUNTER — Ambulatory Visit (INDEPENDENT_AMBULATORY_CARE_PROVIDER_SITE_OTHER): Payer: Medicare Other | Admitting: Student in an Organized Health Care Education/Training Program

## 2019-08-20 ENCOUNTER — Encounter: Payer: Self-pay | Admitting: Student in an Organized Health Care Education/Training Program

## 2019-08-20 VITALS — BP 135/80 | HR 79 | Ht 63.0 in | Wt 187.6 lb

## 2019-08-20 DIAGNOSIS — I10 Essential (primary) hypertension: Secondary | ICD-10-CM | POA: Diagnosis not present

## 2019-08-20 DIAGNOSIS — G6281 Critical illness polyneuropathy: Secondary | ICD-10-CM | POA: Diagnosis not present

## 2019-08-20 DIAGNOSIS — E1142 Type 2 diabetes mellitus with diabetic polyneuropathy: Secondary | ICD-10-CM

## 2019-08-20 LAB — POCT GLYCOSYLATED HEMOGLOBIN (HGB A1C): HbA1c, POC (controlled diabetic range): 5.9 % (ref 0.0–7.0)

## 2019-08-20 MED ORDER — METFORMIN HCL 500 MG PO TABS: ORAL_TABLET | ORAL | 3 refills | Status: DC

## 2019-08-20 MED ORDER — GABAPENTIN 600 MG PO TABS: 600.0000 mg | ORAL_TABLET | Freq: Three times a day (TID) | ORAL | 1 refills | Status: DC

## 2019-08-20 NOTE — Patient Instructions (Signed)
It was a pleasure to see you today!  To summarize our discussion for this visit:  Your blood pressure looks good today. I'm sorry to hear that you are having difficulty getting her medications.  Let me know if there is anything else I can do to help you with those.  I am refilling your gabapentin and Metformin today.  Your diabetes seems well controlled today and we are also checking your cholesterol.  Please see me again in about 5 months to follow-up for your blood pressure.  Keep an eye on your leg and be aware of swelling and redness.  Some additional health maintenance measures we should update are: Health Maintenance Due  Topic Date Due  . COVID-19 Vaccine (1) Never done  . FOOT EXAM  07/11/2017  . OPHTHALMOLOGY EXAM  09/16/2017  . TETANUS/TDAP  03/01/2019  .    Call the clinic at [phone] if your symptoms worsen or you have any concerns.   Thank you for allowing me to take part in your care,  Dr. Doristine Mango   Deep Vein Thrombosis  Deep vein thrombosis (DVT) is a condition in which a blood clot forms in a deep vein, such as a lower leg, thigh, or arm vein. A clot is blood that has thickened into a gel or solid. This condition is dangerous. It can lead to serious and even life-threatening complications if the clot travels to the lungs and causes a blockage (pulmonary embolism). It can also damage veins in the leg. This can result in leg pain, swelling, discoloration, and sores (post-thrombotic syndrome). What are the causes? This condition may be caused by:  A slowdown of blood flow.  Damage to a vein.  A condition that causes blood to clot more easily, such as an inherited clotting disorder. What increases the risk? The following factors may make you more likely to develop this condition:  Being overweight.  Being older, especially over age 41.  Sitting or lying down for more than four hours.  Being in the hospital.  Lack of physical activity  (sedentary lifestyle).  Pregnancy, being in childbirth, or having recently given birth.  Taking medicines that contain estrogen, such as medicines to prevent pregnancy.  Smoking.  A history of any of the following: ? Blood clots or a blood clotting disease. ? Peripheral vascular disease. ? Inflammatory bowel disease. ? Cancer. ? Heart disease. ? Genetic conditions that affect how your blood clots, such as Factor V Leiden mutation. ? Neurological diseases that affect your legs (leg paresis). ? A recent injury, such as a car accident. ? Major or lengthy surgery. ? A central line placed inside a large vein. What are the signs or symptoms? Symptoms of this condition include:  Swelling, pain, or tenderness in an arm or leg.  Warmth, redness, or discoloration in an arm or leg. If the clot is in your leg, symptoms may be more noticeable or worse when you stand or walk. Some people may not develop any symptoms. How is this diagnosed? This condition is diagnosed with:  A medical history and physical exam.  Tests, such as: ? Blood tests. These are done to check how well your blood clots. ? Ultrasound. This is done to check for clots. ? Venogram. For this test, contrast dye is injected into a vein and X-rays are taken to check for any clots. How is this treated? Treatment for this condition depends on:  The cause of your DVT.  Your risk for bleeding or developing  more clots.  Any other medical conditions that you have. Treatment may include:  Taking a blood thinner (anticoagulant). This type of medicine prevents clots from forming. It may be taken by mouth, injected under the skin, or injected through an IV (catheter).  Injecting clot-dissolving medicines into the affected vein (catheter-directed thrombolysis).  Having surgery. Surgery may be done to: ? Remove the clot. ? Place a filter in a large vein to catch blood clots before they reach the lungs. Some treatments may be  continued for up to six months. Follow these instructions at home: If you are taking blood thinners:  Take the medicine exactly as told by your health care provider. Some blood thinners need to be taken at the same time every day. Do not skip a dose.  Talk with your health care provider before you take any medicines that contain aspirin or NSAIDs. These medicines increase your risk for dangerous bleeding.  Ask your health care provider about foods and drugs that could change the way the medicine works (may interact). Avoid those things if your health care provider tells you to do so.  Blood thinners can cause easy bruising and may make it difficult to stop bleeding. Because of this: ? Be very careful when using knives, scissors, or other sharp objects. ? Use an electric razor instead of a blade. ? Avoid activities that could cause injury or bruising, and follow instructions about how to prevent falls.  Wear a medical alert bracelet or carry a card that lists what medicines you take. General instructions  Take over-the-counter and prescription medicines only as told by your health care provider.  Return to your normal activities as told by your health care provider. Ask your health care provider what activities are safe for you.  Wear compression stockings if recommended by your health care provider.  Keep all follow-up visits as told by your health care provider. This is important. How is this prevented? To lower your risk of developing this condition again:  For 30 or more minutes every day, do an activity that: ? Involves moving your arms and legs. ? Increases your heart rate.  When traveling for longer than four hours: ? Exercise your arms and legs every hour. ? Drink plenty of water. ? Avoid drinking alcohol.  Avoid sitting or lying for a long time without moving your legs.  If you have surgery or you are hospitalized, ask about ways to prevent blood clots. These may  include taking frequent walks or using anticoagulants.  Stay at a healthy weight.  If you are a woman who is older than age 46, avoid unnecessary use of medicines that contain estrogen, such as some birth control pills.  Do not use any products that contain nicotine or tobacco, such as cigarettes and e-cigarettes. This is especially important if you take estrogen medicines. If you need help quitting, ask your health care provider. Contact a health care provider if:  You miss a dose of your blood thinner.  Your menstrual period is heavier than usual.  You have unusual bruising. Get help right away if:  You have: ? New or increased pain, swelling, or redness in an arm or leg. ? Numbness or tingling in an arm or leg. ? Shortness of breath. ? Chest pain. ? A rapid or irregular heartbeat. ? A severe headache or confusion. ? A cut that will not stop bleeding.  There is blood in your vomit, stool, or urine.  You have a serious fall or  accident, or you hit your head.  You feel light-headed or dizzy.  You cough up blood. These symptoms may represent a serious problem that is an emergency. Do not wait to see if the symptoms will go away. Get medical help right away. Call your local emergency services (911 in the [place] Do not drive yourself to the hospital. Summary  Deep vein thrombosis (DVT) is a condition in which a blood clot forms in a deep vein, such as a lower leg, thigh, or arm vein.  Symptoms can include swelling, warmth, pain, and redness in your leg or arm.  This condition may be treated with a blood thinner (anticoagulant medicine), medicine that is injected to dissolve blood clots,compression stockings, or surgery.  If you are prescribed blood thinners, take them exactly as told. This information is not intended to replace advice given to you by your health care provider. Make sure you discuss any questions you have with your health care provider. Document Revised:  03/07/2017 Document Reviewed: 08/23/2016 Elsevier Patient Education  [place].

## 2019-08-20 NOTE — Progress Notes (Signed)
a1c

## 2019-08-21 LAB — LIPID PANEL
Chol/HDL Ratio: 1.9 ratio (ref 0.0–4.4)
Cholesterol, Total: 130 mg/dL (ref 100–199)
HDL: 69 mg/dL (ref >39)
LDL Chol Calc (NIH): 49 mg/dL (ref 0–99)
Triglycerides: 54 mg/dL (ref 0–149)
VLDL Cholesterol Cal: 12 mg/dL (ref 5–40)

## 2019-08-23 MED ORDER — LOSARTAN POTASSIUM 25 MG PO TABS: 25.0000 mg | ORAL_TABLET | Freq: Every day | ORAL | 3 refills | Status: DC

## 2019-08-23 MED ORDER — BACLOFEN 10 MG PO TABS: ORAL_TABLET | ORAL | 0 refills | Status: DC

## 2019-08-24 NOTE — Assessment & Plan Note (Signed)
Refilled gabapentin

## 2019-08-24 NOTE — Assessment & Plan Note (Signed)
Adherent with current treatment without negative side effects. BP at goal. Monitors at home.  Recheck ~6 months

## 2019-08-24 NOTE — Assessment & Plan Note (Signed)
A1c and lipid panel today Continue, refill metformin

## 2019-08-24 NOTE — Progress Notes (Signed)
    SUBJECTIVE:   CHIEF COMPLAINT / HPI: BP f/u  BP- well controlled today. Adherent with HCTZ and losartan. Patient having difficulty getting medications mailed to her house from pharmacy so switched pharmacies. She states that she has not run out of medications any days. Denies CP, HA, SOB. Some LE edema on left which is chronic. Non-erythematous or increased warmth or pain.   Diabetes- well controlled. Adherent with metformin. checking A1c, lipid panel.   PERTINENT  PMH / PSH: post-polio syndrom  OBJECTIVE:   BP 135/80   Pulse 79   Ht 5\' 3"  (1.6 m)   Wt 187 lb 9.6 oz (85.1 kg)   SpO2 98%   BMI 33.23 kg/m   General: NAD, pleasant, able to participate in exam Cardiac: RRR, normal heart sounds, no murmurs. 2+ radial and PT pulses bilaterally Respiratory: CTAB, normal effort, No wheezes, rales or rhonchi Abdomen: soft, nontender, nondistended, no hepatic or splenomegaly, +BS Extremities: LLE trace edema, non-painful or erythematous to palpation. Good distal circulation. Strength intact. RLE shows chronic atrophy and 1/5 strength.  Skin: warm and dry, no rashes noted Neuro: alert and oriented x4, no focal deficits Psych: Normal affect and mood   ASSESSMENT/PLAN:   HTN (hypertension) Adherent with current treatment without negative side effects. BP at goal. Monitors at home.  Recheck ~6 months  T2DM (type 2 diabetes mellitus) A1c and lipid panel today Continue, refill metformin  Peripheral neuropathy Refilled gabapentin     Richarda Osmond, [place]

## 2019-08-30 DIAGNOSIS — E1140 Type 2 diabetes mellitus with diabetic neuropathy, unspecified: Secondary | ICD-10-CM | POA: Diagnosis not present

## 2019-09-07 ENCOUNTER — Telehealth: Payer: Self-pay

## 2019-09-07 NOTE — Telephone Encounter (Signed)
Patient calls nurse line to report continued pain in Left leg. Patient reports that medications are not helping and that she is rating pain at 8/10. Patient denies redness or swelling.   Patient states that she discussed with provider potentially getting [place] on leg.   To PCP  Please advise.   Talbot Grumbling, RN

## 2019-09-13 ENCOUNTER — Ambulatory Visit (HOSPITAL_BASED_OUTPATIENT_CLINIC_OR_DEPARTMENT_OTHER): Admit: 2019-09-13 | Discharge: 2019-09-13 | Disposition: A | Discharge status: Home / Self Care | Payer: Medicare Other

## 2019-09-13 ENCOUNTER — Encounter (HOSPITAL_COMMUNITY): Payer: Self-pay | Admitting: Emergency Medicine

## 2019-09-13 ENCOUNTER — Other Ambulatory Visit: Payer: Self-pay

## 2019-09-13 ENCOUNTER — Emergency Department (HOSPITAL_COMMUNITY)
Admission: EM | Admit: 2019-09-13 | Discharge: 2019-09-13 | Disposition: A | Discharge status: Home / Self Care | Payer: Medicare Other | Attending: Emergency Medicine | Admitting: Emergency Medicine

## 2019-09-13 DIAGNOSIS — Z7982 Long term (current) use of aspirin: Secondary | ICD-10-CM | POA: Diagnosis not present

## 2019-09-13 DIAGNOSIS — M79605 Pain in left leg: Secondary | ICD-10-CM

## 2019-09-13 DIAGNOSIS — Z79899 Other long term (current) drug therapy: Secondary | ICD-10-CM | POA: Diagnosis not present

## 2019-09-13 DIAGNOSIS — Z7984 Long term (current) use of oral hypoglycemic drugs: Secondary | ICD-10-CM | POA: Diagnosis not present

## 2019-09-13 DIAGNOSIS — E119 Type 2 diabetes mellitus without complications: Secondary | ICD-10-CM | POA: Insufficient documentation

## 2019-09-13 DIAGNOSIS — Z8612 Personal history of poliomyelitis: Secondary | ICD-10-CM | POA: Insufficient documentation

## 2019-09-13 DIAGNOSIS — M7989 Other specified soft tissue disorders: Secondary | ICD-10-CM

## 2019-09-13 DIAGNOSIS — M79609 Pain in unspecified limb: Secondary | ICD-10-CM

## 2019-09-13 DIAGNOSIS — I10 Essential (primary) hypertension: Secondary | ICD-10-CM | POA: Insufficient documentation

## 2019-09-13 MED ORDER — METHYL SALICYLATE-LIDO-MENTHOL 4-4-5 % EX PTCH: 1.0000 | MEDICATED_PATCH | Freq: Two times a day (BID) | CUTANEOUS | 0 refills | Status: AC

## 2019-09-13 NOTE — Progress Notes (Signed)
Left lower extremity venous duplex completed. Refer to "CV Proc" under chart review to view preliminary results.  09/13/2019 3:19 PM Kelby Aline., MHA, RVT, RDCS, RDMS

## 2019-09-13 NOTE — ED Provider Notes (Signed)
[facility] EMERGENCY DEPARTMENT Provider Note   CSN: [id number] Arrival date & time: 09/13/19  1331     History Chief Complaint  Patient presents with  . Leg Pain    [name] is a 74 y.o. female.  HPI    Patient presents with concern of leg pain. Pain is been present for months, possibly a few weeks longer.  Pain is left leg, from the lateral gluteus to the leg distally.  Patient has Morton's neuroma, but otherwise no new foot pain.  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 No skin color changes, no chest pain, no dyspnea.  Pain is become worse, in spite of taking her home medication including gabapentin, over the past few days.  After speaking with her physician, and discussing return precautions, follow-up instructions, have been instructed to go to the emergency department for worsening of her condition she presents for evaluation.  Past Medical History:  Diagnosis Date  . Arthritis    osteoarthritis  . Carpal tunnel syndrome on both sides    Followed by Dr. Burney Gauze  . Cataracts, bilateral    immature  . Chronic back pain    stenosis  . DDD (degenerative disc disease)    Lumbar Spondylosis  . Diabetes mellitus    takes Metformin daily  . GERD (gastroesophageal reflux disease)    takes Omeprazole daily  . Glaucoma   . Hyperlipidemia    takes Atorvastatin daily  . Hypertension    takes Ramipril daily as well as HCTZ  . Muscle spasm    takes Baclofen as needed  . 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 Peripheral neuropathy    takes Gabapentin daily  . 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 Vitamin D deficiency    takes Vit D daily    Patient Active Problem List   Diagnosis Date Noted  . Peripheral neuropathy   . Acromioclavicular joint arthritis 10/24/2017  . Eye muscle weakness, right 08/04/2017  .  S/P lumbar fusion 07/11/2016  . Osteoarthritis of spine with radiculopathy, lumbar region 05/10/2016  . GERD (gastroesophageal reflux disease) 10/26/2014  . T2DM (type 2 diabetes mellitus) ([place]) 05/31/2014  . 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 Obesity (BMI 30-39.9) 03/24/2012  . HTN (hypertension) 08/03/2010  . Hyperlipidemia 08/03/2010  . Glaucoma 08/03/2010  . Radicular low back pain 08/03/2010    Past Surgical History:  Procedure Laterality Date  . ABDOMINAL HYSTERECTOMY  1980   partial  . BREAST BIOPSY Bilateral 2017,2018   both benign  . CHOLECYSTECTOMY    . COLONOSCOPY    . HAND SURGERY     Right wrist, s/p plate removal in Feb 2012  . JOINT REPLACEMENT  2006   left knee  . left hand surgery    . SPINE SURGERY  2018     OB History   No obstetric history on file.     Family History  Problem Relation Age of Onset  . Heart disease Father   . Kidney disease Father   . Heart disease Mother   . Heart disease Sister   . Hyperlipidemia Sister   . Hypertension Sister   . Kidney disease Sister   . Heart failure Sister   . Kidney disease Brother   . Stomach cancer Maternal Aunt   . Colon cancer  Neg Hx   . Esophageal cancer Neg Hx   . Rectal cancer Neg Hx     Social History   Tobacco Use  . Smoking status: Never Smoker  . Smokeless tobacco: Never Used  Substance Use Topics  . Alcohol use: Never  . Drug use: No    Home Medications Prior to Admission medications   Medication Sig Start Date End Date Taking? Authorizing Provider  aspirin 81 MG tablet Take 1 tablet (81 mg total) by mouth daily. 09/30/11   Deneise Lever, MD  atorvastatin (LIPITOR) 40 MG tablet TAKE 1 TABLET (40 MG TOTAL) BY MOUTH DAILY. 04/19/19   Anderson, Chelsey L, DO  baclofen (LIORESAL) 10 MG tablet TAKE 1/2 TABLET DAILY AS NEEDED FOR MUSCLE SPASM 08/23/19   Ouida Sills, Chelsey L, DO  brimonidine-timolol (COMBIGAN) 0.2-0.5 % ophthalmic solution Place 1 drop into both eyes every 12  (twelve) hours. 1 drop each eye every 12 hours 09/30/11   Deneise Lever, MD  calcium carbonate (OS-CAL) 600 MG TABS Take 1 tablet (600 mg total) by mouth daily. 09/30/11   Deneise Lever, MD  cholecalciferol (VITAMIN D) 1000 UNITS tablet Take 1 tablet (1,000 Units total) by mouth daily. 10/14/13   Virginia Crews, MD  dorzolamide (TRUSOPT) 2 % ophthalmic solution Place 1 drop into the right eye 2 (two) times daily.    [provider]  dorzolamide (TRUSOPT) 2 % ophthalmic solution  08/24/18   [provider]  gabapentin (NEURONTIN) 600 MG tablet Take 1 tablet (600 mg total) by mouth 3 (three) times daily. 08/20/19   Anderson, Chelsey L, DO  hydrochlorothiazide (HYDRODIURIL) 25 MG tablet Take 1 tablet (25 mg total) by mouth daily. 04/14/19   Anderson, Chelsey L, DO  HYDROcodone-acetaminophen (NORCO) 10-325 MG tablet TAKE 1 TABLET BY ORAL ROUTE EVERY 8 HOURS AS NEEDED FOR PAIN, 30 DAY SUPPLY 07/08/18   [provider]  latanoprost (XALATAN) 0.005 % ophthalmic solution Place 1 drop into both eyes at bedtime.    [provider]  losartan (COZAAR) 25 MG tablet Take 1 tablet (25 mg total) by mouth at bedtime. 08/23/19   Anderson, Chelsey L, DO  metFORMIN (GLUCOPHAGE) 500 MG tablet TAKE 1 TABLET TWICE A DAY WITH A MEAL 08/20/19   Doristine Mango L, DO  Multiple Vitamin (MULTIVITAMIN WITH MINERALS) TABS tablet Take 1 tablet by mouth daily.    [provider]  omeprazole (PRILOSEC) 20 MG capsule Take 1 capsule (20 mg total) by mouth 2 (two) times daily as needed. 04/19/19   Ouida Sills, Chelsey L, DO  RHOPRESSA 0.02 % SOLN  08/27/18   [provider]  VYZULTA 0.024 % SOLN  07/11/18   [provider]    Allergies    Patient has no known allergies.  Review of Systems   Review of Systems  Constitutional:       Per HPI, otherwise negative  HENT:       Per HPI, otherwise negative  Respiratory:       Per HPI, otherwise negative  Cardiovascular:        Per HPI, otherwise negative  Gastrointestinal: Negative for vomiting.  Endocrine:       Negative aside from HPI  Genitourinary:       Neg aside from HPI   Musculoskeletal:       Per HPI, otherwise negative  Skin: Negative.   Neurological: Negative for syncope.    Physical Exam Updated Vital Signs BP (!) 149/76 (BP Location: Right  Arm)   Pulse 64   Temp 98.6 F (37 C) (Oral)   Resp 18   Ht 5\' 3"  (1.6 m)   Wt 84.8 kg   SpO2 100%   BMI 33.13 kg/m   Physical Exam Vitals and nursing note reviewed.  Constitutional:      General: She is not in acute distress.    Appearance: She is well-developed.  HENT:     Head: Normocephalic and atraumatic.  Eyes:     Conjunctiva/sclera: Conjunctivae normal.  Cardiovascular:     Rate and Rhythm: Normal rate and regular rhythm.  Pulmonary:     Effort: Pulmonary effort is normal. No respiratory distress.     Breath sounds: Normal breath sounds. No stridor.  Abdominal:     General: There is no distension.  Musculoskeletal:        General: No swelling, tenderness, deformity or signs of injury.     Right lower leg: No edema.     Left lower leg: Edema (Trace, nonpitting) present.  Skin:    General: Skin is warm and dry.  Neurological:     Mental Status: She is alert and oriented to person, place, and time.     Cranial Nerves: No cranial nerve deficit.     ED Results / Procedures / Treatments   Labs (all labs ordered are listed, but only abnormal results are displayed) Labs Reviewed - No data to display  EKG None  Radiology VAS [place] LOWER EXTREMITY VENOUS (DVT) ([facility] and [facility] 7a-7p)  Result Date: 09/13/2019  Lower Venous DVTStudy Indications: Pain, and Swelling.  Limitations: Poor ultrasound/tissue interface. Comparison Study: No prior study Performing Technologist: Maudry Mayhew MHA, RDMS, RVT, RDCS  Examination Guidelines: A complete evaluation includes B-mode imaging, spectral Doppler, color Doppler, and power Doppler as needed  of all accessible portions of each vessel. Bilateral testing is considered an integral part of a complete examination. Limited examinations for reoccurring indications may be performed as noted. The reflux portion of the exam is performed with the patient in reverse Trendelenburg.  +-----+---------------+---------+-----------+----------+--------------+ RIGHTCompressibilityPhasicitySpontaneityPropertiesThrombus Aging +-----+---------------+---------+-----------+----------+--------------+ CFV  Full           Yes      Yes                                 +-----+---------------+---------+-----------+----------+--------------+   +---------+---------------+---------+-----------+----------+--------------+ LEFT     CompressibilityPhasicitySpontaneityPropertiesThrombus Aging +---------+---------------+---------+-----------+----------+--------------+ CFV      Full           Yes      Yes                                 +---------+---------------+---------+-----------+----------+--------------+ SFJ      Full                                                        +---------+---------------+---------+-----------+----------+--------------+ FV Prox  Full                                                        +---------+---------------+---------+-----------+----------+--------------+  FV Mid   Full                                                        +---------+---------------+---------+-----------+----------+--------------+ FV DistalFull                                                        +---------+---------------+---------+-----------+----------+--------------+ PFV      Full                                                        +---------+---------------+---------+-----------+----------+--------------+ POP      Full           Yes      Yes                                 +---------+---------------+---------+-----------+----------+--------------+ PTV       Full                                                        +---------+---------------+---------+-----------+----------+--------------+ PERO     Full                                                        +---------+---------------+---------+-----------+----------+--------------+     Summary: RIGHT: - No evidence of common femoral vein obstruction.  LEFT: - There is no evidence of deep vein thrombosis in the lower extremity.  - No cystic structure found in the popliteal fossa.  *See table(s) above for measurements and observations. Electronically signed by Curt Jews MD on 09/13/2019 at 7:01:04 PM.    Final     Procedures Procedures (including critical care time)  Medications Ordered in ED Medications - No data to display  ED Course  I have reviewed the triage vital signs and the nursing notes.  Pertinent labs & imaging results that were available during my care of the patient were reviewed by me and considered in my medical decision making (see chart for details).     After initial evaluation reviewed the patient's ultrasound results.  No evidence for DVT, left, or right side.  Absent skin color change, fever, low suspicion for infection.  Absent chest pain, dyspnea, no evidence for PE, ACS.  Given the duration of symptoms, description of pain from the gluteal area to the leg distally there are some suspicion for lumbosacral radiculopathy. Patient amenable to, appropriate for initiation of additional therapy, following up as an outpatient.  None she is already on gabapentin, but has not maximized her doses.  In addition to increasing this, patient will have topical diclofenac patches provided for additional  relief.  Final Clinical Impression(s) / ED Diagnoses Final diagnoses:  Left leg pain    Rx / DC Orders ED Discharge Orders         Ordered    Methyl Salicylate-Lido-Menthol 4-4-5 % PTCH  Every 12 hours     09/13/19 2111           Carmin Muskrat, MD 09/13/19  2113

## 2019-09-13 NOTE — Discharge Instructions (Addendum)
As discussed, today's evaluation has been generally reassuring. There is no evidence for deep vein thrombosis in either leg.  There is some suspicion for lumbosacral radiculopathy, or sciatica contributing to your pain.  For relief, please begin using topical medicated patches including the ingredients methyl salicylate and lidocaine.  These can be purchased at any local pharmacy, [place], [place], [facility], if your pharmacy coverage does not include the prescription.  Please apply the patch to the middle of your lower back, and change patches every 12 hours.  Monitor your condition carefully and be sure to follow-up with your physician or return here for concerning changes in your condition.

## 2019-09-13 NOTE — ED Triage Notes (Signed)
Onset couple of months ago developed left leg pain no trauma. Seen doctor last month and if symptoms continued to call back. Patient did however Doctor office never called her back. Patient concerned of a possible blood clot. Pain currently 8/10 sharp.

## 2019-09-22 ENCOUNTER — Other Ambulatory Visit: Payer: Self-pay | Admitting: Student in an Organized Health Care Education/Training Program

## 2019-10-04 DIAGNOSIS — M4726 Other spondylosis with radiculopathy, lumbar region: Secondary | ICD-10-CM | POA: Diagnosis not present

## 2019-10-04 DIAGNOSIS — M4150 Other secondary scoliosis, site unspecified: Secondary | ICD-10-CM | POA: Diagnosis not present

## 2019-10-06 ENCOUNTER — Other Ambulatory Visit: Payer: Self-pay

## 2019-10-06 DIAGNOSIS — I10 Essential (primary) hypertension: Secondary | ICD-10-CM

## 2019-10-06 MED ORDER — BACLOFEN 10 MG PO TABS: ORAL_TABLET | ORAL | 2 refills | Status: DC

## 2019-10-06 MED ORDER — HYDROCHLOROTHIAZIDE 25 MG PO TABS: 25.0000 mg | ORAL_TABLET | Freq: Every day | ORAL | 3 refills | Status: DC

## 2019-10-07 DIAGNOSIS — M47816 Spondylosis without myelopathy or radiculopathy, lumbar region: Secondary | ICD-10-CM | POA: Diagnosis not present

## 2019-10-18 ENCOUNTER — Other Ambulatory Visit: Payer: Self-pay

## 2019-10-18 ENCOUNTER — Ambulatory Visit (INDEPENDENT_AMBULATORY_CARE_PROVIDER_SITE_OTHER): Payer: Medicare Other | Admitting: Podiatry

## 2019-10-18 DIAGNOSIS — G5763 Lesion of plantar nerve, bilateral lower limbs: Secondary | ICD-10-CM | POA: Diagnosis not present

## 2019-10-18 DIAGNOSIS — E0843 Diabetes mellitus due to underlying condition with diabetic autonomic (poly)neuropathy: Secondary | ICD-10-CM

## 2019-10-18 DIAGNOSIS — M21769 Unequal limb length (acquired), unspecified tibia and fibula: Secondary | ICD-10-CM

## 2019-10-18 NOTE — Progress Notes (Signed)
° °  HPI: 74 year old female with past medical history of diabetes mellitus presenting today for follow up evaluation of Morton's neuroma and peripheral neuropathy to the bilateral feet.  Patient states the injection she received last visit on 12/23/2018 helped significantly.  She was unable to take the Lyrica due to nausea and vomiting.  She has since discontinued the Lyrica.  She presents today hoping to receive an anti-inflammatory steroid injection to the neuroma areas since she got so much relief from the last injections.  She is also currently wearing her diabetic shoes with insoles that were dispensed here in the office.  Past Medical History:  Diagnosis Date   Arthritis    osteoarthritis   Carpal tunnel syndrome on both sides    Followed by Dr. 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  Dermatology: Skin is warm, dry and supple bilateral lower extremities. Negative for open lesions or macerations.  Vascular: Palpable pedal pulses bilaterally. No edema or erythema noted. Capillary refill within normal limits.  Neurological: Epicritic and protective threshold diminished bilaterally.   Musculoskeletal Exam: Sharp pain with palpation of the bilateral third interspace and lateral compression of the metatarsal heads  consistent with neuroma bilateral. Positive Conley Canal sign with loadbearing of the forefoot.  Limb length discrepancy with right lower extremity shorter than the left lower extremity.  Range of motion within normal limits to all pedal and ankle joints bilateral. Muscle strength 5/5 in all groups bilateral.      Assessment: 1. DM with peripheral neuropathy 2. 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 Limb length discrepancy, right shorter than left   Plan of Care:  1. Patient evaluated. X-Rays reviewed.  2. Injection of 0.5 mLs Celestone Soluspan injected into the neuroma of the bilateral third interspace.  3.  Patient has not had success with Lyrica or gabapentin for her peripheral neuropathy  4.  Continue DM shoes and insoles 5. Return to clinic as needed.      Edrick Kins, DPM [facility]  Dr. Edrick Kins, DPM    [address], [place] [address]                Office [phone]  Fax [phone]

## 2019-10-20 DIAGNOSIS — H401113 Primary open-angle glaucoma, right eye, severe stage: Secondary | ICD-10-CM | POA: Diagnosis not present

## 2019-10-21 ENCOUNTER — Other Ambulatory Visit: Payer: Self-pay | Admitting: Neurosurgery

## 2019-10-21 DIAGNOSIS — M4726 Other spondylosis with radiculopathy, lumbar region: Secondary | ICD-10-CM

## 2019-11-15 ENCOUNTER — Ambulatory Visit
Admission: RE | Admit: 2019-11-15 | Discharge: 2019-11-15 | Disposition: A | Discharge status: Home / Self Care | Payer: Medicare Other | Source: Ambulatory Visit | Attending: Neurosurgery | Admitting: Neurosurgery

## 2019-11-15 ENCOUNTER — Telehealth: Payer: Self-pay | Admitting: *Deleted

## 2019-11-15 ENCOUNTER — Other Ambulatory Visit: Payer: Self-pay

## 2019-11-15 DIAGNOSIS — M4726 Other spondylosis with radiculopathy, lumbar region: Secondary | ICD-10-CM

## 2019-11-15 DIAGNOSIS — M4326 Fusion of spine, lumbar region: Secondary | ICD-10-CM | POA: Diagnosis not present

## 2019-11-15 DIAGNOSIS — M5126 Other intervertebral disc displacement, lumbar region: Secondary | ICD-10-CM | POA: Diagnosis not present

## 2019-11-15 DIAGNOSIS — M48061 Spinal stenosis, lumbar region without neurogenic claudication: Secondary | ICD-10-CM | POA: Diagnosis not present

## 2019-11-15 MED ORDER — GADOBENATE DIMEGLUMINE 529 MG/ML IV SOLN
17.0000 mL | Freq: Once | INTRAVENOUS | Status: AC | PRN
  Administered 2019-11-15: 17 mL via INTRAVENOUS

## 2019-11-15 NOTE — Telephone Encounter (Signed)
Received call from patient stating that she needs a new referral to see Dr. Christella Noa with neurosurgery.  Patient has an appt with him on 11-22-2019.  Will forward to MD to sign the referral.  Monroe Qin,CMA

## 2019-11-16 NOTE — Telephone Encounter (Signed)
Referral to neurosurgery signed as requested by patient

## 2019-11-17 DIAGNOSIS — M5416 Radiculopathy, lumbar region: Secondary | ICD-10-CM | POA: Diagnosis not present

## 2019-11-17 DIAGNOSIS — M4150 Other secondary scoliosis, site unspecified: Secondary | ICD-10-CM | POA: Diagnosis not present

## 2019-11-17 DIAGNOSIS — M47816 Spondylosis without myelopathy or radiculopathy, lumbar region: Secondary | ICD-10-CM | POA: Diagnosis not present

## 2019-11-22 DIAGNOSIS — M5416 Radiculopathy, lumbar region: Secondary | ICD-10-CM | POA: Diagnosis not present

## 2019-11-22 DIAGNOSIS — M4150 Other secondary scoliosis, site unspecified: Secondary | ICD-10-CM | POA: Insufficient documentation

## 2019-11-22 DIAGNOSIS — Z981 Arthrodesis status: Secondary | ICD-10-CM | POA: Diagnosis not present

## 2019-11-22 DIAGNOSIS — M4726 Other spondylosis with radiculopathy, lumbar region: Secondary | ICD-10-CM | POA: Diagnosis not present

## 2019-11-24 ENCOUNTER — Other Ambulatory Visit: Payer: Self-pay

## 2019-11-24 MED ORDER — ATORVASTATIN CALCIUM 40 MG PO TABS: ORAL_TABLET | ORAL | 3 refills | Status: DC

## 2019-11-24 MED ORDER — LOSARTAN POTASSIUM 25 MG PO TABS: 25.0000 mg | ORAL_TABLET | Freq: Every day | ORAL | 3 refills | Status: DC

## 2019-12-23 DIAGNOSIS — E1140 Type 2 diabetes mellitus with diabetic neuropathy, unspecified: Secondary | ICD-10-CM | POA: Diagnosis not present

## 2020-01-10 DIAGNOSIS — M5416 Radiculopathy, lumbar region: Secondary | ICD-10-CM | POA: Diagnosis not present

## 2020-01-21 ENCOUNTER — Encounter: Payer: Self-pay | Admitting: Student in an Organized Health Care Education/Training Program

## 2020-01-21 ENCOUNTER — Ambulatory Visit (INDEPENDENT_AMBULATORY_CARE_PROVIDER_SITE_OTHER): Payer: Medicare Other | Admitting: Student in an Organized Health Care Education/Training Program

## 2020-01-21 ENCOUNTER — Other Ambulatory Visit: Payer: Self-pay

## 2020-01-21 VITALS — BP 110/70 | HR 79 | Ht 63.0 in | Wt 183.8 lb

## 2020-01-21 DIAGNOSIS — Z23 Encounter for immunization: Secondary | ICD-10-CM | POA: Diagnosis not present

## 2020-01-21 DIAGNOSIS — E1142 Type 2 diabetes mellitus with diabetic polyneuropathy: Secondary | ICD-10-CM | POA: Diagnosis not present

## 2020-01-21 DIAGNOSIS — I10 Essential (primary) hypertension: Secondary | ICD-10-CM

## 2020-01-21 LAB — POCT GLYCOSYLATED HEMOGLOBIN (HGB A1C): HbA1c, POC (controlled diabetic range): 6.3 % (ref 0.0–7.0)

## 2020-01-21 NOTE — Assessment & Plan Note (Signed)
Well controlled. Asymptomatic Continue current regimen

## 2020-01-21 NOTE — Progress Notes (Signed)
    SUBJECTIVE:   CHIEF COMPLAINT / HPI: DM f/u, flu shot  DM- has been well controlled below goal for >2years. Patient checks blood sugars at home and is resistant to discontinuing her medications. Agrees to decrease metformin to once daily. Does not want to stop all together yet.  Flu shot- given today  HTN- patient adherent with medication. She is stressed out today with her transportation service and having difficulty with getting in/out of vehicle. Asymptomatic.   OBJECTIVE:   BP 110/70   Pulse 79   Ht 5\' 3"  (1.6 m)   Wt 183 lb 12.8 oz (83.4 kg)   SpO2 98%   BMI 32.56 kg/m   Rechecked by provider: 110/70 BP. General: NAD, pleasant, able to participate in exam Cardiac: RRR, normal heart sounds, no murmurs. 2+ radial and PT pulses bilaterally Respiratory: CTAB, normal effort, No wheezes, rales or rhonchi Abdomen: soft, nontender, nondistended, no hepatic or splenomegaly, +BS Extremities: chronic left LE edema. No skin break down. WWP. Skin: warm and dry, no rashes noted Neuro: alert and oriented, no focal deficits Psych: Normal affect and mood  ASSESSMENT/PLAN:   T2DM (type 2 diabetes mellitus) Well controlled.  A1c today Offered to discontinue metformin but patient is hesitant. We decided we will decrease dose from BID to daily 500mg .  - rechecking A1c in 3 months with goal to discontinue metformin altogether if still <8. - checking urine microalbumin and BMP today  Needs flu shot Given high dose today. tolerated well  HTN (hypertension) Well controlled. Asymptomatic Continue current regimen     Richarda Osmond, [place]

## 2020-01-21 NOTE — Assessment & Plan Note (Addendum)
Well controlled.  A1c today Offered to discontinue metformin but patient is hesitant. We decided we will decrease dose from BID to daily 500mg .  - rechecking A1c in 3 months with goal to discontinue metformin altogether if still <8. - checking urine microalbumin and BMP today

## 2020-01-21 NOTE — Patient Instructions (Signed)
It was a pleasure to see you today!  To summarize our discussion for this visit:  We are going to check some basic labs today to monitor your diabetes and kidney function. So far, you have been doing really well  So, we are going to decrease your metformin to once daily. We will recheck your A1c in 3 months and consider taking you off that medication all together.  Thanks for getting your flu shot!  Some additional health maintenance measures we should update are: Health Maintenance Due  Topic Date Due   FOOT EXAM  07/11/2017   OPHTHALMOLOGY EXAM  09/16/2017   TETANUS/TDAP  03/01/2019     Please return to our clinic to see me sooner if you need anything.  Call the clinic at [phone] if your symptoms worsen or you have any concerns.   Thank you for allowing me to take part in your care,  Dr. Doristine Mango

## 2020-01-21 NOTE — Assessment & Plan Note (Signed)
Given high dose today. tolerated well

## 2020-01-22 LAB — BASIC METABOLIC PANEL
BUN/Creatinine Ratio: 13 (ref 12–28)
BUN: 9 mg/dL (ref 8–27)
CO2: 26 mmol/L (ref 20–29)
Calcium: 10.2 mg/dL (ref 8.7–10.3)
Chloride: 103 mmol/L (ref 96–106)
Creatinine, Ser: 0.71 mg/dL (ref 0.57–1.00)
GFR calc Af Amer: 97 mL/min/{1.73_m2} (ref >59)
GFR calc non Af Amer: 84 mL/min/{1.73_m2} (ref >59)
Glucose: 116 mg/dL — ABNORMAL HIGH (ref 65–99)
Potassium: 4.4 mmol/L (ref 3.5–5.2)
Sodium: 142 mmol/L (ref 134–144)

## 2020-01-22 LAB — MICROALBUMIN / CREATININE URINE RATIO
Creatinine, Urine: 103.8 mg/dL
Microalb/Creat Ratio: 7 mg/g creat (ref 0–29)
Microalbumin, Urine: 7.1 ug/mL

## 2020-02-08 DIAGNOSIS — Z981 Arthrodesis status: Secondary | ICD-10-CM | POA: Diagnosis not present

## 2020-02-08 DIAGNOSIS — M47816 Spondylosis without myelopathy or radiculopathy, lumbar region: Secondary | ICD-10-CM | POA: Diagnosis not present

## 2020-02-08 DIAGNOSIS — M5416 Radiculopathy, lumbar region: Secondary | ICD-10-CM | POA: Diagnosis not present

## 2020-02-14 ENCOUNTER — Telehealth: Payer: Self-pay | Admitting: *Deleted

## 2020-02-14 NOTE — Telephone Encounter (Signed)
Rx request for amoxicillin caps. Not on med list. Please advise. Tauri Ethington Kennon Holter, CMA

## 2020-02-15 NOTE — Telephone Encounter (Signed)
Pt doesn't need medication because it is prescribed by her dentist. Delray Alt, [place]

## 2020-02-15 NOTE — Telephone Encounter (Signed)
This is not an appropriate refill. Patient would need an appointment if she has an infection needing antibiotics

## 2020-03-10 ENCOUNTER — Other Ambulatory Visit: Payer: Self-pay | Admitting: Student in an Organized Health Care Education/Training Program

## 2020-03-10 DIAGNOSIS — K219 Gastro-esophageal reflux disease without esophagitis: Secondary | ICD-10-CM

## 2020-03-14 DIAGNOSIS — M5416 Radiculopathy, lumbar region: Secondary | ICD-10-CM | POA: Diagnosis not present

## 2020-03-24 DIAGNOSIS — E1140 Type 2 diabetes mellitus with diabetic neuropathy, unspecified: Secondary | ICD-10-CM | POA: Diagnosis not present

## 2020-04-06 DIAGNOSIS — M4726 Other spondylosis with radiculopathy, lumbar region: Secondary | ICD-10-CM | POA: Diagnosis not present

## 2020-04-06 DIAGNOSIS — M47816 Spondylosis without myelopathy or radiculopathy, lumbar region: Secondary | ICD-10-CM | POA: Diagnosis not present

## 2020-04-06 DIAGNOSIS — Z981 Arthrodesis status: Secondary | ICD-10-CM | POA: Diagnosis not present

## 2020-04-24 ENCOUNTER — Other Ambulatory Visit: Payer: Self-pay | Admitting: Student in an Organized Health Care Education/Training Program

## 2020-04-25 ENCOUNTER — Other Ambulatory Visit: Payer: Self-pay | Admitting: *Deleted

## 2020-05-04 DIAGNOSIS — H401121 Primary open-angle glaucoma, left eye, mild stage: Secondary | ICD-10-CM | POA: Diagnosis not present

## 2020-05-08 ENCOUNTER — Other Ambulatory Visit: Payer: Self-pay | Admitting: Student in an Organized Health Care Education/Training Program

## 2020-05-09 ENCOUNTER — Encounter: Payer: Self-pay | Admitting: Family Medicine

## 2020-05-09 DIAGNOSIS — Z1231 Encounter for screening mammogram for malignant neoplasm of breast: Secondary | ICD-10-CM | POA: Diagnosis not present

## 2020-05-15 ENCOUNTER — Other Ambulatory Visit: Payer: Self-pay | Admitting: Student in an Organized Health Care Education/Training Program

## 2020-05-15 DIAGNOSIS — K219 Gastro-esophageal reflux disease without esophagitis: Secondary | ICD-10-CM

## 2020-05-26 ENCOUNTER — Ambulatory Visit (INDEPENDENT_AMBULATORY_CARE_PROVIDER_SITE_OTHER): Payer: Medicare Other | Admitting: Family Medicine

## 2020-05-26 ENCOUNTER — Other Ambulatory Visit: Payer: Self-pay

## 2020-05-26 VITALS — BP 112/70 | HR 97 | Wt 191.2 lb

## 2020-05-26 DIAGNOSIS — L0291 Cutaneous abscess, unspecified: Secondary | ICD-10-CM

## 2020-05-26 DIAGNOSIS — L02415 Cutaneous abscess of right lower limb: Secondary | ICD-10-CM | POA: Diagnosis not present

## 2020-05-26 DIAGNOSIS — E1142 Type 2 diabetes mellitus with diabetic polyneuropathy: Secondary | ICD-10-CM | POA: Diagnosis not present

## 2020-05-26 LAB — POCT GLYCOSYLATED HEMOGLOBIN (HGB A1C): Hemoglobin A1C: 6.1 % — AB (ref 4.0–5.6)

## 2020-05-26 MED ORDER — DOXYCYCLINE HYCLATE 100 MG PO TABS: 100.0000 mg | ORAL_TABLET | Freq: Two times a day (BID) | ORAL | 0 refills | Status: AC

## 2020-05-26 NOTE — Patient Instructions (Addendum)
It was great seeing you today!  Please check-out at the front desk before leaving the clinic. I'd like to see you back in 6 months for recheck of a1c but if you need to be seen earlier than that for any new issues we're happy to fit you in, just give [place] a call!  Visit Remembers: - Stop by the pharmacy to pick up your antibiotics  - Continue to work on your healthy eating habits and incorporating exercise into your daily life. (see below) - Your goal is to have an A1c < 8  Today your a1c was 6.1.  You longer have diabetes however you are pre-diabetic.  - Medicine Changes: Stop taking metformin. We will recheck a1c in August   Diet Recommendations for Pre-diabetes  Carbohydrate includes starch, sugar, and fiber.  Of these, only sugar and starch raise blood glucose.  (Fiber is found in fruits, vegetables [especially skin, seeds, and stalks] and whole grains.)   Starchy (carb) foods: Bread, rice, pasta, potatoes, corn, cereal, grits, crackers, bagels, muffins, all baked goods.  (Fruit, milk, and yogurt also have carbohydrate, but most of these foods will not spike your blood sugar as most starchy foods will.)  A few fruits do cause high blood sugars; use small portions of bananas (limit to 1/2 at a time), grapes, watermelon, oranges, and most tropical fruits.   Protein foods: Meat, fish, poultry, eggs, dairy foods, and beans such as pinto and kidney beans (beans also provide carbohydrate).   1. Eat at least REAL 3 meals and 1-2 snacks per day. Never go more than 4-5 hours while awake without eating. Eat breakfast within the first hour of getting up.   2. Limit starchy foods to TWO per meal and ONE per snack. ONE portion of a starchy food is equal to the following:   - ONE slice of bread (or its equivalent, such as half of a hamburger bun).   - 1/2 cup of a "scoopable" starchy food such as potatoes or rice.   - 15 grams of Total Carbohydrate as shown on food label.   - Every 4 ounces of a sweet  drink (including fruit juice). 3. Include at every meal: a protein food, a carb food, and vegetables and/or fruit.   - Obtain twice the volume of veg's as protein or carbohydrate foods for both lunch and dinner.   - Fresh or frozen veg's are best.   - Keep frozen veg's on hand for a quick vegetable serving.       Regarding lab work today:  Due to recent changes in healthcare laws, you may see the results of your imaging and laboratory studies on MyChart before your doctor has had a chance to review them.  We understand that in some cases there may be results that are confusing or concerning to you. Not all laboratory results come back in the same time frame and your doctor may be waiting for multiple results in order to interpret others.  Please give [place] 72 hours in order for your doctor to thoroughly review all the results before contacting the office for clarification of your results. If everything is normal, you will get a letter in the mail or a message in My Chart. Please give [place] a call if you do not hear from [place] after 2 weeks.  Please bring all of your medications with you to each visit.    If you haven't already, sign up for My Chart to have easy access to your  labs results, and communication with your primary care physician.  Feel free to call with any questions or concerns at any time, at [phone].   Take care,  Dr. Rushie [facility]

## 2020-05-26 NOTE — Progress Notes (Signed)
   SUBJECTIVE:   CHIEF COMPLAINT / HPI:      [name] is a 75 y.o. female here for abscess on inner right thigh.   Abscess Patient reports right thigh wound that is getting bigger.  Denies drainage.  Area is painful.  She has tried warm compresses without relief.  She does wear incontinence pads.   Diabetes Mellitus  Patient taking Metformin and here for a1c recheck today. Denies missing any doses of DM medications and takes metformin. Denies increased thirst, hunger, or frequent urination.    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 Wound was explored with sterile swabs.  Wound care instructions were provided. The procedure was well tolerated without complications.     ASSESSMENT/PLAN:   T2DM (type 2 diabetes mellitus) Follows with Dr. Prince Rome who recommended stopping metformin if a1c remains well controlled.Prev a1c 6.3 and today is 6.1. Discontinue Metformin as patient is well below goal. Recheck in 6 months. Patient agrees with plan.  Abscess See procedure note above. Very little drainage from site. Will cover with  Doxycycline. Return precautions discussed.        Lyndee Hensen, DO PGY-2, [facility] 05/29/2020

## 2020-05-29 DIAGNOSIS — L0291 Cutaneous abscess, unspecified: Secondary | ICD-10-CM | POA: Insufficient documentation

## 2020-05-29 NOTE — Assessment & Plan Note (Signed)
Follows with Dr. Prince Rome who recommended stopping metformin if a1c remains well controlled.Prev a1c 6.3 and today is 6.1. Discontinue Metformin as patient is well below goal. Recheck in 6 months. Patient agrees with plan.

## 2020-05-29 NOTE — Assessment & Plan Note (Signed)
See procedure note above. Very little drainage from site. Will cover with Doxycycline. Return precautions discussed.

## 2020-06-12 ENCOUNTER — Ambulatory Visit: Payer: Medicare Other | Admitting: Student in an Organized Health Care Education/Training Program

## 2020-06-15 DIAGNOSIS — M5416 Radiculopathy, lumbar region: Secondary | ICD-10-CM | POA: Diagnosis not present

## 2020-06-19 ENCOUNTER — Other Ambulatory Visit: Payer: Self-pay | Admitting: Neurosurgery

## 2020-06-19 DIAGNOSIS — Z981 Arthrodesis status: Secondary | ICD-10-CM | POA: Diagnosis not present

## 2020-07-06 DIAGNOSIS — M5136 Other intervertebral disc degeneration, lumbar region: Secondary | ICD-10-CM | POA: Diagnosis not present

## 2020-07-06 DIAGNOSIS — M5416 Radiculopathy, lumbar region: Secondary | ICD-10-CM | POA: Diagnosis not present

## 2020-07-06 DIAGNOSIS — Z981 Arthrodesis status: Secondary | ICD-10-CM | POA: Diagnosis not present

## 2020-07-06 DIAGNOSIS — M47816 Spondylosis without myelopathy or radiculopathy, lumbar region: Secondary | ICD-10-CM | POA: Diagnosis not present

## 2020-07-10 ENCOUNTER — Ambulatory Visit
Admission: RE | Admit: 2020-07-10 | Discharge: 2020-07-10 | Disposition: A | Discharge status: Home / Self Care | Payer: Medicare Other | Source: Ambulatory Visit | Attending: Neurosurgery | Admitting: Neurosurgery

## 2020-07-10 ENCOUNTER — Other Ambulatory Visit: Payer: Self-pay

## 2020-07-10 ENCOUNTER — Other Ambulatory Visit: Payer: Self-pay | Admitting: Student in an Organized Health Care Education/Training Program

## 2020-07-10 DIAGNOSIS — M48061 Spinal stenosis, lumbar region without neurogenic claudication: Secondary | ICD-10-CM | POA: Diagnosis not present

## 2020-07-10 DIAGNOSIS — M5126 Other intervertebral disc displacement, lumbar region: Secondary | ICD-10-CM | POA: Diagnosis not present

## 2020-07-10 DIAGNOSIS — I10 Essential (primary) hypertension: Secondary | ICD-10-CM

## 2020-07-10 DIAGNOSIS — M47817 Spondylosis without myelopathy or radiculopathy, lumbosacral region: Secondary | ICD-10-CM | POA: Diagnosis not present

## 2020-07-10 DIAGNOSIS — M47816 Spondylosis without myelopathy or radiculopathy, lumbar region: Secondary | ICD-10-CM | POA: Diagnosis not present

## 2020-07-10 DIAGNOSIS — Z981 Arthrodesis status: Secondary | ICD-10-CM

## 2020-07-10 MED ORDER — GADOBENATE DIMEGLUMINE 529 MG/ML IV SOLN
17.0000 mL | Freq: Once | INTRAVENOUS | Status: AC | PRN
  Administered 2020-07-10: 17 mL via INTRAVENOUS

## 2020-07-12 DIAGNOSIS — E1140 Type 2 diabetes mellitus with diabetic neuropathy, unspecified: Secondary | ICD-10-CM | POA: Diagnosis not present

## 2020-07-13 DIAGNOSIS — M47816 Spondylosis without myelopathy or radiculopathy, lumbar region: Secondary | ICD-10-CM | POA: Diagnosis not present

## 2020-07-13 DIAGNOSIS — I10 Essential (primary) hypertension: Secondary | ICD-10-CM | POA: Diagnosis not present

## 2020-07-13 DIAGNOSIS — Z981 Arthrodesis status: Secondary | ICD-10-CM | POA: Diagnosis not present

## 2020-07-24 ENCOUNTER — Ambulatory Visit: Payer: Medicare Other | Admitting: Podiatry

## 2020-08-01 ENCOUNTER — Other Ambulatory Visit: Payer: Self-pay | Admitting: Student in an Organized Health Care Education/Training Program

## 2020-08-02 ENCOUNTER — Ambulatory Visit (INDEPENDENT_AMBULATORY_CARE_PROVIDER_SITE_OTHER): Payer: Medicare Other | Admitting: Podiatry

## 2020-08-02 ENCOUNTER — Other Ambulatory Visit: Payer: Self-pay

## 2020-08-02 DIAGNOSIS — G5761 Lesion of plantar nerve, right lower limb: Secondary | ICD-10-CM

## 2020-08-02 DIAGNOSIS — M659 Synovitis and tenosynovitis, unspecified: Secondary | ICD-10-CM

## 2020-08-02 MED ORDER — TRAMADOL HCL 50 MG PO TABS: 50.0000 mg | ORAL_TABLET | Freq: Four times a day (QID) | ORAL | 0 refills | Status: AC | PRN

## 2020-08-07 ENCOUNTER — Ambulatory Visit (INDEPENDENT_AMBULATORY_CARE_PROVIDER_SITE_OTHER): Payer: Medicare Other | Admitting: *Deleted

## 2020-08-07 ENCOUNTER — Other Ambulatory Visit: Payer: Self-pay

## 2020-08-07 DIAGNOSIS — G5763 Lesion of plantar nerve, bilateral lower limbs: Secondary | ICD-10-CM

## 2020-08-07 DIAGNOSIS — E0843 Diabetes mellitus due to underlying condition with diabetic autonomic (poly)neuropathy: Secondary | ICD-10-CM

## 2020-08-07 DIAGNOSIS — M21769 Unequal limb length (acquired), unspecified tibia and fibula: Secondary | ICD-10-CM

## 2020-08-07 NOTE — Progress Notes (Signed)
Patient presents to the office today for diabetic shoe and insole measuring.  Patient was measured with brannock device to determine size and width for 1 pair of extra depth shoes and foam casted for 3 pair of insoles.   Documentation of medical necessity will be sent to patient's treating diabetic doctor to verify and sign.   Patient's diabetic provider: Dr. Ermalene Searing  Shoes and insoles will be ordered at that time and patient will be notified for an appointment for fitting when they arrive.   Shoe size (per patient): 8 RIGHT, 10 LEFT   Brannock measurement: 8 RIGHT, 9.5 LEFT  Patient shoe selection-   1st choice:   Apex A400W  2nd choice:  Apex A730W  Shoe size ordered: Women's 9.5 Wide  Patient does have a drop foot brace in the right shoe.

## 2020-08-11 ENCOUNTER — Other Ambulatory Visit: Payer: Self-pay | Admitting: Student in an Organized Health Care Education/Training Program

## 2020-08-11 DIAGNOSIS — K219 Gastro-esophageal reflux disease without esophagitis: Secondary | ICD-10-CM

## 2020-08-15 DIAGNOSIS — G5761 Lesion of plantar nerve, right lower limb: Secondary | ICD-10-CM | POA: Diagnosis not present

## 2020-08-15 DIAGNOSIS — M659 Synovitis and tenosynovitis, unspecified: Secondary | ICD-10-CM | POA: Diagnosis not present

## 2020-08-15 MED ORDER — BETAMETHASONE SOD PHOS & ACET 6 (3-3) MG/ML IJ SUSP
3.0000 mg | Freq: Once | INTRAMUSCULAR | Status: AC
  Administered 2020-08-15: 3 mg via INTRA_ARTICULAR

## 2020-08-15 NOTE — Progress Notes (Signed)
HPI: 75 year old female with past medical history of diabetes mellitus presenting today for evaluation of a Morton's neuroma to the right fourth interspace.  She does have a history of Morton's neuromas and injections have helped significantly in the past.  Patient also states that she has a new complaint regarding left ankle pain.  This is been ongoing for a few months now.  She denies a history of injury.  She has had intermittent ankle pain in the past.  Again, she states that injections of helped significantly reduce her pain and symptoms.  She presents for further treatment and evaluation  Past Medical History:  Diagnosis Date  . Arthritis    osteoarthritis  . Carpal tunnel syndrome on both sides    Followed by Dr. Burney Gauze  . Cataracts, bilateral    immature  . Chronic back pain    stenosis  . DDD (degenerative disc disease)    Lumbar Spondylosis  . Diabetes mellitus    takes Metformin daily  . GERD (gastroesophageal reflux disease)    takes Omeprazole daily  . Glaucoma   . Hyperlipidemia    takes Atorvastatin daily  . Hypertension    takes Ramipril daily as well as HCTZ  . Muscle spasm    takes Baclofen as needed  . 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 Peripheral neuropathy    takes Gabapentin daily  . 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 Vitamin D deficiency    takes Vit D daily     Physical Exam: General: The patient is alert and oriented x3 in no acute distress.  Dermatology: Skin is warm, dry and supple bilateral lower extremities. Negative for open lesions or macerations.  Vascular: Palpable pedal pulses bilaterally. No edema or erythema noted. Capillary refill within normal limits.  Neurological: Epicritic and protective threshold diminished bilaterally.   Musculoskeletal Exam: Sharp pain with palpation of the fourth interspace right and lateral compression of the metatarsal heads consistent with neuroma  bilateral. Positive Conley Canal sign with loadbearing of the forefoot.  Limb length discrepancy with right lower extremity shorter than the left lower extremity.  Pain on palpation to the left ankle joint noted as well Range of motion within normal limits to all pedal and ankle joints bilateral. Muscle strength 5/5 in all groups bilateral.      Assessment: 1. DM with peripheral neuropathy 2. 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 Limb length discrepancy, right shorter than left 4.  Synovitis/capsulitis left ankle  Plan of Care:  1. Patient evaluated. X-Rays reviewed.  2. Injection of 0.5 mLs Celestone Soluspan injected into the neuroma of the right foot and ankle joint left 3.  Patient has not had success with Lyrica or gabapentin for her peripheral neuropathy  4.  Continue DM shoes and insoles.  Continue AFO brace 5.  Prescription for tramadol 50 mg  6.  Return to clinic as needed.      Edrick Kins, DPM [facility]  Dr. Edrick Kins, DPM    [address], [place] [address]                Office [phone]  Fax [phone]

## 2020-08-20 ENCOUNTER — Other Ambulatory Visit: Payer: Self-pay | Admitting: Student in an Organized Health Care Education/Training Program

## 2020-08-31 ENCOUNTER — Other Ambulatory Visit: Payer: Self-pay | Admitting: Student in an Organized Health Care Education/Training Program

## 2020-09-11 ENCOUNTER — Other Ambulatory Visit: Payer: Self-pay | Admitting: Student in an Organized Health Care Education/Training Program

## 2020-09-14 ENCOUNTER — Other Ambulatory Visit: Payer: Self-pay | Admitting: Student in an Organized Health Care Education/Training Program

## 2020-09-18 DIAGNOSIS — M5416 Radiculopathy, lumbar region: Secondary | ICD-10-CM | POA: Diagnosis not present

## 2020-09-25 ENCOUNTER — Other Ambulatory Visit: Payer: Self-pay | Admitting: Student in an Organized Health Care Education/Training Program

## 2020-09-25 DIAGNOSIS — I10 Essential (primary) hypertension: Secondary | ICD-10-CM

## 2020-09-28 ENCOUNTER — Telehealth: Payer: Self-pay

## 2020-09-28 ENCOUNTER — Telehealth: Payer: Self-pay | Admitting: Podiatry

## 2020-09-28 NOTE — Telephone Encounter (Signed)
Patient calls nurse line regarding paperwork needed to be completed by PCP for orthopedic shoes.   Paperwork is in provider box for completion.   Talbot Grumbling, RN

## 2020-09-28 NOTE — Telephone Encounter (Signed)
Pt called asking about status of diabetic shoes. I explained we have not received the documents back from her doctor for the shoes yet. She was going to call the office.

## 2020-10-16 ENCOUNTER — Telehealth: Payer: Self-pay | Admitting: Podiatry

## 2020-10-16 ENCOUNTER — Other Ambulatory Visit: Payer: Self-pay | Admitting: Student in an Organized Health Care Education/Training Program

## 2020-10-16 DIAGNOSIS — E1140 Type 2 diabetes mellitus with diabetic neuropathy, unspecified: Secondary | ICD-10-CM | POA: Diagnosis not present

## 2020-10-16 DIAGNOSIS — K219 Gastro-esophageal reflux disease without esophagitis: Secondary | ICD-10-CM

## 2020-10-16 NOTE — Telephone Encounter (Signed)
Pt left message on 7.6 when I was out of the office asking if we had received the needed documents from Dr Ouida Sills.  I returned call and told pt we did receive it and that the inserts are in production and we should receive them in a few weeks.

## 2020-10-16 NOTE — Telephone Encounter (Signed)
Patient calls nurse line checking on the status of diabetic shoes. I can not tell if paperwork was ever completed by previous PCP and nothing was scanned into the chart. I have LV with [facility] to fax over another form for PCP to fill out.

## 2020-10-19 DIAGNOSIS — Z981 Arthrodesis status: Secondary | ICD-10-CM | POA: Diagnosis not present

## 2020-10-19 DIAGNOSIS — M5416 Radiculopathy, lumbar region: Secondary | ICD-10-CM | POA: Diagnosis not present

## 2020-10-19 DIAGNOSIS — I10 Essential (primary) hypertension: Secondary | ICD-10-CM | POA: Diagnosis not present

## 2020-10-31 DIAGNOSIS — Z79899 Other long term (current) drug therapy: Secondary | ICD-10-CM | POA: Diagnosis not present

## 2020-10-31 DIAGNOSIS — M899 Disorder of bone, unspecified: Secondary | ICD-10-CM | POA: Diagnosis not present

## 2020-11-01 ENCOUNTER — Telehealth: Payer: Self-pay | Admitting: Podiatry

## 2020-11-01 NOTE — Telephone Encounter (Signed)
Pt left message checking on diabetic shoes.  I returned call and apologized for the delay but I have message the company to see when they are coming in. The manufacturer has had a delay due to moving of there warehouse. But I  told pt I would call when they come in.Marland KitchenMarland Kitchen

## 2020-11-02 DIAGNOSIS — H401121 Primary open-angle glaucoma, left eye, mild stage: Secondary | ICD-10-CM | POA: Diagnosis not present

## 2020-11-02 DIAGNOSIS — H401113 Primary open-angle glaucoma, right eye, severe stage: Secondary | ICD-10-CM | POA: Diagnosis not present

## 2020-11-10 ENCOUNTER — Other Ambulatory Visit: Payer: Self-pay | Admitting: Student in an Organized Health Care Education/Training Program

## 2020-11-17 ENCOUNTER — Other Ambulatory Visit: Payer: Self-pay | Admitting: Student in an Organized Health Care Education/Training Program

## 2020-11-23 ENCOUNTER — Ambulatory Visit (INDEPENDENT_AMBULATORY_CARE_PROVIDER_SITE_OTHER): Payer: Medicare Other | Admitting: *Deleted

## 2020-11-23 ENCOUNTER — Other Ambulatory Visit: Payer: Self-pay

## 2020-11-23 DIAGNOSIS — E0843 Diabetes mellitus due to underlying condition with diabetic autonomic (poly)neuropathy: Secondary | ICD-10-CM | POA: Diagnosis not present

## 2020-11-23 DIAGNOSIS — M659 Synovitis and tenosynovitis, unspecified: Secondary | ICD-10-CM

## 2020-11-23 DIAGNOSIS — M21769 Unequal limb length (acquired), unspecified tibia and fibula: Secondary | ICD-10-CM | POA: Diagnosis not present

## 2020-11-23 NOTE — Progress Notes (Signed)
Patient presents today to pick up diabetic shoes and insoles.  Patient was dispensed 1 pair of diabetic shoes and 3 pairs of foam casted diabetic insoles. Fit was satisfactory. Instructions for break-in and wear was reviewed and a copy was given to the patient.   Re-appointment for regularly scheduled diabetic foot care visits or if they should experience any trouble with the shoes or insoles.   She typically wears a size 8 on the right and size 10 on the left. The shoes ordered were a size 9.5. She does wear a drop foot brace in the right shoe as well. I did have to modify the right shoe for a better fit. I placed padding under the tongue and at the heel. She says the fit was better after the modifications.

## 2021-05-07 ENCOUNTER — Other Ambulatory Visit: Payer: Self-pay | Admitting: Student

## 2021-05-07 DIAGNOSIS — E2839 Other primary ovarian failure: Secondary | ICD-10-CM

## 2021-07-31 ENCOUNTER — Ambulatory Visit
Admission: RE | Admit: 2021-07-31 | Discharge: 2021-07-31 | Disposition: A | Discharge status: Home / Self Care | Payer: Medicare Other | Source: Ambulatory Visit | Attending: Student | Admitting: Student

## 2021-07-31 ENCOUNTER — Other Ambulatory Visit: Payer: Self-pay | Admitting: Student

## 2021-07-31 DIAGNOSIS — R109 Unspecified abdominal pain: Secondary | ICD-10-CM

## 2021-09-10 ENCOUNTER — Ambulatory Visit (INDEPENDENT_AMBULATORY_CARE_PROVIDER_SITE_OTHER): Payer: Medicare Other | Admitting: Podiatry

## 2021-09-10 DIAGNOSIS — M21769 Unequal limb length (acquired), unspecified tibia and fibula: Secondary | ICD-10-CM

## 2021-09-10 DIAGNOSIS — M659 Synovitis and tenosynovitis, unspecified: Secondary | ICD-10-CM

## 2021-09-10 DIAGNOSIS — E0843 Diabetes mellitus due to underlying condition with diabetic autonomic (poly)neuropathy: Secondary | ICD-10-CM | POA: Diagnosis not present

## 2021-09-10 DIAGNOSIS — G5761 Lesion of plantar nerve, right lower limb: Secondary | ICD-10-CM | POA: Diagnosis not present

## 2021-09-10 MED ORDER — BETAMETHASONE SOD PHOS & ACET 6 (3-3) MG/ML IJ SUSP: 3.0000 mg | Freq: Once | INTRAMUSCULAR | Status: DC

## 2021-09-10 NOTE — Progress Notes (Signed)
   HPI: 76 year old female with past medical history of diabetes mellitus presenting today for follow-up evaluation of a Morton's neuroma to the right fourth interspace as well as left ankle pain.  Patient states that the injection she received last visit on 08/02/2020 helped significantly.  She is requesting injections today.   Past Medical History:  Diagnosis Date   Arthritis    osteoarthritis   Carpal tunnel syndrome on both sides    Followed by Dr. 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  Dermatology: Skin is warm, dry and supple bilateral lower extremities. Negative for open lesions or macerations.  Vascular: Palpable pedal pulses bilaterally. No edema or erythema noted. Capillary refill within normal limits.  Neurological: Epicritic and protective threshold diminished bilaterally.   Musculoskeletal Exam: Sharp pain with palpation of the fourth interspace right and lateral compression of the metatarsal heads consistent with neuroma bilateral. Positive Conley Canal sign with loadbearing of the forefoot.  Limb length discrepancy with right lower extremity shorter than the left lower extremity.  Pain on palpation to the left ankle joint noted as well Range of motion within normal limits to all pedal and ankle joints bilateral.  Muscle strength 5/5 in all groups bilateral.      Assessment: 1. DM with peripheral neuropathy 2. 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 Limb length discrepancy, right shorter than left 4.  Synovitis/capsulitis left ankle  Plan of Care:  1. Patient evaluated. X-Rays reviewed.  2. Injection of 0.5 mLs Celestone Soluspan injected into the neuroma of the right foot and ankle joint left 3.  Patient has not had success with Lyrica or gabapentin for her peripheral neuropathy  4.  Continue DM shoes and insoles.  Continue AFO bracing 5.  Continue tramadol 50 mg as needed 6.  Return to clinic as needed.      Edrick Kins, DPM [facility]  Dr. Edrick Kins, DPM    [address], [place] [address]                Office [phone]  Fax [phone]

## 2021-09-11 ENCOUNTER — Encounter: Payer: Self-pay | Admitting: *Deleted

## 2021-09-23 ENCOUNTER — Other Ambulatory Visit: Payer: Self-pay | Admitting: Family Medicine

## 2021-10-16 ENCOUNTER — Ambulatory Visit
Admission: RE | Admit: 2021-10-16 | Discharge: 2021-10-16 | Disposition: A | Discharge status: Home / Self Care | Payer: Medicare Other | Source: Ambulatory Visit | Attending: Student | Admitting: Student

## 2021-10-16 DIAGNOSIS — E2839 Other primary ovarian failure: Secondary | ICD-10-CM

## 2022-04-24 ENCOUNTER — Ambulatory Visit (INDEPENDENT_AMBULATORY_CARE_PROVIDER_SITE_OTHER): Payer: 59 | Admitting: Podiatry

## 2022-04-24 VITALS — BP 140/81 | HR 86

## 2022-04-24 DIAGNOSIS — L600 Ingrowing nail: Secondary | ICD-10-CM | POA: Diagnosis not present

## 2022-04-24 DIAGNOSIS — E119 Type 2 diabetes mellitus without complications: Secondary | ICD-10-CM

## 2022-04-24 NOTE — Patient Instructions (Signed)

## 2022-04-24 NOTE — Progress Notes (Signed)
   Chief Complaint  Patient presents with   Diabetes    Bilateral foot and toe pain injections today, Left hallux ingrown nail, diabetic shoe, A1c- 6.1    Subjective: Patient presents today for a new complaint and evaluation of pain to the medial border left great toe. Patient is concerned for possible ingrown nail.  It is very sensitive to touch.    Patient also requesting new diabetic shoe fitting's today.  Patient presents today for further treatment and evaluation.  Past Medical History:  Diagnosis Date   Arthritis    osteoarthritis   Carpal tunnel syndrome on both sides    Followed by Dr. 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  Medial border left great toe is tender with evidence of an ingrowing nail. Pain on palpation noted to the border of the nail fold. The remaining nails appear unremarkable at this time. There are no open sores, lesions.  Vascular: DP and PT pulses palpable.  No clinical evidence of vascular compromise  Neruologic: Grossly intact via light touch bilateral.  Musculoskeletal: No pedal deformity noted  Assesement: #1 Paronychia with ingrowing nail medial border left great toe #2 diabetes mellitus due to underlying condition with  diabetic autonomic neuropathy, unspecified whether long term insulin use  Plan of Care:  1. Patient evaluated.  Comprehensive diabetic foot exam performed today 2. Discussed treatment alternatives and plan of care. Explained nail avulsion procedure and post procedure course to patient. 3. Patient opted for permanent partial nail avulsion of the ingrown portion of the nail.  4. Prior to procedure, local anesthesia infiltration utilized using 3 ml of a 50:50 mixture of 2% plain lidocaine and 0.5% plain marcaine in a normal hallux block fashion and a betadine prep performed.  5. Partial permanent nail avulsion with chemical matrixectomy performed using [id number] applications of phenol followed by alcohol flush.  6. Light dressing applied.  Post care instructions provided 7.  Appointment with diabetic shoe department for fitting of new diabetic shoes and insoles 8.  Return to clinic 2 weeks.  Edrick Kins, DPM [facility]  Dr. Edrick Kins, DPM    [address], [place] [address]                Office [phone]  Fax [phone]

## 2022-05-03 ENCOUNTER — Other Ambulatory Visit: Payer: Self-pay | Admitting: Student

## 2022-05-03 ENCOUNTER — Ambulatory Visit
Admission: RE | Admit: 2022-05-03 | Discharge: 2022-05-03 | Disposition: A | Discharge status: Home / Self Care | Payer: 59 | Source: Ambulatory Visit | Attending: Student | Admitting: Student

## 2022-05-03 DIAGNOSIS — R0789 Other chest pain: Secondary | ICD-10-CM

## 2022-05-15 ENCOUNTER — Ambulatory Visit (INDEPENDENT_AMBULATORY_CARE_PROVIDER_SITE_OTHER): Payer: 59

## 2022-05-15 ENCOUNTER — Ambulatory Visit (INDEPENDENT_AMBULATORY_CARE_PROVIDER_SITE_OTHER): Payer: 59 | Admitting: Podiatry

## 2022-05-15 VITALS — BP 133/74 | HR 72

## 2022-05-15 DIAGNOSIS — L600 Ingrowing nail: Secondary | ICD-10-CM

## 2022-05-15 DIAGNOSIS — E0843 Diabetes mellitus due to underlying condition with diabetic autonomic (poly)neuropathy: Secondary | ICD-10-CM

## 2022-05-15 DIAGNOSIS — M659 Synovitis and tenosynovitis, unspecified: Secondary | ICD-10-CM

## 2022-05-15 DIAGNOSIS — M21769 Unequal limb length (acquired), unspecified tibia and fibula: Secondary | ICD-10-CM

## 2022-05-15 NOTE — Progress Notes (Signed)
   Chief Complaint  Patient presents with   Ingrown Toenail    Left hallux nail check     Subjective: 77 y.o. female presents today status post permanent nail avulsion procedure of the medial border of the left great toe that was performed on 04/24/2022.  Patient states that she has been soaking her foot and applying antibiotic cream as instructed.  Doing well.  No new complaints at this time.   Past Medical History:  Diagnosis Date   Arthritis    osteoarthritis   Carpal tunnel syndrome on both sides    Followed by Dr. 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  Skin is warm, dry and supple. Nail and respective nail fold appears to be healing appropriately.   Assessment: #1 s/p partial permanent nail matrixectomy medial border left great toe   Plan of care: #1 patient was evaluated  #2 light debridement of the periungual debris was performed to the border of the respective toe and nail plate using a tissue nipper. #3 patient is to return to clinic on a PRN basis.   Edrick Kins, DPM [facility]  Dr. Edrick Kins, DPM    [address], [place] [address]                Office [phone]  Fax [phone]

## 2022-05-15 NOTE — Progress Notes (Signed)
Patient presents to the office today for diabetic shoe and insole measuring.  Patient was measured with brannock device to determine size and width for 1 pair of extra depth shoes and foam casted for 3 pair of insoles.   ABN signed.   Documentation of medical necessity will be sent to patient's treating diabetic doctor to verify and sign.   Patient's diabetic provider: Okey Dupre A. Manuella Ghazi, MD  Shoes and insoles will be ordered at that time and patient will be notified for an appointment for fitting when they arrive.   Brannock measurement: 9.5 W  Patient shoe selection-   1st   Shoe choice:   [id number] [id number]  Shoe size ordered: 10 W

## 2022-06-17 ENCOUNTER — Encounter: Payer: Self-pay | Admitting: Podiatry

## 2022-06-18 ENCOUNTER — Encounter: Payer: Self-pay | Admitting: Podiatry

## 2022-07-31 ENCOUNTER — Other Ambulatory Visit: Payer: 59

## 2022-08-19 ENCOUNTER — Other Ambulatory Visit: Payer: 59

## 2022-09-02 ENCOUNTER — Encounter (HOSPITAL_BASED_OUTPATIENT_CLINIC_OR_DEPARTMENT_OTHER): Payer: Self-pay | Admitting: Emergency Medicine

## 2022-09-02 ENCOUNTER — Other Ambulatory Visit: Payer: Self-pay

## 2022-09-02 ENCOUNTER — Emergency Department (HOSPITAL_BASED_OUTPATIENT_CLINIC_OR_DEPARTMENT_OTHER): Payer: 59

## 2022-09-02 ENCOUNTER — Emergency Department (HOSPITAL_BASED_OUTPATIENT_CLINIC_OR_DEPARTMENT_OTHER)
Admission: EM | Admit: 2022-09-02 | Discharge: 2022-09-02 | Disposition: A | Discharge status: Home / Self Care | Payer: 59 | Attending: Emergency Medicine | Admitting: Emergency Medicine

## 2022-09-02 DIAGNOSIS — R109 Unspecified abdominal pain: Secondary | ICD-10-CM | POA: Insufficient documentation

## 2022-09-02 DIAGNOSIS — I10 Essential (primary) hypertension: Secondary | ICD-10-CM | POA: Diagnosis not present

## 2022-09-02 DIAGNOSIS — M545 Low back pain, unspecified: Secondary | ICD-10-CM | POA: Diagnosis present

## 2022-09-02 DIAGNOSIS — E119 Type 2 diabetes mellitus without complications: Secondary | ICD-10-CM | POA: Diagnosis not present

## 2022-09-02 LAB — URINALYSIS, ROUTINE W REFLEX MICROSCOPIC
Bacteria, UA: NONE SEEN
Bilirubin Urine: NEGATIVE
Glucose, UA: NEGATIVE mg/dL
Hgb urine dipstick: NEGATIVE
Ketones, ur: NEGATIVE mg/dL
Nitrite: NEGATIVE
Specific Gravity, Urine: 1.021 (ref 1.005–1.030)
pH: 5.5 (ref 5.0–8.0)

## 2022-09-02 LAB — CBC
HCT: 43.2 % (ref 36.0–46.0)
Hemoglobin: 13.8 g/dL (ref 12.0–15.0)
MCH: 25.9 pg — ABNORMAL LOW (ref 26.0–34.0)
MCHC: 31.9 g/dL (ref 30.0–36.0)
MCV: 81.1 fL (ref 80.0–100.0)
Platelets: 276 10*3/uL (ref 150–400)
RBC: 5.33 MIL/uL — ABNORMAL HIGH (ref 3.87–5.11)
RDW: 14.8 % (ref 11.5–15.5)
WBC: 6.6 10*3/uL (ref 4.0–10.5)
nRBC: 0 % (ref 0.0–0.2)

## 2022-09-02 LAB — HEPATIC FUNCTION PANEL
ALT: 17 U/L (ref 0–44)
AST: 17 U/L (ref 15–41)
Albumin: 4.4 g/dL (ref 3.5–5.0)
Alkaline Phosphatase: 55 U/L (ref 38–126)
Bilirubin, Direct: 0.4 mg/dL — ABNORMAL HIGH (ref 0.0–0.2)
Indirect Bilirubin: 0.2 mg/dL — ABNORMAL LOW (ref 0.3–0.9)
Total Bilirubin: 0.6 mg/dL (ref 0.3–1.2)
Total Protein: 7.1 g/dL (ref 6.5–8.1)

## 2022-09-02 LAB — BASIC METABOLIC PANEL
Anion gap: 9 (ref 5–15)
BUN: 7 mg/dL — ABNORMAL LOW (ref 8–23)
CO2: 25 mmol/L (ref 22–32)
Calcium: 10 mg/dL (ref 8.9–10.3)
Chloride: 103 mmol/L (ref 98–111)
Creatinine, Ser: 0.64 mg/dL (ref 0.44–1.00)
GFR, Estimated: >60 mL/min (ref >60)
Glucose, Bld: 75 mg/dL (ref 70–99)
Potassium: 3.9 mmol/L (ref 3.5–5.1)
Sodium: 137 mmol/L (ref 135–145)

## 2022-09-02 LAB — LIPASE, BLOOD: Lipase: 20 U/L (ref 11–51)

## 2022-09-02 MED ORDER — MORPHINE SULFATE (PF) 4 MG/ML IV SOLN
4.0000 mg | Freq: Once | INTRAVENOUS | Status: AC
  Administered 2022-09-02: 4 mg via INTRAVENOUS
  Filled 2022-09-02: qty 1

## 2022-09-02 MED ORDER — METHOCARBAMOL 500 MG PO TABS: 500.0000 mg | ORAL_TABLET | Freq: Three times a day (TID) | ORAL | 0 refills | Status: AC | PRN

## 2022-09-02 MED ORDER — MORPHINE SULFATE (PF) 2 MG/ML IV SOLN
2.0000 mg | Freq: Once | INTRAVENOUS | Status: AC
  Administered 2022-09-02: 2 mg via INTRAVENOUS
  Filled 2022-09-02: qty 1

## 2022-09-02 MED ORDER — IBUPROFEN 600 MG PO TABS: 600.0000 mg | ORAL_TABLET | Freq: Three times a day (TID) | ORAL | 0 refills | Status: AC | PRN

## 2022-09-02 MED ORDER — KETOROLAC TROMETHAMINE 15 MG/ML IJ SOLN
15.0000 mg | Freq: Once | INTRAMUSCULAR | Status: AC
  Administered 2022-09-02: 15 mg via INTRAVENOUS
  Filled 2022-09-02: qty 1

## 2022-09-02 MED ORDER — CYCLOBENZAPRINE HCL 10 MG PO TABS
10.0000 mg | ORAL_TABLET | Freq: Once | ORAL | Status: AC
  Administered 2022-09-02: 10 mg via ORAL
  Filled 2022-09-02: qty 1

## 2022-09-02 MED ORDER — LIDOCAINE 5 % EX PTCH: 1.0000 | MEDICATED_PATCH | CUTANEOUS | 0 refills | Status: AC

## 2022-09-02 MED ORDER — IOHEXOL 300 MG/ML  SOLN
100.0000 mL | Freq: Once | INTRAMUSCULAR | Status: AC | PRN
Start: 2022-09-02 — End: 2022-09-02
  Administered 2022-09-02: 85 mL via INTRAVENOUS

## 2022-09-02 MED ORDER — OXYCODONE-ACETAMINOPHEN 5-325 MG PO TABS: 2.0000 | ORAL_TABLET | Freq: Once | ORAL | Status: DC

## 2022-09-02 MED ORDER — ONDANSETRON 4 MG PO TBDP
4.0000 mg | ORAL_TABLET | Freq: Once | ORAL | Status: AC
  Administered 2022-09-02: 4 mg via ORAL
  Filled 2022-09-02: qty 1

## 2022-09-02 NOTE — ED Notes (Signed)
This nurse called radiology concerning 2 hour wait on CT being resulted.

## 2022-09-02 NOTE — Discharge Instructions (Addendum)
Thank you for letting us care of you today.  Overall, your workup was reassuring.  We did not see any findings on your labs or imaging to explain your back pain.  It is likely musculoskeletal.  We gave you pain medication in the ED to help with this.  I am also prescribing multiple medications for you to take at home to help with this.  Please see exercises attached that may also help as tolerated.    The lining of your bladder was slightly thickened.  Follow up with PCP for outpatient ultrasound of bladder for better assessment of this.  I would also like you to be seen by your PCP this week to discuss if your back pain is improving or if you would benefit from physical therapy or further workup.  For any new or worsening symptoms, please return to the nearest ED for reevaluation.

## 2022-09-02 NOTE — ED Provider Notes (Signed)
[place] EMERGENCY DEPARTMENT AT [facility] Provider Note   CSN: [id number] Arrival date & time: 09/02/22  1408     History {Add pertinent medical, surgical, social history, OB history to HPI:1} No chief complaint on file.   [name] is a 77 y.o. female.  HPI     Home Medications Prior to Admission medications   Medication Sig Start Date End Date Taking? Authorizing Provider  ibuprofen (ADVIL) 600 MG tablet Take 1 tablet (600 mg total) by mouth every 8 (eight) hours as needed for up to 3 days for mild pain or moderate pain. 09/02/22 09/05/22 Yes Vashon Arch L, PA-C  lidocaine (LIDODERM) 5 % Place 1 patch onto the skin daily for 3 days. Remove & Discard patch within 12 hours or as directed by MD 09/02/22 09/05/22 Yes Doroteo Nickolson L, PA-C  methocarbamol (ROBAXIN) 500 MG tablet Take 1 tablet (500 mg total) by mouth every 8 (eight) hours as needed for up to 3 days for muscle spasms. 09/02/22 09/05/22 Yes Lowen Mansouri L, PA-C  aspirin 81 MG tablet Take 1 tablet (81 mg total) by mouth daily. 09/30/11   Floydene Flock, MD  atorvastatin (LIPITOR) 40 MG tablet TAKE 1 TABLET BY MOUTH  DAILY 08/31/20   Leeroy Bock, MD  brimonidine-timolol (COMBIGAN) 0.2-0.5 % ophthalmic solution Place 1 drop into both eyes every 12 (twelve) hours. 1 drop each eye every 12 hours 09/30/11   Floydene Flock, MD  calcium carbonate (OS-CAL) 600 MG TABS Take 1 tablet (600 mg total) by mouth daily. 09/30/11   Floydene Flock, MD  cholecalciferol (VITAMIN D) 1000 UNITS tablet Take 1 tablet (1,000 Units total) by mouth daily. 10/14/13   Erasmo Downer, MD  dorzolamide (TRUSOPT) 2 % ophthalmic solution Place 1 drop into the right eye 2 (two) times daily.    [provider]  dorzolamide (TRUSOPT) 2 % ophthalmic solution  08/24/18   [provider]  gabapentin (NEURONTIN) 600 MG tablet Take 1 tablet (600 mg total) by mouth 2 (two) times daily as needed. 08/22/20   Leeroy Bock, MD  hydrochlorothiazide (HYDRODIURIL) 25 MG tablet TAKE 1 TABLET BY MOUTH  DAILY 09/26/20   Leeroy Bock, MD  HYDROcodone-acetaminophen (NORCO) 10-325 MG tablet TAKE 1 TABLET BY ORAL ROUTE EVERY 8 HOURS AS NEEDED FOR PAIN, 30 DAY SUPPLY 07/08/18   [provider]  latanoprost (XALATAN) 0.005 % ophthalmic solution Place 1 drop into both eyes at bedtime.    [provider]  losartan (COZAAR) 25 MG tablet TAKE 1 TABLET BY MOUTH AT  BEDTIME 11/17/20   Sabino Dick, DO  metFORMIN (GLUCOPHAGE) 500 MG tablet TAKE 1 TABLET BY MOUTH  TWICE DAILY WITH A MEAL 11/13/20   Sabino Dick, DO  Multiple Vitamin (MULTIVITAMIN WITH MINERALS) TABS tablet Take 1 tablet by mouth daily.    [provider]  omeprazole (PRILOSEC) 20 MG capsule TAKE 1 CAPSULE BY MOUTH  TWICE DAILY AS NEEDED 10/16/20   Sabino Dick, DO  RHOPRESSA 0.02 % SOLN  08/27/18   [provider]  VYZULTA 0.024 % SOLN  07/11/18   [provider]      Allergies    Patient has no known allergies.    Review of Systems   Review of Systems  Physical Exam Updated Vital Signs BP (!) 153/76   Pulse 69   Temp 98.3 F (36.8 C) (Oral)   Resp 10   SpO2 96%  Physical Exam  ED  Results / Procedures / Treatments   Labs (all labs ordered are listed, but only abnormal results are displayed) Labs Reviewed  URINALYSIS, ROUTINE W REFLEX MICROSCOPIC - Abnormal; Notable for the following components:      Result Value   Protein, ur TRACE (*)    Leukocytes,Ua MODERATE (*)    All other components within normal limits  BASIC METABOLIC PANEL - Abnormal; Notable for the following components:   BUN 7 (*)    All other components within normal limits  CBC - Abnormal; Notable for the following components:   RBC 5.33 (*)    MCH 25.9 (*)    All other components within normal limits  HEPATIC FUNCTION PANEL - Abnormal; Notable for the following components:   Bilirubin, Direct 0.4 (*)     Indirect Bilirubin 0.2 (*)    All other components within normal limits  LIPASE, BLOOD    EKG None  Radiology No results found.  Procedures Procedures  {Document cardiac monitor, telemetry assessment procedure when appropriate:1}  Medications Ordered in ED Medications  morphine (PF) 2 MG/ML injection 2 mg (has no administration in time range)  ketorolac (TORADOL) 15 MG/ML injection 15 mg (has no administration in time range)  cyclobenzaprine (FLEXERIL) tablet 10 mg (has no administration in time range)  ondansetron (ZOFRAN-ODT) disintegrating tablet 4 mg (4 mg Oral Given 09/02/22 1737)  morphine (PF) 4 MG/ML injection 4 mg (4 mg Intravenous Given 09/02/22 1738)  iohexol (OMNIPAQUE) 300 MG/ML solution 100 mL (85 mLs Intravenous Contrast Given 09/02/22 1720)    ED Course/ Medical Decision Making/ A&P   {   Click here for ABCD2, HEART and other calculatorsREFRESH Note before signing :1}                          Medical Decision Making Amount and/or Complexity of Data Reviewed Labs: ordered. Radiology: ordered.  Risk Prescription drug management.   ***  {Document critical care time when appropriate:1} {Document review of labs and clinical decision tools ie heart score, Chads2Vasc2 etc:1}  {Document your independent review of radiology images, and any outside records:1} {Document your discussion with family members, caretakers, and with consultants:1} {Document social determinants of health affecting pt's care:1} {Document your decision making why or why not admission, treatments were needed:1} Final Clinical Impression(s) / ED Diagnoses Final diagnoses:  Acute bilateral low back pain without sciatica    Rx / DC Orders ED Discharge Orders          Ordered    methocarbamol (ROBAXIN) 500 MG tablet  Every 8 hours PRN        09/02/22 1953    ibuprofen (ADVIL) 600 MG tablet  Every 8 hours PRN        09/02/22 1953    lidocaine (LIDODERM) 5 %  Every 24 hours         09/02/22 1953

## 2022-09-02 NOTE — ED Triage Notes (Signed)
Patient with 4 days of lower/mid back pain. Urine is cloudy.

## 2022-09-04 ENCOUNTER — Other Ambulatory Visit: Payer: Self-pay | Admitting: Student

## 2022-09-04 DIAGNOSIS — N3289 Other specified disorders of bladder: Secondary | ICD-10-CM

## 2022-09-26 ENCOUNTER — Other Ambulatory Visit: Payer: 59

## 2022-09-27 ENCOUNTER — Ambulatory Visit
Admission: RE | Admit: 2022-09-27 | Discharge: 2022-09-27 | Disposition: A | Discharge status: Home / Self Care | Payer: 59 | Source: Ambulatory Visit | Attending: Student | Admitting: Student

## 2022-09-27 DIAGNOSIS — N3289 Other specified disorders of bladder: Secondary | ICD-10-CM

## 2023-01-20 ENCOUNTER — Encounter: Payer: Self-pay | Admitting: Podiatry

## 2023-01-20 ENCOUNTER — Ambulatory Visit (INDEPENDENT_AMBULATORY_CARE_PROVIDER_SITE_OTHER): Payer: 59 | Admitting: Podiatry

## 2023-01-20 VITALS — Ht 63.0 in | Wt 192.0 lb

## 2023-01-20 DIAGNOSIS — E119 Type 2 diabetes mellitus without complications: Secondary | ICD-10-CM | POA: Diagnosis not present

## 2023-01-20 DIAGNOSIS — L600 Ingrowing nail: Secondary | ICD-10-CM | POA: Diagnosis not present

## 2023-01-20 NOTE — Patient Instructions (Signed)

## 2023-01-20 NOTE — Progress Notes (Signed)
No chief complaint on file.   Subjective: Patient presents today for evaluation of pain to the medial border right great toe. Patient is concerned for possible ingrown nail.  It is very sensitive to touch.  Patient presents today for further treatment and evaluation.  Past Medical History:  Diagnosis Date   Arthritis    osteoarthritis   Carpal tunnel syndrome on both sides    Followed by Dr. 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   Peripheral neuropathy    takes Gabapentin daily   Polio osteopathy of lower leg (HCC) 1948   Vitamin D deficiency    takes Vit D daily    Past Surgical History:  Procedure Laterality Date   ABDOMINAL HYSTERECTOMY  1980   partial   BREAST BIOPSY Bilateral 2017,2018   both benign   CHOLECYSTECTOMY     COLONOSCOPY     HAND SURGERY     Right wrist, s/p plate removal in Feb 2012   JOINT REPLACEMENT  2006   left knee   left hand surgery     SPINE SURGERY  2018    No Known Allergies  Objective:  General: Well developed, nourished, in no acute distress, alert and oriented x3   Dermatology: Skin is warm, dry and supple bilateral.  Border right great toe there is tender with evidence of an ingrowing nail. Pain on palpation noted to the border of the nail fold. The remaining nails appear unremarkable at this time.   Vascular: DP and PT pulses palpable.  No clinical evidence of vascular compromise  Neruologic: Grossly intact via light touch bilateral.  Musculoskeletal: No pedal deformity  noted  Assesement: #1 Paronychia with ingrowing nail medial border right great toe #2 history of partial nail matricectomy medial border left great toe with routine healing  Plan of Care:  -Patient evaluated.  Comprehensive diabetic foot exam performed today -Discussed treatment alternatives and plan of care. Explained nail avulsion procedure and post procedure course to patient. -Patient opted for permanent partial nail avulsion of the ingrown portion of the nail.  -Prior to procedure, local anesthesia infiltration utilized using 3 ml of a 50:50 mixture of 2% plain lidocaine and 0.5% plain marcaine in a normal hallux block fashion and a betadine prep performed.  -Partial permanent nail avulsion with chemical matrixectomy performed using 3x30sec applications of phenol followed by alcohol flush.  -Light dressing applied.  Post care instructions provided -Return to clinic 3 weeks  Felecia Shelling, DPM [facility]  Dr. Felecia Shelling, DPM    [address] [place] [address]                Office [phone]  Fax [phone]

## 2023-02-10 ENCOUNTER — Encounter: Payer: Self-pay | Admitting: Podiatry

## 2023-02-10 ENCOUNTER — Ambulatory Visit (INDEPENDENT_AMBULATORY_CARE_PROVIDER_SITE_OTHER): Payer: 59 | Admitting: Podiatry

## 2023-02-10 DIAGNOSIS — M778 Other enthesopathies, not elsewhere classified: Secondary | ICD-10-CM | POA: Diagnosis not present

## 2023-02-10 DIAGNOSIS — L600 Ingrowing nail: Secondary | ICD-10-CM

## 2023-02-10 MED ORDER — BETAMETHASONE SOD PHOS & ACET 6 (3-3) MG/ML IJ SUSP
3.0000 mg | Freq: Once | INTRAMUSCULAR | Status: AC
  Administered 2023-02-10: 3 mg via INTRA_ARTICULAR

## 2023-02-10 NOTE — Progress Notes (Signed)
   Chief Complaint  Patient presents with   Routine Post Op    PATIENT STATES SHE IS HERE FOR A ROUTINE CHECK UP FOR AN INGROWN TOENAIL , PATIENT STATES SHE HAS NOT BEEN IN ANY PAIN LATELY , NO PAIN MEDICATION FOR PAIN SHE JUST HAVE BEEN USING OINTMENT  FOR HER FOOT     Subjective: 77 y.o. female presents today status post permanent nail avulsion procedure of the medial border right great toe that was performed on 01/20/2023.  Patient doing well.  Patient states that she has been experiencing a significant of burning and numbness to the dorsal aspect of the left foot extending to her toes.  It is most painful at night when she goes to bed.  It is very aggravating.  She has a long history of DDD and takes gabapentin 3 times daily.   Past Medical History:  Diagnosis Date   Arthritis    osteoarthritis   Carpal tunnel syndrome on both sides    Followed by Dr. 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  Skin is warm, dry and supple. Nail and respective nail fold appears to be healing appropriately.  There is some low-grade tenderness with palpation along the dorsum of the left foot extending into the toes.  Pulses are palpable.  Capillary refill WNL.  No edema or erythema noted  Assessment: #1 s/p partial permanent nail matrixectomy medial border right great toe 01/20/2023 #2  capsulitis left foot   Plan of care: -Patient was evaluated  -Light debridement of the periungual debris was performed to the border of the respective toe and nail plate using a tissue nipper. -Injection of 0.5 cc Celestone Soluspan injected around the left midfoot/midtarsal joint -Continue gabapentin as prescribed -Return to clinic as needed   Felecia Shelling, DPM [facility]  Dr. Felecia Shelling, DPM    [address] [place], [place] [address]                Office [phone]  Fax [phone]
